# Patient Record
Sex: Female | Born: 1965 | Race: Black or African American | Hispanic: No | Marital: Married | Smoking: Former smoker
Health system: Southern US, Community
[De-identification: ages and names within clinical notes are randomized; demographics above are authoritative.]

## PROBLEM LIST (undated history)

## (undated) DIAGNOSIS — M1990 Unspecified osteoarthritis, unspecified site: Secondary | ICD-10-CM

## (undated) DIAGNOSIS — K219 Gastro-esophageal reflux disease without esophagitis: Secondary | ICD-10-CM

## (undated) DIAGNOSIS — L259 Unspecified contact dermatitis, unspecified cause: Secondary | ICD-10-CM

## (undated) DIAGNOSIS — I10 Essential (primary) hypertension: Secondary | ICD-10-CM

## (undated) DIAGNOSIS — R7303 Prediabetes: Secondary | ICD-10-CM

## (undated) HISTORY — PX: CARPAL TUNNEL RELEASE: SHX101

## (undated) HISTORY — PX: BURN TREATMENT: SHX158

## (undated) HISTORY — DX: Gastro-esophageal reflux disease without esophagitis: K21.9

## (undated) HISTORY — DX: Unspecified contact dermatitis, unspecified cause: L25.9

## (undated) HISTORY — PX: EYE SURGERY: SHX253

---

## 1998-01-25 ENCOUNTER — Encounter: Admission: RE | Admit: 1998-01-25 | Discharge: 1998-01-25 | Payer: Self-pay | Admitting: Internal Medicine

## 1998-07-20 ENCOUNTER — Ambulatory Visit (HOSPITAL_BASED_OUTPATIENT_CLINIC_OR_DEPARTMENT_OTHER): Admission: RE | Admit: 1998-07-20 | Discharge: 1998-07-20 | Payer: Self-pay | Admitting: Ophthalmology

## 1998-07-30 ENCOUNTER — Encounter: Admission: RE | Admit: 1998-07-30 | Discharge: 1998-07-30 | Payer: Self-pay | Admitting: Internal Medicine

## 1998-08-29 ENCOUNTER — Encounter: Admission: RE | Admit: 1998-08-29 | Discharge: 1998-08-29 | Payer: Self-pay | Admitting: Internal Medicine

## 1998-11-04 ENCOUNTER — Emergency Department (HOSPITAL_COMMUNITY): Admission: EM | Admit: 1998-11-04 | Discharge: 1998-11-04 | Payer: Self-pay | Admitting: Emergency Medicine

## 1998-12-10 ENCOUNTER — Emergency Department (HOSPITAL_COMMUNITY): Admission: EM | Admit: 1998-12-10 | Discharge: 1998-12-10 | Payer: Self-pay | Admitting: Emergency Medicine

## 1998-12-10 ENCOUNTER — Encounter: Payer: Self-pay | Admitting: Emergency Medicine

## 1999-01-10 ENCOUNTER — Encounter: Admission: RE | Admit: 1999-01-10 | Discharge: 1999-01-10 | Payer: Self-pay | Admitting: Internal Medicine

## 1999-01-23 ENCOUNTER — Encounter: Admission: RE | Admit: 1999-01-23 | Discharge: 1999-01-23 | Payer: Self-pay | Admitting: Hematology and Oncology

## 1999-02-13 ENCOUNTER — Encounter: Admission: RE | Admit: 1999-02-13 | Discharge: 1999-02-13 | Payer: Self-pay | Admitting: Internal Medicine

## 1999-09-24 ENCOUNTER — Emergency Department (HOSPITAL_COMMUNITY): Admission: EM | Admit: 1999-09-24 | Discharge: 1999-09-24 | Payer: Self-pay | Admitting: Emergency Medicine

## 2000-04-25 ENCOUNTER — Emergency Department (HOSPITAL_COMMUNITY): Admission: EM | Admit: 2000-04-25 | Discharge: 2000-04-25 | Payer: Self-pay

## 2000-05-02 ENCOUNTER — Ambulatory Visit (HOSPITAL_COMMUNITY): Admission: RE | Admit: 2000-05-02 | Discharge: 2000-05-02 | Payer: Self-pay

## 2001-02-21 ENCOUNTER — Emergency Department (HOSPITAL_COMMUNITY): Admission: EM | Admit: 2001-02-21 | Discharge: 2001-02-21 | Payer: Self-pay | Admitting: Emergency Medicine

## 2001-02-21 ENCOUNTER — Encounter: Payer: Self-pay | Admitting: Emergency Medicine

## 2001-09-14 ENCOUNTER — Emergency Department (HOSPITAL_COMMUNITY): Admission: EM | Admit: 2001-09-14 | Discharge: 2001-09-14 | Payer: Self-pay | Admitting: Emergency Medicine

## 2001-09-14 ENCOUNTER — Encounter: Payer: Self-pay | Admitting: Emergency Medicine

## 2002-07-14 ENCOUNTER — Encounter: Admission: RE | Admit: 2002-07-14 | Discharge: 2002-07-14 | Payer: Self-pay | Admitting: Internal Medicine

## 2002-07-15 ENCOUNTER — Encounter: Admission: RE | Admit: 2002-07-15 | Discharge: 2002-07-15 | Payer: Self-pay | Admitting: Internal Medicine

## 2002-08-01 ENCOUNTER — Emergency Department (HOSPITAL_COMMUNITY): Admission: EM | Admit: 2002-08-01 | Discharge: 2002-08-01 | Payer: Self-pay | Admitting: *Deleted

## 2002-09-24 ENCOUNTER — Encounter: Payer: Self-pay | Admitting: Emergency Medicine

## 2002-09-24 ENCOUNTER — Emergency Department (HOSPITAL_COMMUNITY): Admission: EM | Admit: 2002-09-24 | Discharge: 2002-09-24 | Payer: Self-pay | Admitting: Emergency Medicine

## 2003-01-14 ENCOUNTER — Emergency Department (HOSPITAL_COMMUNITY): Admission: EM | Admit: 2003-01-14 | Discharge: 2003-01-15 | Payer: Self-pay | Admitting: Emergency Medicine

## 2003-02-01 ENCOUNTER — Encounter: Admission: RE | Admit: 2003-02-01 | Discharge: 2003-02-01 | Payer: Self-pay | Admitting: Internal Medicine

## 2003-06-20 ENCOUNTER — Encounter: Admission: RE | Admit: 2003-06-20 | Discharge: 2003-06-20 | Payer: Self-pay | Admitting: Internal Medicine

## 2003-08-04 ENCOUNTER — Encounter (INDEPENDENT_AMBULATORY_CARE_PROVIDER_SITE_OTHER): Payer: Self-pay | Admitting: *Deleted

## 2003-08-04 ENCOUNTER — Encounter: Admission: RE | Admit: 2003-08-04 | Discharge: 2003-08-04 | Payer: Self-pay | Admitting: Family Medicine

## 2003-10-20 ENCOUNTER — Emergency Department (HOSPITAL_COMMUNITY): Admission: EM | Admit: 2003-10-20 | Discharge: 2003-10-20 | Payer: Self-pay

## 2004-01-08 ENCOUNTER — Encounter: Admission: RE | Admit: 2004-01-08 | Discharge: 2004-01-08 | Payer: Self-pay | Admitting: Internal Medicine

## 2004-01-19 ENCOUNTER — Emergency Department (HOSPITAL_COMMUNITY): Admission: EM | Admit: 2004-01-19 | Discharge: 2004-01-19 | Payer: Self-pay | Admitting: Emergency Medicine

## 2004-02-07 ENCOUNTER — Emergency Department (HOSPITAL_COMMUNITY): Admission: EM | Admit: 2004-02-07 | Discharge: 2004-02-07 | Payer: Self-pay

## 2004-02-09 ENCOUNTER — Emergency Department (HOSPITAL_COMMUNITY): Admission: EM | Admit: 2004-02-09 | Discharge: 2004-02-09 | Payer: Self-pay | Admitting: Emergency Medicine

## 2004-02-12 ENCOUNTER — Emergency Department (HOSPITAL_COMMUNITY): Admission: EM | Admit: 2004-02-12 | Discharge: 2004-02-12 | Payer: Self-pay | Admitting: Emergency Medicine

## 2004-02-16 ENCOUNTER — Encounter: Admission: RE | Admit: 2004-02-16 | Discharge: 2004-02-16 | Payer: Self-pay | Admitting: Internal Medicine

## 2004-07-25 ENCOUNTER — Ambulatory Visit: Payer: Self-pay | Admitting: Internal Medicine

## 2004-08-19 ENCOUNTER — Ambulatory Visit: Payer: Self-pay | Admitting: Internal Medicine

## 2004-08-29 ENCOUNTER — Ambulatory Visit (HOSPITAL_COMMUNITY): Admission: RE | Admit: 2004-08-29 | Discharge: 2004-08-29 | Payer: Self-pay | Admitting: Internal Medicine

## 2004-09-21 ENCOUNTER — Emergency Department (HOSPITAL_COMMUNITY): Admission: EM | Admit: 2004-09-21 | Discharge: 2004-09-21 | Payer: Self-pay

## 2004-11-19 ENCOUNTER — Ambulatory Visit: Payer: Self-pay | Admitting: Internal Medicine

## 2004-12-04 ENCOUNTER — Ambulatory Visit: Payer: Self-pay | Admitting: Internal Medicine

## 2005-01-18 ENCOUNTER — Emergency Department (HOSPITAL_COMMUNITY): Admission: EM | Admit: 2005-01-18 | Discharge: 2005-01-18 | Payer: Self-pay | Admitting: Emergency Medicine

## 2005-04-18 IMAGING — US US ABDOMEN COMPLETE
1 series · 14 of 25 positions shown · non-contrast
Comparison: none

CLINICAL DATA: Abdominal pain.

[Series 1: unknown · 0.33mm/px · 14 of 59 slices shown]
[im 1/59]
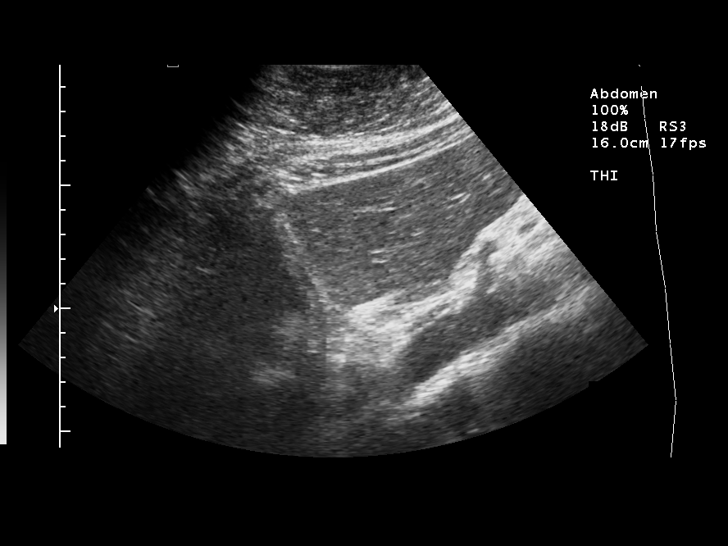
[im 5/59]
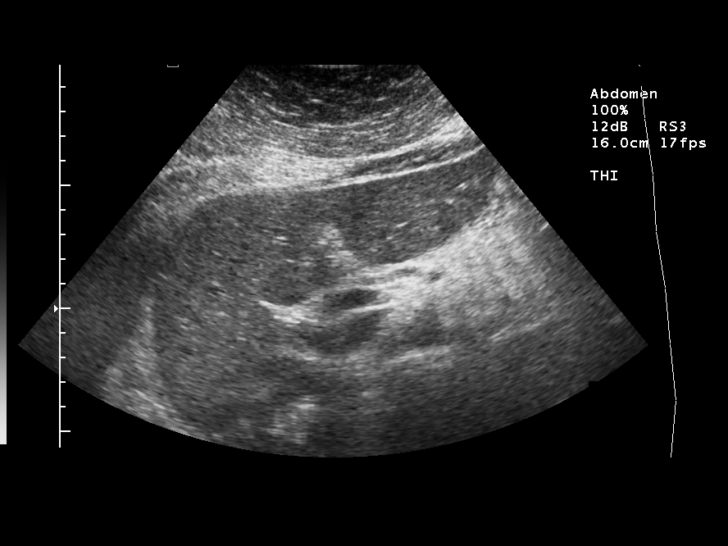
[im 10/59]
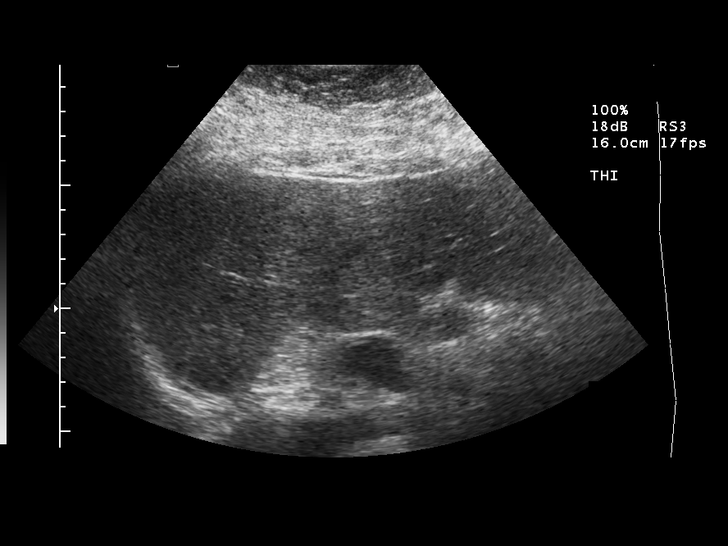
[im 15/59]
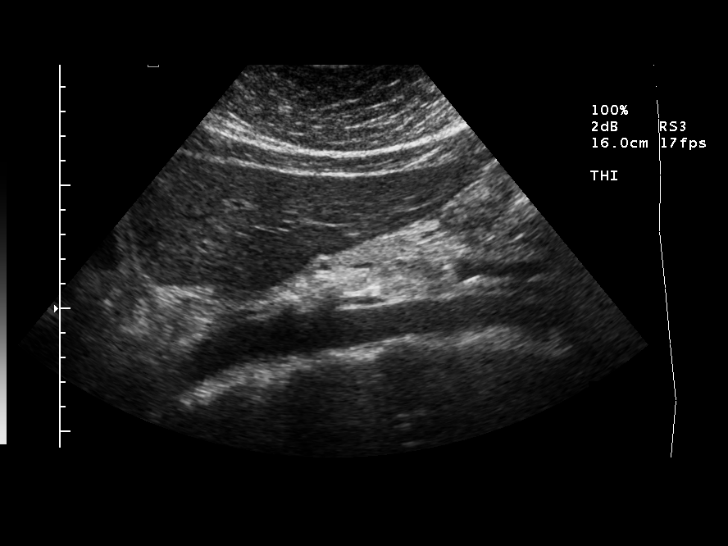
[im 20/59]
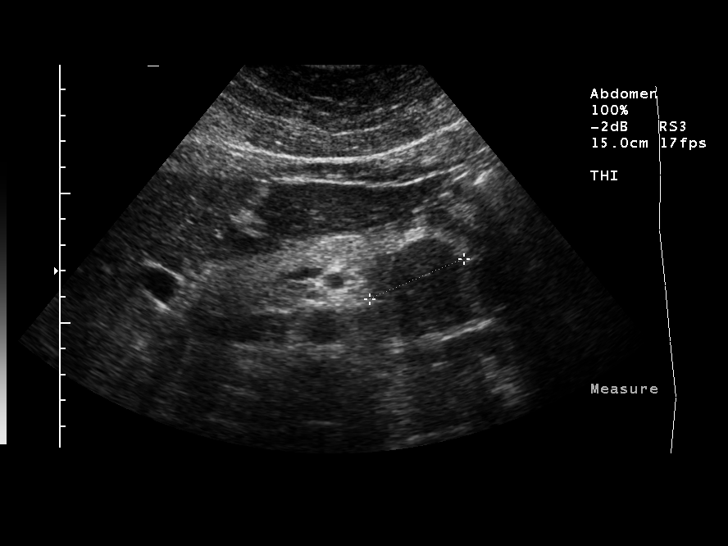
[im 22/59]
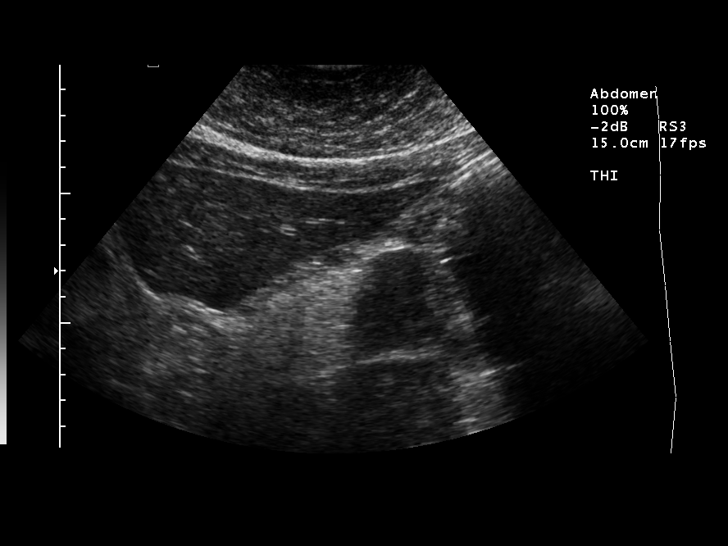
[im 27/59]
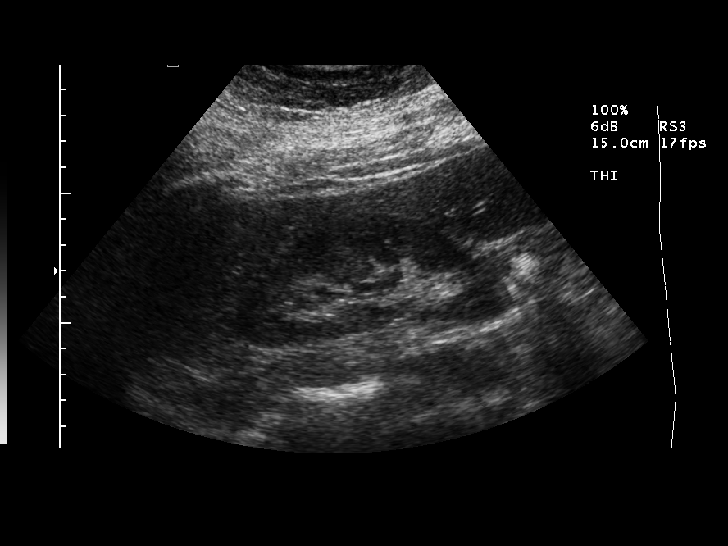
[im 32/59]
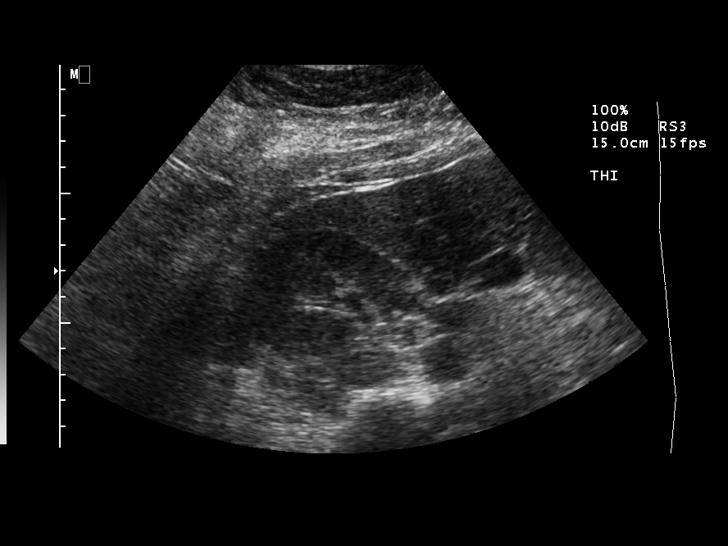
[im 37/59]
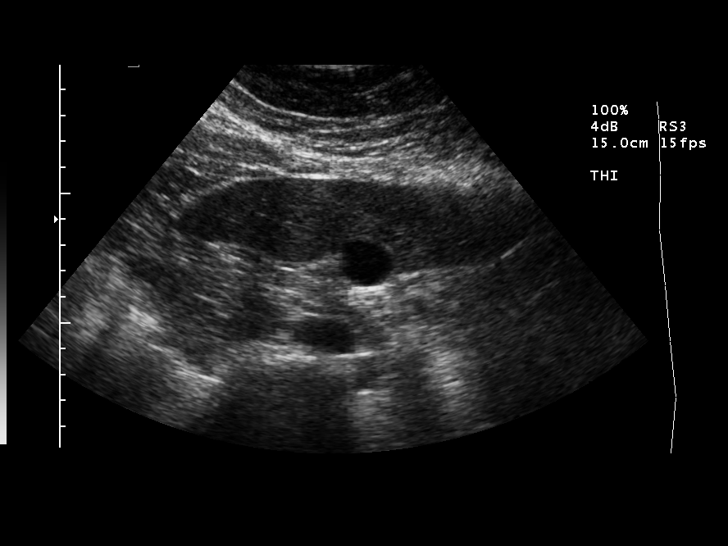
[im 39/59]
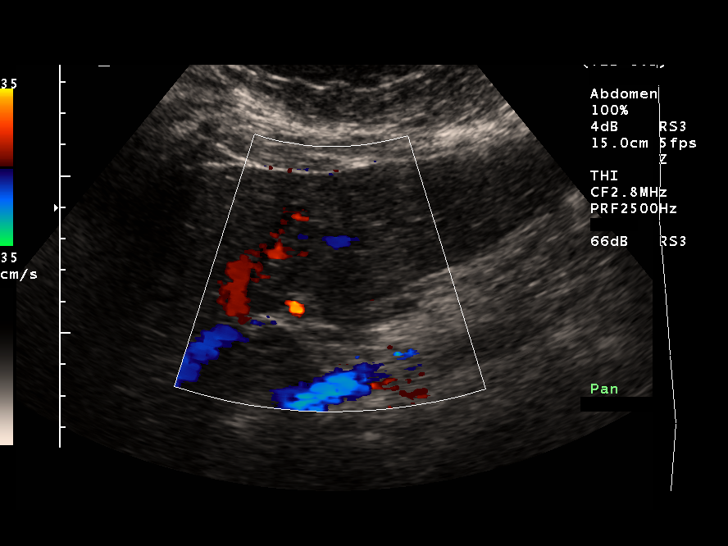
[im 44/59]
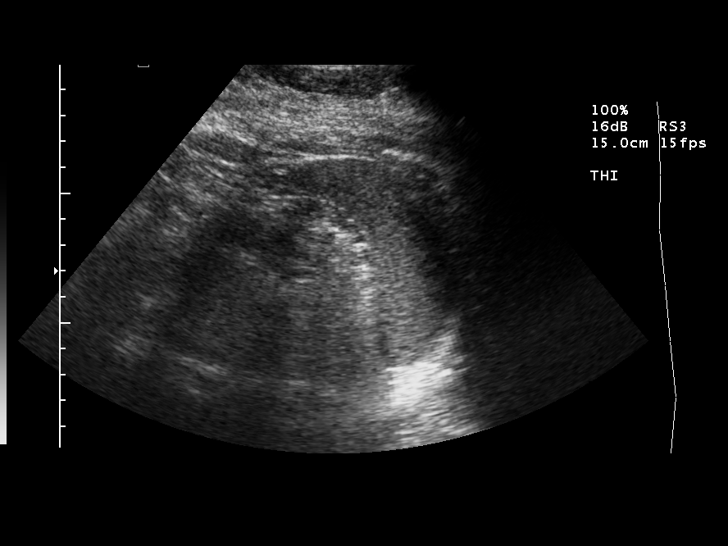
[im 49/59]
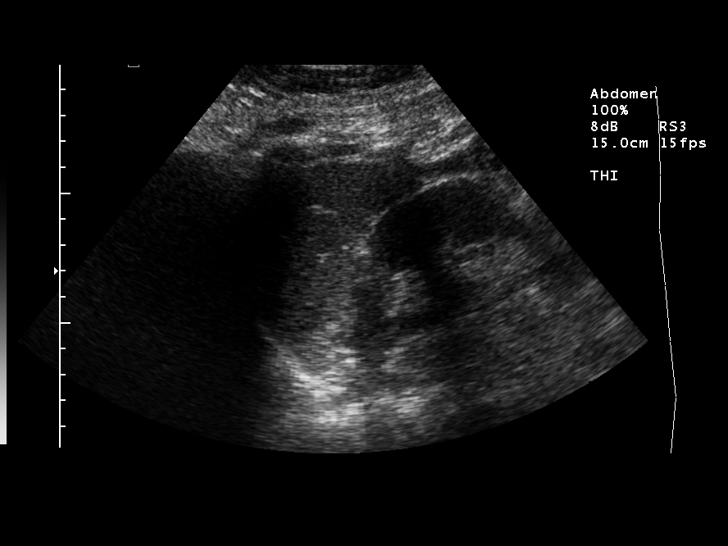
[im 54/59]
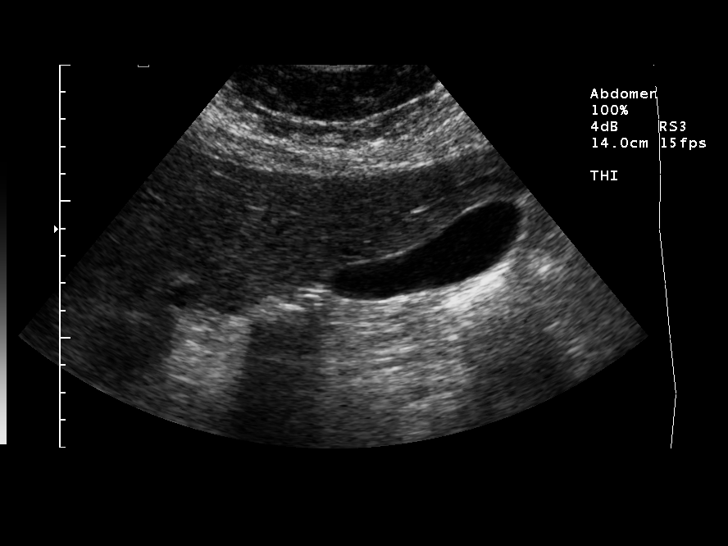
[im 59/59]
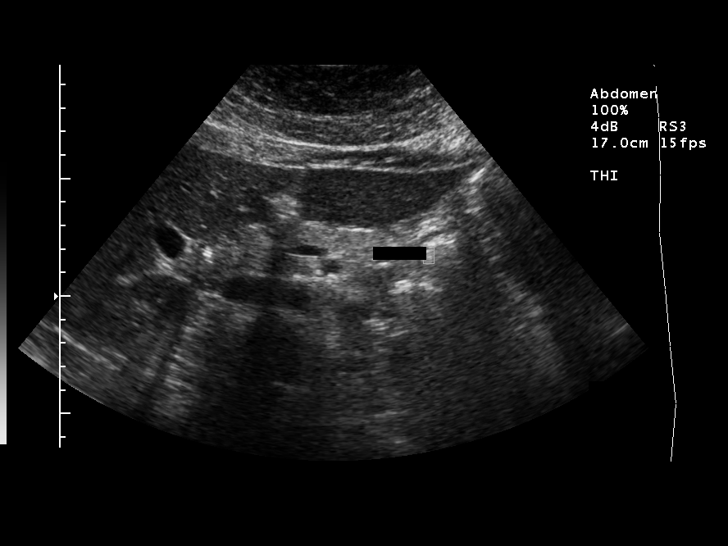

[14 of 25 positions shown; findings below may reference images not displayed]

ABDOMINAL ULTRASOUND:

 There is no evidence of gallstones or gallbladder wall thickening. There is no evidence of biliary ductal dilatation. The liver is within normal limits in echogenicity and no focal parenchymal lesions are identified. The visualized portion of the pancreas is unremarkable in appearance. 

 The kidneys are within normal limits in size and echogenicity and there is no evidence of masses or hydronephrosis.  There is no evidence of splenomegaly or abdominal aortic aneurysm.  Images of the inferior vena cava are unremarkable, and there is no evidence of ascites.
IMPRESSION: Negative abdominal ultrasound.

## 2005-05-11 IMAGING — CR DG KNEE COMPLETE 4+V*L*
4 series · 4 of 4 positions shown · non-contrast
Comparison: none

CLINICAL DATA: Fell last night and twisted the knee.  Pain in anterior aspect of the knee.
 LEFT KNEE COMPLETE:
 Four views of the left knee show no evidence of acute fracture, dislocation, or foreign body.  There is slight irregularity associated with the articular surface of the anterior aspect of the medial condyle and there is a suggestion of a small joint effusion.

[view not recorded (1 of 4)]
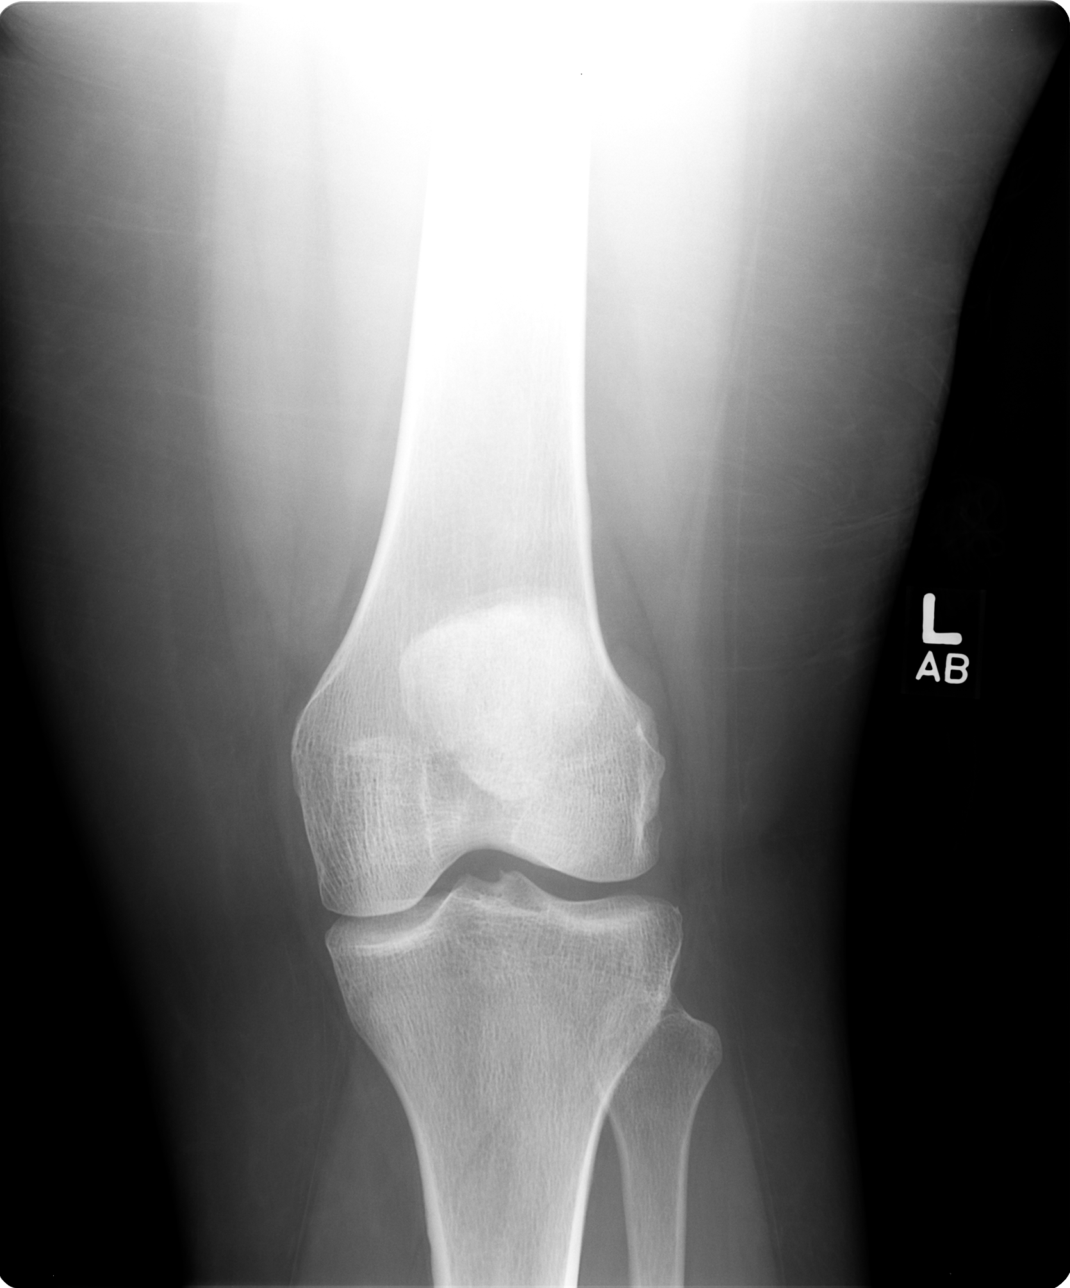

[view not recorded (2 of 4)]
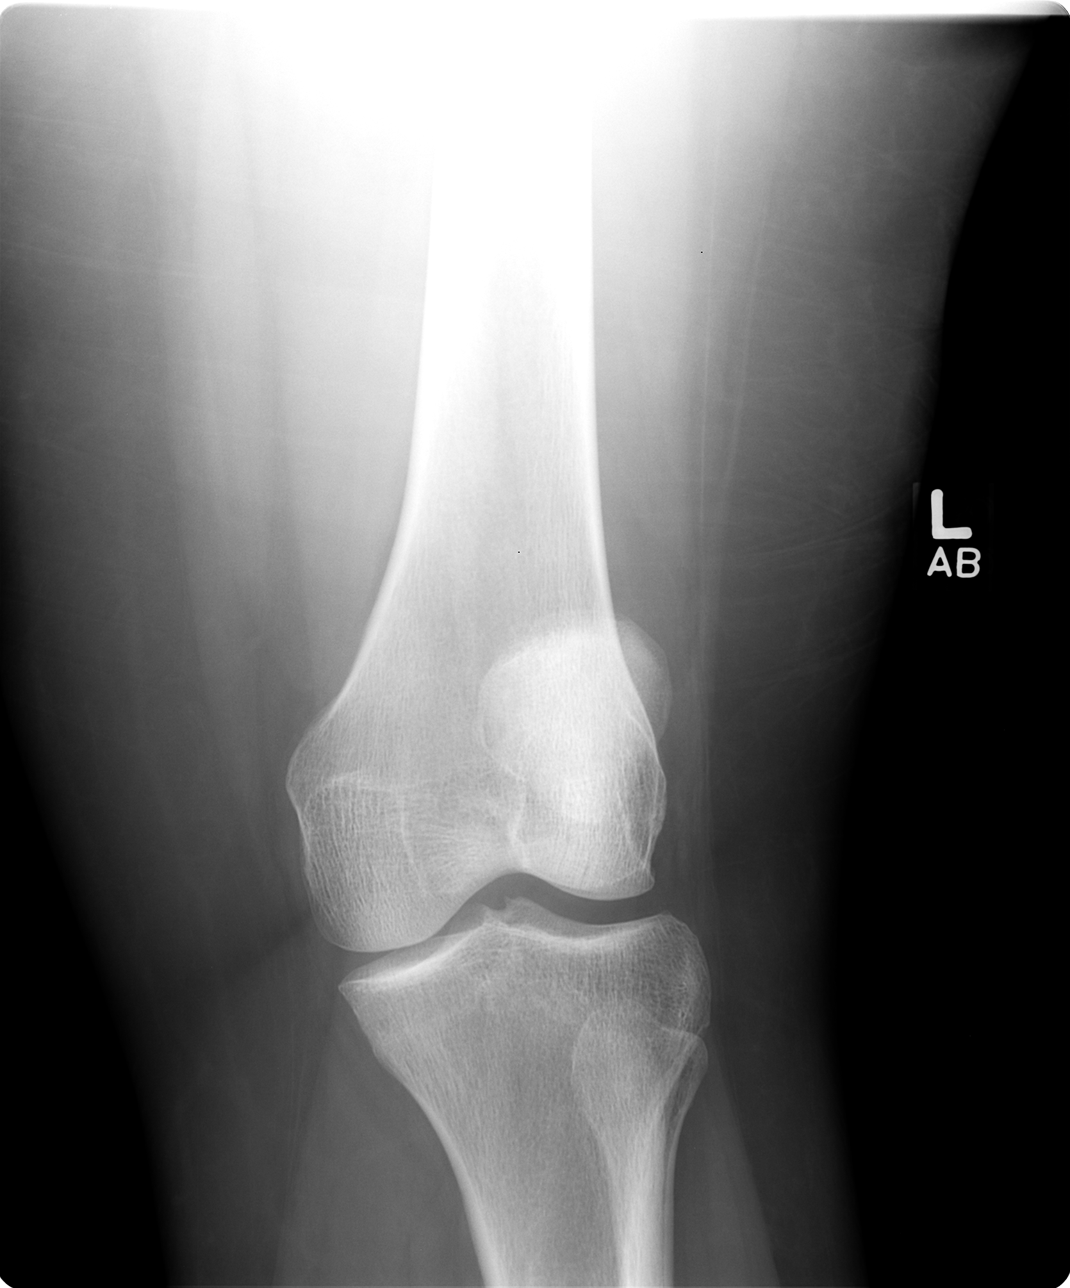

[view not recorded (3 of 4)]
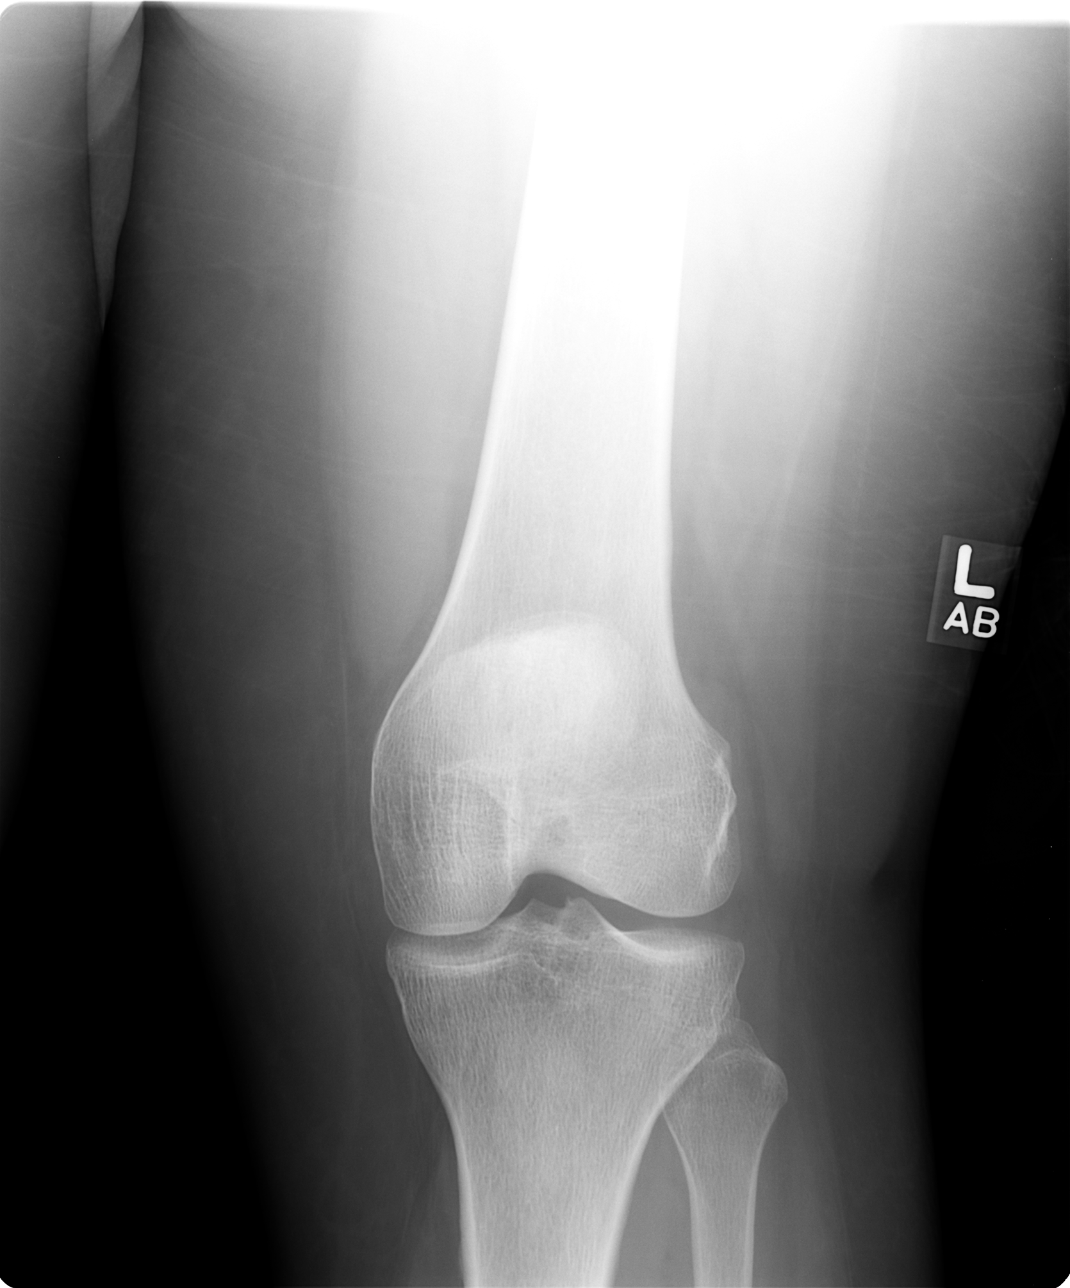

[view not recorded (4 of 4)]
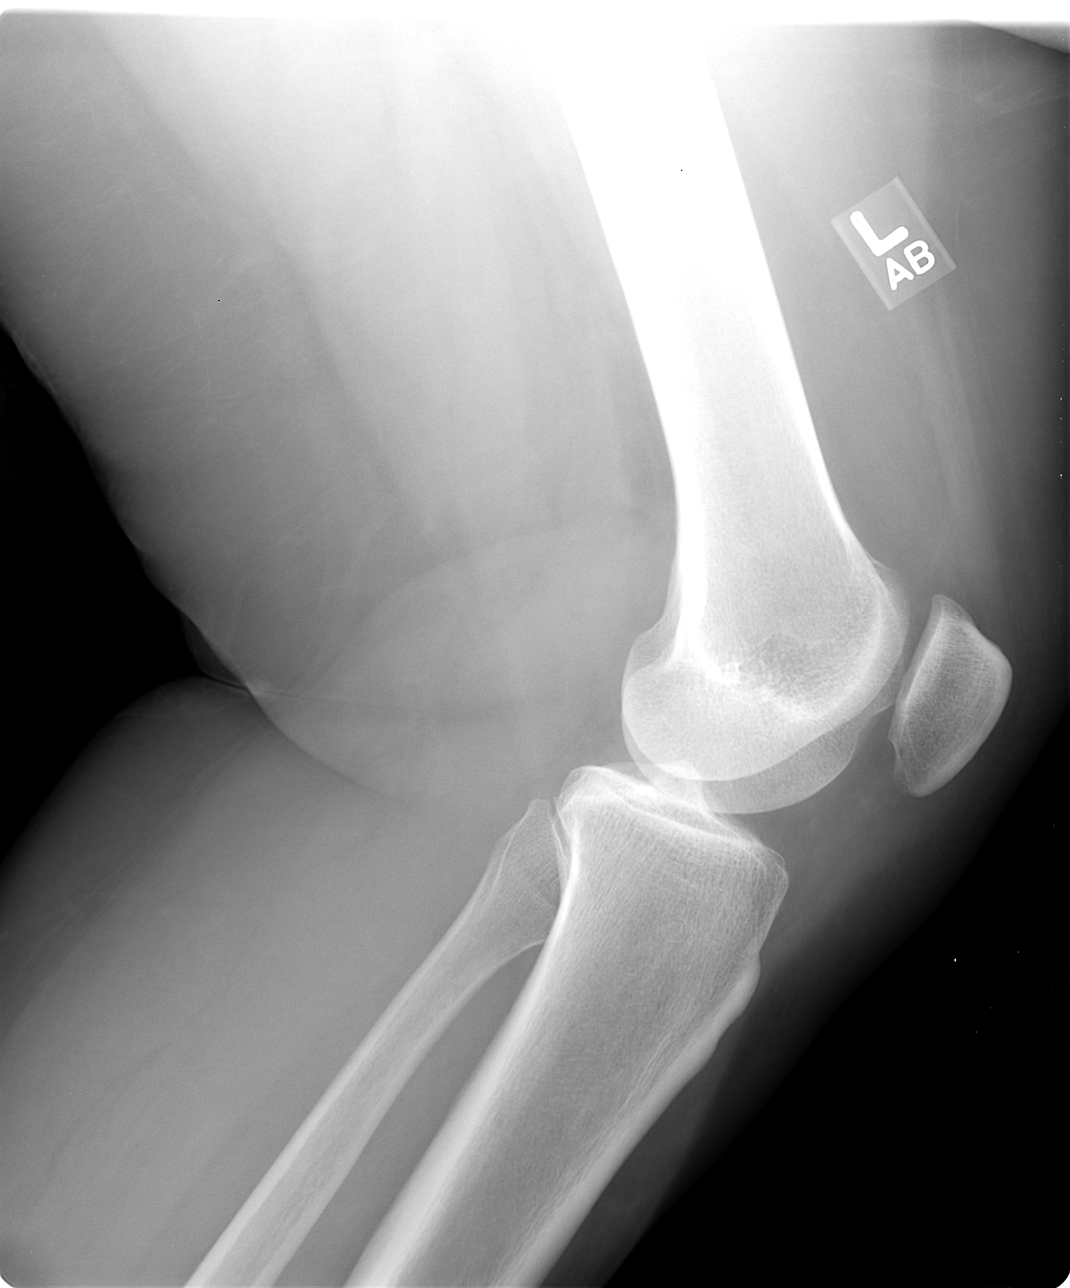

[4 of 4 positions shown; findings below may reference images not displayed]

IMPRESSION: Irregularity of the anterior aspect of the medial condyle of the left femur with a small joint effusion.  Probable chronic change.

## 2005-07-04 ENCOUNTER — Emergency Department (HOSPITAL_COMMUNITY): Admission: EM | Admit: 2005-07-04 | Discharge: 2005-07-04 | Payer: Self-pay | Admitting: Emergency Medicine

## 2005-11-28 ENCOUNTER — Emergency Department (HOSPITAL_COMMUNITY): Admission: EM | Admit: 2005-11-28 | Discharge: 2005-11-28 | Payer: Self-pay | Admitting: Emergency Medicine

## 2005-12-15 ENCOUNTER — Emergency Department (HOSPITAL_COMMUNITY): Admission: EM | Admit: 2005-12-15 | Discharge: 2005-12-15 | Payer: Self-pay | Admitting: Emergency Medicine

## 2006-08-01 ENCOUNTER — Emergency Department (HOSPITAL_COMMUNITY): Admission: EM | Admit: 2006-08-01 | Discharge: 2006-08-01 | Payer: Self-pay | Admitting: Emergency Medicine

## 2006-08-03 ENCOUNTER — Ambulatory Visit: Payer: Self-pay | Admitting: Hospitalist

## 2006-10-13 ENCOUNTER — Encounter (INDEPENDENT_AMBULATORY_CARE_PROVIDER_SITE_OTHER): Payer: Self-pay | Admitting: Internal Medicine

## 2006-10-25 ENCOUNTER — Emergency Department (HOSPITAL_COMMUNITY): Admission: EM | Admit: 2006-10-25 | Discharge: 2006-10-25 | Payer: Self-pay | Admitting: Emergency Medicine

## 2006-11-12 ENCOUNTER — Encounter (INDEPENDENT_AMBULATORY_CARE_PROVIDER_SITE_OTHER): Payer: Self-pay | Admitting: Unknown Physician Specialty

## 2006-11-28 ENCOUNTER — Emergency Department (HOSPITAL_COMMUNITY): Admission: EM | Admit: 2006-11-28 | Discharge: 2006-11-28 | Payer: Self-pay | Admitting: Emergency Medicine

## 2007-02-15 ENCOUNTER — Encounter (INDEPENDENT_AMBULATORY_CARE_PROVIDER_SITE_OTHER): Payer: Self-pay | Admitting: Internal Medicine

## 2007-07-04 ENCOUNTER — Emergency Department (HOSPITAL_COMMUNITY): Admission: EM | Admit: 2007-07-04 | Discharge: 2007-07-04 | Payer: Self-pay | Admitting: Emergency Medicine

## 2007-08-08 ENCOUNTER — Emergency Department (HOSPITAL_COMMUNITY): Admission: EM | Admit: 2007-08-08 | Discharge: 2007-08-08 | Payer: Self-pay | Admitting: Emergency Medicine

## 2007-09-10 ENCOUNTER — Telehealth (INDEPENDENT_AMBULATORY_CARE_PROVIDER_SITE_OTHER): Payer: Self-pay | Admitting: *Deleted

## 2007-09-15 ENCOUNTER — Ambulatory Visit (HOSPITAL_COMMUNITY): Admission: RE | Admit: 2007-09-15 | Discharge: 2007-09-15 | Payer: Self-pay | Admitting: Internal Medicine

## 2007-09-15 ENCOUNTER — Encounter (INDEPENDENT_AMBULATORY_CARE_PROVIDER_SITE_OTHER): Payer: Self-pay | Admitting: Infectious Diseases

## 2007-09-15 ENCOUNTER — Encounter (INDEPENDENT_AMBULATORY_CARE_PROVIDER_SITE_OTHER): Payer: Self-pay | Admitting: *Deleted

## 2007-09-15 ENCOUNTER — Ambulatory Visit: Payer: Self-pay | Admitting: Internal Medicine

## 2007-09-15 DIAGNOSIS — J45909 Unspecified asthma, uncomplicated: Secondary | ICD-10-CM | POA: Insufficient documentation

## 2007-09-15 DIAGNOSIS — I10 Essential (primary) hypertension: Secondary | ICD-10-CM

## 2007-09-15 LAB — CONVERTED CEMR LAB
ALT: 9 units/L (ref 0–35)
AST: 11 units/L (ref 0–37)
Albumin: 3.7 g/dL (ref 3.5–5.2)
Alkaline Phosphatase: 57 units/L (ref 39–117)
BUN: 10 mg/dL (ref 6–23)
CO2: 24 meq/L (ref 19–32)
Calcium: 8.7 mg/dL (ref 8.4–10.5)
Chloride: 104 meq/L (ref 96–112)
Cholesterol: 150 mg/dL (ref 0–200)
Creatinine, Ser: 0.85 mg/dL (ref 0.40–1.20)
Glucose, Bld: 98 mg/dL (ref 70–99)
HCT: 36.5 % (ref 36.0–46.0)
HDL: 50 mg/dL (ref >39)
Hemoglobin: 11.7 g/dL — ABNORMAL LOW (ref 12.0–15.0)
LDL Cholesterol: 91 mg/dL (ref 0–99)
MCHC: 32.1 g/dL (ref 30.0–36.0)
MCV: 83.9 fL (ref 78.0–100.0)
Platelets: 296 10*3/uL (ref 150–400)
Potassium: 4.5 meq/L (ref 3.5–5.3)
RBC: 4.35 M/uL (ref 3.87–5.11)
RDW: 15.8 % — ABNORMAL HIGH (ref 11.5–15.5)
Sodium: 139 meq/L (ref 135–145)
TSH: 1.172 microintl units/mL (ref 0.350–5.50)
Total Bilirubin: 0.6 mg/dL (ref 0.3–1.2)
Total CHOL/HDL Ratio: 3
Total Protein: 7 g/dL (ref 6.0–8.3)
Triglycerides: 45 mg/dL (ref <150)
VLDL: 9 mg/dL (ref 0–40)
WBC: 6.7 10*3/uL (ref 4.0–10.5)

## 2007-09-16 ENCOUNTER — Encounter (INDEPENDENT_AMBULATORY_CARE_PROVIDER_SITE_OTHER): Payer: Self-pay | Admitting: Infectious Diseases

## 2008-02-06 ENCOUNTER — Emergency Department (HOSPITAL_COMMUNITY): Admission: EM | Admit: 2008-02-06 | Discharge: 2008-02-06 | Payer: Self-pay | Admitting: Emergency Medicine

## 2008-05-31 ENCOUNTER — Ambulatory Visit: Payer: Self-pay | Admitting: Internal Medicine

## 2008-05-31 ENCOUNTER — Encounter: Payer: Self-pay | Admitting: Internal Medicine

## 2008-05-31 ENCOUNTER — Encounter (INDEPENDENT_AMBULATORY_CARE_PROVIDER_SITE_OTHER): Payer: Self-pay | Admitting: Internal Medicine

## 2008-05-31 LAB — CONVERTED CEMR LAB
ALT: 11 units/L (ref 0–35)
AST: 11 units/L (ref 0–37)
Albumin: 3.6 g/dL (ref 3.5–5.2)
Alkaline Phosphatase: 59 units/L (ref 39–117)
BUN: 13 mg/dL (ref 6–23)
CO2: 21 meq/L (ref 19–32)
Calcium: 8.7 mg/dL (ref 8.4–10.5)
Chloride: 107 meq/L (ref 96–112)
Cholesterol: 141 mg/dL (ref 0–200)
Creatinine, Ser: 0.84 mg/dL (ref 0.40–1.20)
Glucose, Bld: 86 mg/dL (ref 70–99)
HDL: 44 mg/dL (ref >39)
LDL Cholesterol: 88 mg/dL (ref 0–99)
Potassium: 4.8 meq/L (ref 3.5–5.3)
Sodium: 140 meq/L (ref 135–145)
TSH: 1.619 microintl units/mL (ref 0.350–4.50)
Total Bilirubin: 0.4 mg/dL (ref 0.3–1.2)
Total CHOL/HDL Ratio: 3.2
Total Protein: 6.9 g/dL (ref 6.0–8.3)
Triglycerides: 43 mg/dL (ref <150)
VLDL: 9 mg/dL (ref 0–40)

## 2008-07-13 ENCOUNTER — Encounter (INDEPENDENT_AMBULATORY_CARE_PROVIDER_SITE_OTHER): Payer: Self-pay | Admitting: Internal Medicine

## 2008-07-13 ENCOUNTER — Ambulatory Visit (HOSPITAL_BASED_OUTPATIENT_CLINIC_OR_DEPARTMENT_OTHER): Admission: RE | Admit: 2008-07-13 | Discharge: 2008-07-13 | Payer: Self-pay | Admitting: Internal Medicine

## 2008-07-15 ENCOUNTER — Ambulatory Visit: Payer: Self-pay | Admitting: Internal Medicine

## 2008-08-08 ENCOUNTER — Telehealth (INDEPENDENT_AMBULATORY_CARE_PROVIDER_SITE_OTHER): Payer: Self-pay | Admitting: Internal Medicine

## 2008-08-09 ENCOUNTER — Ambulatory Visit: Payer: Self-pay | Admitting: *Deleted

## 2008-08-11 ENCOUNTER — Ambulatory Visit: Payer: Self-pay | Admitting: *Deleted

## 2008-08-11 ENCOUNTER — Encounter (INDEPENDENT_AMBULATORY_CARE_PROVIDER_SITE_OTHER): Payer: Self-pay | Admitting: Internal Medicine

## 2008-09-05 ENCOUNTER — Emergency Department (HOSPITAL_COMMUNITY): Admission: EM | Admit: 2008-09-05 | Discharge: 2008-09-06 | Payer: Self-pay | Admitting: Emergency Medicine

## 2008-09-08 ENCOUNTER — Ambulatory Visit: Payer: Self-pay | Admitting: Internal Medicine

## 2008-09-08 DIAGNOSIS — S93409A Sprain of unspecified ligament of unspecified ankle, initial encounter: Secondary | ICD-10-CM | POA: Insufficient documentation

## 2008-09-18 ENCOUNTER — Ambulatory Visit: Payer: Self-pay | Admitting: Infectious Diseases

## 2008-09-19 ENCOUNTER — Telehealth: Payer: Self-pay | Admitting: *Deleted

## 2008-10-03 ENCOUNTER — Telehealth: Payer: Self-pay | Admitting: *Deleted

## 2008-10-11 ENCOUNTER — Ambulatory Visit: Payer: Self-pay | Admitting: Internal Medicine

## 2008-10-11 DIAGNOSIS — E669 Obesity, unspecified: Secondary | ICD-10-CM | POA: Insufficient documentation

## 2008-10-11 HISTORY — DX: Obesity, unspecified: E66.9

## 2008-10-18 ENCOUNTER — Encounter (INDEPENDENT_AMBULATORY_CARE_PROVIDER_SITE_OTHER): Payer: Self-pay | Admitting: Internal Medicine

## 2008-10-23 ENCOUNTER — Encounter (INDEPENDENT_AMBULATORY_CARE_PROVIDER_SITE_OTHER): Payer: Self-pay | Admitting: Internal Medicine

## 2008-10-24 ENCOUNTER — Ambulatory Visit: Payer: Self-pay | Admitting: Internal Medicine

## 2008-10-27 ENCOUNTER — Telehealth: Payer: Self-pay | Admitting: *Deleted

## 2008-11-28 ENCOUNTER — Encounter (INDEPENDENT_AMBULATORY_CARE_PROVIDER_SITE_OTHER): Payer: Self-pay | Admitting: Internal Medicine

## 2008-11-28 ENCOUNTER — Ambulatory Visit: Payer: Self-pay | Admitting: Internal Medicine

## 2008-11-29 LAB — CONVERTED CEMR LAB
ALT: 12 units/L (ref 0–35)
AST: 11 units/L (ref 0–37)
Albumin: 3.7 g/dL (ref 3.5–5.2)
Alkaline Phosphatase: 60 units/L (ref 39–117)
BUN: 15 mg/dL (ref 6–23)
CO2: 24 meq/L (ref 19–32)
Calcium: 8.6 mg/dL (ref 8.4–10.5)
Chloride: 101 meq/L (ref 96–112)
Cholesterol: 148 mg/dL (ref 0–200)
Creatinine, Ser: 0.94 mg/dL (ref 0.40–1.20)
Creatinine, Urine: 259.8 mg/dL
Glucose, Bld: 93 mg/dL (ref 70–99)
HDL: 49 mg/dL (ref >39)
LDL Cholesterol: 85 mg/dL (ref 0–99)
Microalb Creat Ratio: 14.6 mg/g (ref 0.0–30.0)
Microalb, Ur: 3.79 mg/dL — ABNORMAL HIGH (ref 0.00–1.89)
Potassium: 3.6 meq/L (ref 3.5–5.3)
Sodium: 140 meq/L (ref 135–145)
Total Bilirubin: 0.9 mg/dL (ref 0.3–1.2)
Total CHOL/HDL Ratio: 3
Total Protein: 7.3 g/dL (ref 6.0–8.3)
Triglycerides: 70 mg/dL (ref <150)
VLDL: 14 mg/dL (ref 0–40)

## 2008-12-07 ENCOUNTER — Encounter (INDEPENDENT_AMBULATORY_CARE_PROVIDER_SITE_OTHER): Payer: Self-pay | Admitting: *Deleted

## 2008-12-07 ENCOUNTER — Ambulatory Visit: Payer: Self-pay | Admitting: Family Medicine

## 2008-12-07 DIAGNOSIS — G5750 Tarsal tunnel syndrome, unspecified lower limb: Secondary | ICD-10-CM

## 2008-12-08 ENCOUNTER — Ambulatory Visit: Payer: Self-pay | Admitting: Family Medicine

## 2008-12-08 DIAGNOSIS — Q6650 Congenital pes planus, unspecified foot: Secondary | ICD-10-CM | POA: Insufficient documentation

## 2008-12-08 HISTORY — DX: Congenital pes planus, unspecified foot: Q66.50

## 2009-01-02 ENCOUNTER — Ambulatory Visit: Payer: Self-pay | Admitting: Sports Medicine

## 2009-01-19 ENCOUNTER — Ambulatory Visit: Payer: Self-pay | Admitting: Internal Medicine

## 2009-01-19 DIAGNOSIS — J309 Allergic rhinitis, unspecified: Secondary | ICD-10-CM

## 2009-01-19 HISTORY — DX: Allergic rhinitis, unspecified: J30.9

## 2009-02-02 ENCOUNTER — Ambulatory Visit: Payer: Self-pay | Admitting: Infectious Disease

## 2009-02-05 ENCOUNTER — Encounter: Payer: Self-pay | Admitting: Sports Medicine

## 2009-02-06 ENCOUNTER — Ambulatory Visit (HOSPITAL_COMMUNITY): Admission: RE | Admit: 2009-02-06 | Discharge: 2009-02-06 | Payer: Self-pay | Admitting: Internal Medicine

## 2009-02-15 ENCOUNTER — Ambulatory Visit: Payer: Self-pay | Admitting: Sports Medicine

## 2009-02-16 ENCOUNTER — Encounter: Payer: Self-pay | Admitting: Sports Medicine

## 2009-02-16 ENCOUNTER — Encounter (INDEPENDENT_AMBULATORY_CARE_PROVIDER_SITE_OTHER): Payer: Self-pay | Admitting: Internal Medicine

## 2009-03-09 ENCOUNTER — Ambulatory Visit: Payer: Self-pay | Admitting: Internal Medicine

## 2009-03-09 DIAGNOSIS — L259 Unspecified contact dermatitis, unspecified cause: Secondary | ICD-10-CM

## 2009-03-09 DIAGNOSIS — K219 Gastro-esophageal reflux disease without esophagitis: Secondary | ICD-10-CM

## 2009-03-09 HISTORY — DX: Gastro-esophageal reflux disease without esophagitis: K21.9

## 2009-03-09 HISTORY — DX: Unspecified contact dermatitis, unspecified cause: L25.9

## 2009-03-13 ENCOUNTER — Ambulatory Visit: Payer: Self-pay | Admitting: Sports Medicine

## 2009-03-21 ENCOUNTER — Encounter: Payer: Self-pay | Admitting: Sports Medicine

## 2009-03-28 ENCOUNTER — Encounter: Payer: Self-pay | Admitting: Sports Medicine

## 2009-03-29 ENCOUNTER — Encounter (INDEPENDENT_AMBULATORY_CARE_PROVIDER_SITE_OTHER): Payer: Self-pay | Admitting: *Deleted

## 2009-04-24 ENCOUNTER — Ambulatory Visit: Payer: Self-pay | Admitting: Sports Medicine

## 2009-07-16 ENCOUNTER — Encounter: Payer: Self-pay | Admitting: Sports Medicine

## 2009-08-07 ENCOUNTER — Telehealth: Payer: Self-pay | Admitting: *Deleted

## 2009-09-14 ENCOUNTER — Encounter: Payer: Self-pay | Admitting: Sports Medicine

## 2009-09-18 ENCOUNTER — Ambulatory Visit: Payer: Self-pay | Admitting: Sports Medicine

## 2009-09-18 ENCOUNTER — Encounter: Payer: Self-pay | Admitting: Family Medicine

## 2009-12-19 ENCOUNTER — Telehealth (INDEPENDENT_AMBULATORY_CARE_PROVIDER_SITE_OTHER): Payer: Self-pay | Admitting: Internal Medicine

## 2010-01-28 ENCOUNTER — Ambulatory Visit: Payer: Self-pay | Admitting: Internal Medicine

## 2010-01-28 LAB — CONVERTED CEMR LAB: Hgb A1c MFr Bld: 5.8 %

## 2010-01-29 LAB — CONVERTED CEMR LAB
ALT: 15 units/L (ref 0–35)
AST: 11 units/L (ref 0–37)
Albumin: 3.7 g/dL (ref 3.5–5.2)
Alkaline Phosphatase: 58 units/L (ref 39–117)
BUN: 16 mg/dL (ref 6–23)
Basophils Absolute: 0 10*3/uL (ref 0.0–0.1)
Basophils Relative: 0 % (ref 0–1)
CO2: 23 meq/L (ref 19–32)
Calcium: 9.1 mg/dL (ref 8.4–10.5)
Chloride: 102 meq/L (ref 96–112)
Cholesterol: 160 mg/dL (ref 0–200)
Creatinine, Ser: 0.78 mg/dL (ref 0.40–1.20)
Eosinophils Absolute: 0.4 10*3/uL (ref 0.0–0.7)
Eosinophils Relative: 4 % (ref 0–5)
Glucose, Bld: 79 mg/dL (ref 70–99)
HCT: 37.3 % (ref 36.0–46.0)
HDL: 53 mg/dL (ref >39)
Hemoglobin: 11.4 g/dL — ABNORMAL LOW (ref 12.0–15.0)
LDL Cholesterol: 94 mg/dL (ref 0–99)
Lymphocytes Relative: 21 % (ref 12–46)
Lymphs Abs: 2.2 10*3/uL (ref 0.7–4.0)
MCHC: 30.6 g/dL (ref 30.0–36.0)
MCV: 85.7 fL (ref >=78.0)
Monocytes Absolute: 0.8 10*3/uL (ref 0.1–1.0)
Monocytes Relative: 7 % (ref 3–12)
Neutro Abs: 7.3 10*3/uL (ref 1.7–7.7)
Neutrophils Relative %: 68 % (ref 43–77)
Platelets: 315 10*3/uL (ref 150–400)
Potassium: 4.3 meq/L (ref 3.5–5.3)
RBC: 4.35 M/uL (ref 3.87–5.11)
RDW: 16.2 % — ABNORMAL HIGH (ref 11.5–15.5)
Sodium: 138 meq/L (ref 135–145)
Total Bilirubin: 0.5 mg/dL (ref 0.3–1.2)
Total CHOL/HDL Ratio: 3
Total Protein: 7 g/dL (ref 6.0–8.3)
Triglycerides: 63 mg/dL (ref <150)
VLDL: 13 mg/dL (ref 0–40)
WBC: 10.8 10*3/uL — ABNORMAL HIGH (ref 4.0–10.5)

## 2010-06-11 ENCOUNTER — Telehealth: Payer: Self-pay | Admitting: *Deleted

## 2010-06-24 ENCOUNTER — Ambulatory Visit (HOSPITAL_COMMUNITY): Admission: RE | Admit: 2010-06-24 | Discharge: 2010-06-24 | Payer: Self-pay | Admitting: Internal Medicine

## 2010-07-08 ENCOUNTER — Ambulatory Visit: Payer: Self-pay | Admitting: Internal Medicine

## 2010-07-08 DIAGNOSIS — D649 Anemia, unspecified: Secondary | ICD-10-CM | POA: Insufficient documentation

## 2010-07-08 DIAGNOSIS — M25569 Pain in unspecified knee: Secondary | ICD-10-CM | POA: Insufficient documentation

## 2010-07-09 LAB — CONVERTED CEMR LAB
HCT: 32.8 % — ABNORMAL LOW (ref 36.0–46.0)
Hemoglobin: 10.5 g/dL — ABNORMAL LOW (ref 12.0–15.0)
MCHC: 32 g/dL (ref 30.0–36.0)
MCV: 81.4 fL (ref >=78.0)
Platelets: 304 10*3/uL (ref 150–400)
RBC: 4.03 M/uL (ref 3.87–5.11)
RDW: 16.2 % — ABNORMAL HIGH (ref 11.5–15.5)
WBC: 7.1 10*3/uL (ref 4.0–10.5)

## 2010-09-05 ENCOUNTER — Ambulatory Visit: Payer: Self-pay

## 2010-09-12 ENCOUNTER — Ambulatory Visit: Payer: Self-pay | Admitting: Obstetrics & Gynecology

## 2010-10-18 ENCOUNTER — Emergency Department (HOSPITAL_COMMUNITY)
Admission: EM | Admit: 2010-10-18 | Discharge: 2010-10-18 | Payer: Self-pay | Source: Home / Self Care | Admitting: Oncology

## 2010-10-19 ENCOUNTER — Encounter: Payer: Self-pay | Admitting: Internal Medicine

## 2010-10-20 ENCOUNTER — Encounter: Payer: Self-pay | Admitting: Internal Medicine

## 2010-10-21 LAB — DIFFERENTIAL
Basophils Absolute: 0 10*3/uL (ref 0.0–0.1)
Basophils Relative: 1 % (ref 0–1)
Eosinophils Absolute: 0.2 10*3/uL (ref 0.0–0.7)
Eosinophils Relative: 3 % (ref 0–5)
Lymphs Abs: 1.4 10*3/uL (ref 0.7–4.0)
Monocytes Relative: 8 % (ref 3–12)
Neutro Abs: 4.8 10*3/uL (ref 1.7–7.7)
Neutrophils Relative %: 69 % (ref 43–77)

## 2010-10-21 LAB — CBC
HCT: 34.9 % — ABNORMAL LOW (ref 36.0–46.0)
Hemoglobin: 11.1 g/dL — ABNORMAL LOW (ref 12.0–15.0)
MCH: 26 pg (ref 26.0–34.0)
MCHC: 31.8 g/dL (ref 30.0–36.0)
MCV: 81.7 fL (ref 78.0–100.0)
Platelets: 289 10*3/uL (ref 150–400)
RBC: 4.27 MIL/uL (ref 3.87–5.11)
RDW: 14.7 % (ref 11.5–15.5)

## 2010-10-21 LAB — POCT I-STAT, CHEM 8
BUN: 8 mg/dL (ref 6–23)
Calcium, Ion: 1.13 mmol/L (ref 1.12–1.32)
Chloride: 104 mEq/L (ref 96–112)
Glucose, Bld: 121 mg/dL — ABNORMAL HIGH (ref 70–99)
HCT: 37 % (ref 36.0–46.0)
Hemoglobin: 12.6 g/dL (ref 12.0–15.0)
Potassium: 3.7 mEq/L (ref 3.5–5.1)
Sodium: 140 mEq/L (ref 135–145)
TCO2: 27 mmol/L (ref 0–100)

## 2010-10-21 LAB — POCT PREGNANCY, URINE: Preg Test, Ur: NEGATIVE

## 2010-10-29 NOTE — Assessment & Plan Note (Signed)
Summary: ACUTE-BP CHECK AND ASTHMA([facility])/CFB   Vital Signs:  Patient profile:   45 year old female Height:      67 inches Weight:      292.6 pounds BMI:     45.99 Temp:     97.6 degrees F oral Pulse rate:   70 / minute BP sitting:   142 / 92  (right arm)  Vitals Entered By: Filomena Jungling NT II (July 08, 2010 11:22 AM) CC: NEED REFILLS, FLU SHOT, AND ?PUEMONIA / KNEE PAIN Is Patient Diabetic? No Pain Assessment Patient in pain? yes     Location: knee Intensity: 5 Type: aching Nutritional Status BMI of > 30 = obese  Have you ever been in a relationship where you felt threatened, hurt or afraid?No   Does patient need assistance? Functional Status Self care Ambulation Normal   Primary Care Provider:  Johnette Abraham DO  CC:  NEED REFILLS, FLU SHOT, and AND ?PUEMONIA / KNEE PAIN.  History of Present Illness: 45 y/o woman with HTN, asthma comes to the clinic complaining of knee pain  site- right knee since last 6 days.  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  some mild swellling compared to the other knee.  no fever, chills, pain in other joints, h/o gout, injuries to the affectin knee recently but had a fall yrs ago and hurt that knee  Preventive Screening-Counseling & Management  Alcohol-Tobacco     Alcohol drinks/day: 0     Smoking Status: never  Caffeine-Diet-Exercise     Does Patient Exercise: yes     Type of exercise: WALKING     Times/week:   2  Current Medications (verified): 1)  Proventil Hfa 108 (90 Base) Mcg/act  Aers (Albuterol Sulfate) .Marland Kitchen.. 1-2 Puffs Every 6 Hrs As Needed 2)  Hydrochlorothiazide 25 Mg Tab (Hydrochlorothiazide) .... Take 1 Tablet By Mouth Once A Day 3)  Advair Diskus 500-50 Mcg/dose Misc  (Fluticasone-Salmeterol) .... Take One Puff Twice Daily 4)  Singulair 10 Mg Tabs (Montelukast Sodium) .... Take One Tab Daily 5)  Claritin 10 Mg Tabs (Loratadine) .... Take One Tab Daily  Allergies (verified): 1)  ! * Bena Dryl 2)  ! * Peanuts  Review of Systems  The patient denies anorexia, fever, weight loss, weight gain, vision loss, decreased hearing, hoarseness, chest pain, syncope, dyspnea on exertion, peripheral edema, prolonged cough, headaches, hemoptysis, abdominal pain, melena, hematochezia, severe indigestion/heartburn, hematuria, incontinence, genital sores, muscle weakness, suspicious skin lesions, transient blindness, difficulty walking, depression, unusual weight change, abnormal bleeding, enlarged lymph nodes, angioedema, breast masses, and testicular masses.    Physical Exam  General:  alert, well-developed, and well-nourished.   Head:  normocephalic and atraumatic.   Eyes:  vision grossly intact, pupils equal, pupils round, and pupils reactive to light.   Ears:  R ear normal and L ear normal.   Nose:  no external deformity, nose piercing noted, and no external erythema.   Mouth:  good dentition and no gingival abnormalities.   Neck:  supple and full ROM.   Chest Wall:  no deformities and no tenderness.   Lungs:  normal respiratory effort, no intercostal retractions, no accessory muscle use, and normal breath sounds.   Heart:  normal rate, regular rhythm, and no murmur.  Abdomen:  soft, non-tender, normal bowel sounds, and no distention.   Msk:  knee joint exam  inspection- swollen compared to the other knee. no redness, bulge noted palpation- pain on the infrapatellar aspect b/l on the affected knee on palpation full ROB with some discomfort. gait norma no creprutus or dislocation  Pulses:  dorsalis pedis pulses normal bilaterally  Neurologic:  OrientedX3, cranial nerver 2-12 intact,strength good in all extremities, sensations normal to light touch, reflexes 2+  b/l, gait normal    Impression & Recommendations:  Problem # 1:  KNEE PAIN, RIGHT (ICD-719.46) - anetrio knee pain worse with standing and rleived with rest - infrapatellar tendernness on palpation - dd- pre-patellar bursits or quadriceps tendinits or patello-femoral pain syndrome  plan -NSAIDS  - cold compression -gradual weaight bearing - good support shoes ( avoid heels, tight shoes)  - RTC in 1 week if no improvement. -consider X-ray, [facility] referral, PT at that time  Her updated medication list for this problem includes:    Ibuprofen 400 Mg Tabs (Ibuprofen) .Marland Kitchen... Three times a day for 5 days with food  Problem # 2:  ANEMIA, MILD (ICD-285.9) patient wants to take iron tablets as she got her blood work done else where and was told she has iron def will repeat Cbc for elevated WBC at last check  Her updated medication list for this problem includes:    Ferrous Sulfate 300 (60 Fe) Mg/45ml Syrp (Ferrous sulfate) .Marland Kitchen... Take 1 tablet by mouth two times a day  Orders: T-CBC No Diff ([id number])  Problem # 3:  ESSENTIAL HYPERTENSION, BENIGN (ICD-401.1) high today, patient not taking blood preassure meds for 2 months now. refill and counsel about complaince.   Her updated medication list for this problem includes:    Hydrochlorothiazide 25 Mg Tab (Hydrochlorothiazide) .Marland Kitchen... Take 1 tablet by mouth once a day  BP today: 142/92 Prior BP: 128/87 (01/28/2010)  Labs Reviewed: K+: 4.3 (01/28/2010) Creat: : 0.78 (01/28/2010)   Chol: 160 (01/28/2010)   HDL: 53 (01/28/2010)   LDL: 94 (01/28/2010)   TG: 63 (01/28/2010)  Complete Medication List: 1)  Proventil Hfa 108 (90 Base) Mcg/act Aers (Albuterol sulfate) .Marland Kitchen.. 1-2 puffs every 6 hrs as needed 2)  Hydrochlorothiazide 25 Mg Tab (Hydrochlorothiazide) .... Take 1 tablet by mouth once a day 3)  Advair Diskus 500-50 Mcg/dose Misc (Fluticasone-salmeterol) .... Take one puff twice daily 4)  Singulair 10 Mg Tabs (Montelukast sodium) .... Take  one tab daily 5)  Claritin 10 Mg Tabs (Loratadine) .... Take one tab daily 6)  Ibuprofen 400 Mg Tabs (Ibuprofen) .... Three times a day for 5 days with food 7)  Ferrous Sulfate 300 (60 Fe) Mg/44ml Syrp (Ferrous sulfate) .... Take 1 tablet by mouth two times a day  Other Orders: Admin 1st Vaccine (86578) Flu Vaccine 9yrs + (46962)  Patient Instructions: 1)  Please schedule a follow-up appointment in 2 months. 2)  You may move around but avoid painful motions. Apply ice to sore area for 20 minutes 3-4 times a day for 2-3 days. Prescriptions: FERROUS SULFATE 300 (60 FE) MG/5ML SYRP (FERROUS SULFATE) Take 1 tablet by mouth two times a day  #60 x 11   Entered and Authorized by:   Bethel Born MD   Signed by:   Bethel Born MD on 07/08/2010   Method used:   [facility]        [facility]  [address] [phone]* (retail)       [address] [place] [address]  [place], [place]  [address]       Ph: [phone] or [phone]       Fax: [phone]   RxID:   [id number] SINGULAIR 10 MG TABS (MONTELUKAST SODIUM) Take one tab daily  #30 x 11   Entered and Authorized by:   Bethel Born MD   Signed by:   Bethel Born MD on 07/08/2010   Method used:   [facility]        [facility] [address] [phone]* (retail)       [address]       [place], [place]  [address]       Ph: [phone] or [phone]       Fax: [phone]   RxID:   [id number] ADVAIR DISKUS 500-50 MCG/DOSE MISC (FLUTICASONE-SALMETEROL) Take one puff twice daily  #1 month x 11   Entered and Authorized by:   Bethel Born MD   Signed by:   Bethel Born MD on 07/08/2010   Method used:   [facility]        [facility] [phone]* (retail)       [address]       [place], [place]  [address]       Ph: [phone] or [phone]       Fax: [phone]   RxID:   [id number] HYDROCHLOROTHIAZIDE 25 MG TAB  (HYDROCHLOROTHIAZIDE) Take 1 tablet by mouth once a day  #31 x 11   Entered and Authorized by:   Bethel Born MD   Signed by:   Bethel Born MD on 07/08/2010   Method used:   [facility]        [facility] [phone]* (retail)       [address]       [place], [place]  [address]       Ph: [phone] or [phone]       Fax: [phone]   RxID:   [id number] PROVENTIL HFA 108 (90 BASE) MCG/ACT  AERS (ALBUTEROL SULFATE) 1-2 puffs every 6 hrs as needed  #1 month supp x 11   Entered and Authorized by:   Bethel Born MD   Signed by:   Bethel Born MD on 07/08/2010   Method used:   [facility]        [facility] [phone]* (retail)       [address]       [place], [place]  [address]       Ph: [phone] or [phone]       Fax: [phone]   RxID:   [id number] IBUPROFEN 400 MG TABS (IBUPROFEN) three times a day for 5 days with food  #30 x prn   Entered and Authorized by:   Bethel Born MD   Signed by:   Bethel Born MD on 07/08/2010   Method used:   [facility]        [facility] [address] [phone]* (retail)       [address]       [place], [place]  [address]       Ph: [phone] or [phone]       Fax: [phone]  RxID:   [id number]   Prevention & Chronic Care Immunizations   Influenza vaccine: Fluvax 3+  (07/08/2010)    Tetanus booster: Not documented   Td booster deferral: Not indicated  (01/28/2010)    Pneumococcal vaccine: Not documented  Other Screening   Pap smear: Not documented   Pap smear action/deferral: Deferred-2 yr interval  (01/28/2010)    Mammogram: ASSESSMENT: Negative - BI-RADS 1^MM DIGITAL SCREENING  (06/24/2010)   Mammogram action/deferral: Ordered  (06/11/2010)   Smoking status: never  (07/08/2010)  Lipids   Total Cholesterol: 160  (01/28/2010)   Lipid panel action/deferral: Lipid Panel ordered    LDL: 94  (01/28/2010)   LDL Direct: Not documented   HDL: 53  (01/28/2010)   Triglycerides: 63  (01/28/2010)  Hypertension   Last Blood Pressure: 142 / 92  (07/08/2010)   Serum creatinine: 0.78  (01/28/2010)   Serum potassium 4.3  (01/28/2010)    Hypertension flowsheet reviewed?: Yes   Progress toward BP goal: Deteriorated  Self-Management Support :   Personal Goals (by the next clinic visit) :      Personal blood pressure goal: 140/90  (01/28/2010)   Patient will work on the following items until the next clinic visit to reach self-care goals:     Medications and monitoring: take my medicines every day  (07/08/2010)     Eating: drink diet soda or water instead of juice or soda, eat more vegetables, use fresh or frozen vegetables, eat foods that are low in salt, eat baked foods instead of fried foods, eat fruit for snacks and desserts  (07/08/2010)    Hypertension self-management support: Education handout, Written self-care plan  (07/08/2010)   Hypertension self-care plan printed.   Hypertension education handout printed   Nursing Instructions: Give Flu vaccine today  Flu Vaccine Consent Questions     Do you have a history of severe allergic reactions to this vaccine? no    Any prior history of allergic reactions to egg and/or gelatin? no    Do you have a sensitivity to the preservative Thimersol? no    Do you have a past history of Guillan-Barre Syndrome? no    Do you currently have an acute febrile illness? no    Have you ever had a severe reaction to latex? no    Vaccine information given and explained to patient? yes    Are you currently pregnant? no    Lot Number:[id number]   Exp Date:03/29/2011   Manufacturer: [employer]    Site Given  Left Deltoid IM.[facility])  July 08, 2010 12:01 PM  Process Orders Check Orders Results:     [facility]: ABN not required for this insurance Tests Sent for requisitioning (July 08, 2010 1:56 PM):      07/08/2010: [facility] -- T-CBC No Diff [[id number]] (signed)    .opcflu

## 2010-10-29 NOTE — Progress Notes (Signed)
Summary: phone/gg  Phone Note Call from Patient   Caller: Patient Summary of Call: Pt request an order for mammorgram to be done  at [facility] hospital.   They would not do this for her with order from PCP Last visit 01/28/10 Pt # [phone] Initial call taken by: Merrie Roof RN,  June 11, 2010 11:23 AM  Follow-up for Phone Call        Screening mammogram ordered. Follow-up by: Margarito Liner MD,  June 11, 2010 12:37 PM  Additional Follow-up for Phone Call Additional follow up Details #1::        Lela will schedule Mammogram Additional Follow-up by: Merrie Roof RN,  June 11, 2010 1:44 PM

## 2010-10-29 NOTE — Progress Notes (Signed)
Summary: refill/gg  Phone Note Refill Request  on December 19, 2009 11:18 AM  Refills Requested: Medication #1:  TRIAMCINOLONE ACETONIDE 0.1 % OINT Apply to the rash on your knuckles twice a day for 10 days..   Last Refilled: 04/30/2009  Medication #2:  Garlon Hatchet DISKUS 500-50 MCG/DOSE MISC Take one puff twice daily   Last Refilled: 01/21/2009  Medication #3:  PROVENTIL HFA 108 (90 BASE) MCG/ACT  AERS 1-2 puffs every 6 hrs as needed   Last Refilled: 01/21/2009  Method Requested: Electronic Initial call taken by: Merrie Roof RN,  December 19, 2009 11:19 AM    Prescriptions: TRIAMCINOLONE ACETONIDE 0.1 % OINT (TRIAMCINOLONE ACETONIDE) Apply to the rash on your knuckles twice a day for 10 days.  #1 tube x 0   Entered and Authorized by:   Joaquin Courts  MD   Signed by:   Joaquin Courts  MD on 12/19/2009   Method used:   [facility]        [facility] [address] [phone]* (retail)       [address]       [place], [place]  [address]       Ph: [phone] or [phone]       Fax: [phone]   RxID:   [id number] PROVENTIL HFA 108 (90 BASE) MCG/ACT  AERS (ALBUTEROL SULFATE) 1-2 puffs every 6 hrs as needed  #1 month supp x 6   Entered and Authorized by:   Joaquin Courts  MD   Signed by:   Joaquin Courts  MD on 12/19/2009   Method used:   [facility]ronically to        [facility] [address] [phone]* (retail)       [address]       [place], [place]  [address]       Ph: [phone] or [phone]       Fax: [phone]   RxID:   [id number] ADVAIR DISKUS 500-50 MCG/DOSE MISC (FLUTICASONE-SALMETEROL) Take one puff twice daily  #1 month x 6   Entered and Authorized by:   Joaquin Courts  MD   Signed by:   Joaquin Courts  MD on 12/19/2009   Method used:   [facility]        [facility] [phone]* (retail)       [address]       [place], [place]  [address]       Ph:  [phone] or [phone]       Fax: [phone]   RxID:   [id number]
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
## 2011-01-29 ENCOUNTER — Telehealth: Payer: Self-pay | Admitting: *Deleted

## 2011-01-29 ENCOUNTER — Ambulatory Visit (INDEPENDENT_AMBULATORY_CARE_PROVIDER_SITE_OTHER): Payer: Self-pay | Admitting: Ophthalmology

## 2011-01-29 ENCOUNTER — Encounter: Payer: Self-pay | Admitting: Ophthalmology

## 2011-01-29 DIAGNOSIS — R05 Cough: Secondary | ICD-10-CM | POA: Insufficient documentation

## 2011-01-29 DIAGNOSIS — J45909 Unspecified asthma, uncomplicated: Secondary | ICD-10-CM

## 2011-01-29 DIAGNOSIS — I10 Essential (primary) hypertension: Secondary | ICD-10-CM

## 2011-01-29 MED ORDER — BENZONATATE 100 MG PO CAPS: 200.0000 mg | ORAL_CAPSULE | Freq: Three times a day (TID) | ORAL | Status: DC | PRN

## 2011-01-29 MED ORDER — ALBUTEROL SULFATE HFA 108 (90 BASE) MCG/ACT IN AERS: 2.0000 | INHALATION_SPRAY | Freq: Four times a day (QID) | RESPIRATORY_TRACT | Status: DC | PRN

## 2011-01-29 MED ORDER — FERROUS SULFATE 300 (60 FE) MG/5ML PO SYRP: 300.0000 mg | ORAL_SOLUTION | Freq: Two times a day (BID) | ORAL | Status: DC

## 2011-01-29 MED ORDER — HYDROCHLOROTHIAZIDE 25 MG PO TABS: 25.0000 mg | ORAL_TABLET | Freq: Every day | ORAL | Status: DC

## 2011-01-29 MED ORDER — LORATADINE 10 MG PO TABS: 10.0000 mg | ORAL_TABLET | Freq: Every day | ORAL | Status: DC

## 2011-01-29 MED ORDER — FLUTICASONE-SALMETEROL 500-50 MCG/DOSE IN AEPB: 1.0000 | INHALATION_SPRAY | Freq: Two times a day (BID) | RESPIRATORY_TRACT | Status: DC

## 2011-01-29 MED ORDER — MONTELUKAST SODIUM 10 MG PO TABS: 10.0000 mg | ORAL_TABLET | Freq: Every day | ORAL | Status: DC

## 2011-01-29 NOTE — Assessment & Plan Note (Signed)
This has been ongoing for 4 days, and there are no significantly concerning signs or symptoms at this time. I suspect that this is likely of viral etiology, and I do not feel that an antibiotic is warranted at this time. I will treat the patient symptomatically with benzonatate as she is paying out of pocket, and will be unlikely to be able to afford other medications such as Tussionex. The patient's lungs were bilaterally clear to auscultation with no significant wheezing. I informed the patient that she should contact the clinic if she does not see improvement over the next several days.

## 2011-01-29 NOTE — Telephone Encounter (Signed)
Call from pt stating that she has had a cough for 4 days.  Cough is non-productive and leaves her short of breath after coughing.  Has no fevers or chills.  Has tried OTC cough medication that she said made the cough worse.  Pt was given an appointment for 2:15 this afternoon.

## 2011-01-29 NOTE — Patient Instructions (Addendum)
Please return within the next 3 months to followup with her primary care doctor. I am prescribing a new medication for your cough. Please call if you do not have any improvement.  Please see Jaynee Eagles for your orange card application.

## 2011-01-29 NOTE — Progress Notes (Signed)
Subjective:    Patient ID: Lynn Bond, female    DOB: 15-Feb-1966, 45 y.o.   MRN: [id number]  HPI  This is a 45 year old lady with a past medical history significant for GERD, hypertension, and obesity, who presents for an acute visit for cough. The patient states that she's had a cough for the last 4 days, this is not associated with significant shortness of breath, it was originally nonproductive, but over last day she started producing scant amounts of white phlegm. The patient denies fevers chills or other concerning systemic symptoms. The patient also relates that she has not been taking any of her medications for several months because she was unable to afford them when her Medicaid ran out. The patient requests refills for all of these medications today. The patient has no other concerns.  Review of Systems  Constitutional: Negative for fever and chills.  Respiratory: Positive for cough. Negative for shortness of breath.   Cardiovascular: Negative for chest pain and palpitations.  Gastrointestinal: Negative for vomiting, diarrhea and constipation.       Objective:   Physical Exam  Constitutional: She appears well-developed and well-nourished.  HENT:  Head: Normocephalic and atraumatic.  Eyes: Pupils are equal, round, and reactive to light.  Cardiovascular: Normal rate, regular rhythm and intact distal pulses.  Exam reveals no gallop and no friction rub.   No murmur heard. Pulmonary/Chest: Effort normal and breath sounds normal. She has no wheezes. She has no rales.  Abdominal: Soft. Bowel sounds are normal. She exhibits no distension. There is no tenderness.  Musculoskeletal: Normal range of motion.  Neurological: She is alert. No cranial nerve deficit.  Skin: No rash noted.       Current Outpatient Prescriptions  Medication Sig Dispense Refill  . albuterol (VENTOLIN HFA) 108 (90 BASE) MCG/ACT inhaler Inhale 2 puffs into the lungs every 6 (six) hours as needed.  1  Inhaler  6  . benzonatate (TESSALON) 100 MG capsule Take 2 capsules (200 mg total) by mouth 3 (three) times daily as needed.  42 capsule  1  . ferrous sulfate 300 (60 FE) MG/5ML syrup Take 5 mLs (300 mg total) by mouth 2 (two) times daily.  150 mL  6  . Fluticasone-Salmeterol (ADVAIR DISKUS) 500-50 MCG/DOSE AEPB Inhale 1 puff into the lungs every 12 (twelve) hours.  60 each    . hydrochlorothiazide 25 MG tablet Take 1 tablet (25 mg total) by mouth daily.  30 tablet  11  . loratadine (CLARITIN) 10 MG tablet Take 1 tablet (10 mg total) by mouth daily.  30 tablet  5  . montelukast (SINGULAIR) 10 MG tablet Take 1 tablet (10 mg total) by mouth at bedtime.  30 tablet  6  . DISCONTD: albuterol (VENTOLIN HFA) 108 (90 BASE) MCG/ACT inhaler Inhale 2 puffs into the lungs every 6 (six) hours as needed.        Marland Kitchen DISCONTD: benzonatate (TESSALON) 100 MG capsule Take 100 mg by mouth 3 (three) times daily as needed.        Marland Kitchen DISCONTD: ferrous sulfate 300 (60 FE) MG/5ML syrup Take 300 mg by mouth 2 (two) times daily.        Marland Kitchen DISCONTD: Fluticasone-Salmeterol (ADVAIR DISKUS) 500-50 MCG/DOSE AEPB Inhale 1 puff into the lungs every 12 (twelve) hours.        Marland Kitchen DISCONTD: hydrochlorothiazide 25 MG tablet Take 25 mg by mouth daily.        Marland Kitchen DISCONTD: loratadine (CLARITIN) 10 MG tablet Take  10 mg by mouth daily.        Marland Kitchen DISCONTD: montelukast (SINGULAIR) 10 MG tablet Take 10 mg by mouth at bedtime.          Patient Active Problem List  Diagnoses  . OBESITY, UNSPECIFIED  . ANEMIA, MILD  . TARSAL TUNNEL SYNDROME, RIGHT  . ESSENTIAL HYPERTENSION, BENIGN  . ALLERGIC RHINITIS  . EXTRINSIC ASTHMA, UNSPECIFIED  . GERD  . ECZEMA  . KNEE PAIN, RIGHT  . PES PLANUS, CONGENITAL  . ANKLE SPRAIN, RIGHT     Assessment & Plan:

## 2011-01-29 NOTE — Assessment & Plan Note (Signed)
The patient is currently on hydrochlorothiazide 25 mg, though she has not been taking this. The patient does relate that her blood pressure does remain elevated at times during the day up to 160 systolic. As such I will refill the patient's HCTZ at this time.

## 2011-01-29 NOTE — Assessment & Plan Note (Signed)
The patient is currently on Advair 500, but has been unable to afford this. The patient complains of occasional wheezing but no significant shortness of breath. I did give the patient a sample of Advair 250/50 today, with a plan for her to followup with Rudell Cobb to possibly apply for the [facility] card if she qualifies. As this would assist her in affording her medications. We'll continue to monitor this for now, and refill the patient's Advair when she is able to afford it.

## 2011-01-29 NOTE — Telephone Encounter (Signed)
Agree with appt. 

## 2011-02-11 NOTE — Procedures (Signed)
NAME:  Lynn Bond, Lynn Bond NO.:  [id number]   MEDICAL RECORD NO.:  [id number]          PATIENT TYPE:  OUT   LOCATION:  SLEEP CENTER                 FACILITY:  [facility]   PHYSICIAN:  Clinton D. Maple Hudson, MD, FCCP, FACPDATE OF BIRTH:  12-18-66   DATE OF STUDY:  07/13/2008                            NOCTURNAL POLYSOMNOGRAM   REFERRING PHYSICIAN:  Mariea Stable, MD   INDICATION FOR STUDY:  Hypersomnia with sleep apnea.   EPWORTH SLEEPINESS SCORE:  Epworth sleepiness score 12/24.  BMI 46.2.  Weight 295 pounds.  Height 67 inches.  Neck 15.5 inches.   MEDICATIONS:  Home medications are charted and reviewed.   SLEEP ARCHITECTURE:  Total sleep time 357 minutes with sleep efficiency  77.9%.  Stage I was 5.2%.  Stage II 75.5%.  Stage III absent.  REM 19.3%  of total sleep time.  Sleep latency is 66 minutes.  REM latency 62  minutes.  Awake after sleep onset 35 minutes.  Arousal index 10.3.  No  bedtime medication was taken.   RESPIRATORY DATA:  Apnea-hypopnea index (AHI) 2 per hour.  A total of 12  events were scored, all hypopneas.  Events were more common while  supine.  REM AHI 7.8 per hour.  There were insufficient events to permit  CPAP titration by split protocol on the study night.   OXYGEN DATA:  Moderately loud snoring with oxygen desaturation to a  nadir of 89%.  Mean oxygen saturation through the study was 97.6% on  room air.   CARDIAC DATA:  Normal sinus rhythm.   MOVEMENT-PARASOMNIA:  A few limb jerks were recorded, but none  associated with sleep disturbance or arousal.  Bathroom x1.   IMPRESSIONS-RECOMMENDATIONS:  1. Unremarkable sleep architecture for sleep stature environment.  2. Occasional respiratory event causing sleep disturbance, AHI 2 per      hour (normal range 0-5 per hour).  Events      were hypopneas, mostly associated with supine sleep position in      REM.  Moderately loud snoring without oxygen desaturation to a      nadir of 89%.  A  total of 0 minutes was recorded with saturation      less than 88%.      Clinton D. Maple Hudson, MD, [facility], FACP  Diplomate, Biomedical engineer of Sleep Medicine  Electronically Signed     CDY/MEDQ  D:  07/15/2008 18:52:03  T:  07/15/2008 23:12:51  Job:  [id number]

## 2011-02-14 NOTE — Group Therapy Note (Signed)
   NAME:  Lynn Bond, Lynn Bond NO.:  [id number]   MEDICAL RECORD NO.:  [id number]                   PATIENT TYPE:  OUT   LOCATION:  [facility]                           FACILITY:  [facility]   PHYSICIAN:  Ellis Parents, MD                 DATE OF BIRTH:  August 29, 1966   DATE OF SERVICE:  08/04/2003                                    CLINIC NOTE   SUMMARY:  This 45 year old multiparous female is referred from internal  medicine clinic because cannot find cervix.  The patient has no  gynecologic complaints, she has no past history of dysplasia.  Speculum  examination using the large speculum visualized the cervix without  difficulty.  The cervix was hypertrophic with slight ectopy being present.  Otherwise, there were no gross abnormalities.  Cytology was obtained.  Bimanual exam was not performed because it would be unreliable due to the  weight factor of 293 pounds.  The patient was advised she will receive a  letter advising her of the results of the Pap smear.                                               Ellis Parents, MD    SA/MEDQ  D:  08/04/2003  T:  08/04/2003  Job:  [id number]

## 2011-07-08 LAB — URINALYSIS, ROUTINE W REFLEX MICROSCOPIC
Bilirubin Urine: NEGATIVE
Glucose, UA: NEGATIVE
Ketones, ur: NEGATIVE
Leukocytes, UA: NEGATIVE
Nitrite: NEGATIVE
Protein, ur: NEGATIVE
Specific Gravity, Urine: 1.011
Urobilinogen, UA: 0.2
pH: 7

## 2011-07-08 LAB — DIFFERENTIAL
Basophils Relative: 0
Eosinophils Absolute: 0
Lymphs Abs: 0.9
Monocytes Absolute: 0.2
Monocytes Relative: 2 — ABNORMAL LOW
Neutro Abs: 7.5

## 2011-07-08 LAB — CBC
HCT: 35.4 — ABNORMAL LOW
Hemoglobin: 11.7 — ABNORMAL LOW
MCHC: 33
MCV: 81.4
Platelets: 316
RBC: 4.35
RDW: 16.5 — ABNORMAL HIGH
WBC: 8.6

## 2011-07-08 LAB — COMPREHENSIVE METABOLIC PANEL
BUN: 12
CO2: 24
Chloride: 105
Creatinine, Ser: 0.72
GFR calc non Af Amer: >60
Total Bilirubin: 0.5

## 2011-07-08 LAB — POCT PREGNANCY, URINE
Operator id: 257131
Preg Test, Ur: NEGATIVE

## 2011-07-08 LAB — URINE MICROSCOPIC-ADD ON

## 2011-08-28 ENCOUNTER — Encounter: Payer: Self-pay | Admitting: Internal Medicine

## 2011-10-16 ENCOUNTER — Emergency Department (HOSPITAL_COMMUNITY)
Admission: EM | Admit: 2011-10-16 | Discharge: 2011-10-16 | Disposition: A | Discharge status: Home / Self Care | Payer: Self-pay | Source: Home / Self Care | Attending: Family Medicine | Admitting: Family Medicine

## 2011-10-16 ENCOUNTER — Encounter (HOSPITAL_COMMUNITY): Payer: Self-pay | Admitting: *Deleted

## 2011-10-16 DIAGNOSIS — J209 Acute bronchitis, unspecified: Secondary | ICD-10-CM

## 2011-10-16 DIAGNOSIS — J45909 Unspecified asthma, uncomplicated: Secondary | ICD-10-CM

## 2011-10-16 HISTORY — DX: Essential (primary) hypertension: I10

## 2011-10-16 MED ORDER — IPRATROPIUM BROMIDE 0.02 % IN SOLN: 0.5000 mg | Freq: Once | RESPIRATORY_TRACT | Status: DC

## 2011-10-16 MED ORDER — GUAIFENESIN-CODEINE 100-10 MG/5ML PO SYRP: 10.0000 mL | ORAL_SOLUTION | Freq: Three times a day (TID) | ORAL | Status: DC | PRN

## 2011-10-16 MED ORDER — ALBUTEROL SULFATE (5 MG/ML) 0.5% IN NEBU
INHALATION_SOLUTION | RESPIRATORY_TRACT | Status: AC
  Filled 2011-10-16: qty 1

## 2011-10-16 MED ORDER — AMOXICILLIN-POT CLAVULANATE 875-125 MG PO TABS: 1.0000 | ORAL_TABLET | Freq: Two times a day (BID) | ORAL | Status: AC

## 2011-10-16 MED ORDER — ALBUTEROL SULFATE (5 MG/ML) 0.5% IN NEBU
5.0000 mg | INHALATION_SOLUTION | Freq: Once | RESPIRATORY_TRACT | Status: AC
  Administered 2011-10-16: 5 mg via RESPIRATORY_TRACT

## 2011-10-16 MED ORDER — IPRATROPIUM BROMIDE 0.06 % NA SOLN: 2.0000 | Freq: Four times a day (QID) | NASAL | Status: DC
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
## 2011-10-16 NOTE — ED Notes (Signed)
C/o hard to breath especially when walking. States coughing up brown and yellow phlegm for 3 days

## 2011-10-21 ENCOUNTER — Encounter: Payer: Self-pay | Admitting: Internal Medicine

## 2011-10-21 ENCOUNTER — Ambulatory Visit (INDEPENDENT_AMBULATORY_CARE_PROVIDER_SITE_OTHER): Payer: Self-pay | Admitting: Internal Medicine

## 2011-10-21 DIAGNOSIS — D649 Anemia, unspecified: Secondary | ICD-10-CM

## 2011-10-21 DIAGNOSIS — R05 Cough: Secondary | ICD-10-CM

## 2011-10-21 DIAGNOSIS — Z23 Encounter for immunization: Secondary | ICD-10-CM

## 2011-10-21 DIAGNOSIS — I10 Essential (primary) hypertension: Secondary | ICD-10-CM

## 2011-10-21 DIAGNOSIS — R059 Cough, unspecified: Secondary | ICD-10-CM

## 2011-10-21 MED ORDER — IPRATROPIUM BROMIDE 0.06 % NA SOLN: 2.0000 | Freq: Four times a day (QID) | NASAL | Status: DC

## 2011-10-21 MED ORDER — HYDROCHLOROTHIAZIDE 25 MG PO TABS: 25.0000 mg | ORAL_TABLET | Freq: Every day | ORAL | Status: DC

## 2011-10-21 MED ORDER — LORATADINE 10 MG PO TABS: 10.0000 mg | ORAL_TABLET | Freq: Every day | ORAL | Status: DC

## 2011-10-21 MED ORDER — ALBUTEROL SULFATE HFA 108 (90 BASE) MCG/ACT IN AERS: 2.0000 | INHALATION_SPRAY | Freq: Four times a day (QID) | RESPIRATORY_TRACT | Status: DC | PRN

## 2011-10-21 MED ORDER — FERROUS SULFATE 300 (60 FE) MG/5ML PO SYRP: 300.0000 mg | ORAL_SOLUTION | Freq: Two times a day (BID) | ORAL | Status: DC

## 2011-10-21 MED ORDER — FLUTICASONE-SALMETEROL 250-50 MCG/DOSE IN AEPB: 1.0000 | INHALATION_SPRAY | Freq: Two times a day (BID) | RESPIRATORY_TRACT | Status: DC

## 2011-10-21 NOTE — Patient Instructions (Addendum)
Please use your albuterol inhaler three times per day for 5 days, thereafter use it only as needed for emergency shortness of breath  Call us if your cough is not resolved in 2 weeks.    Please return to clinic in 3 months for a blood pressure and asthma check.

## 2011-10-23 NOTE — Progress Notes (Signed)
Subjective:   Patient ID: Lynn Bond female   DOB: October 11, 1965 46 y.o.   MRN: [id number]  HPI: Lynn Bond is a 46 y.o. with asthma, HTN, allergic rhinitis who presents with productive cough for 6 days.  Productive of brown and yellow sputum.  Went to urgent care 6 days ago for this.  Has some chest pain when she coughs, non-pleuritic.  No fevers or chills.  No nausea or vomiting.  SOB is much improved from when she went to urgent care.  Urgent care prescribed 10 days of Augmentin, Atrovent nasal spray, and codeine cough syrup.  She says the atrovent helps clear her nasal congestion but the codeine cough syrup doesn't help the cough.    She uses advair and albuterol for her asthma.  She is also prescribed singulair but she cannot afford it so has been without it.   Wants flu shot today.    Past Medical History  Diagnosis Date  . Asthma   . Hypertension    Current Outpatient Prescriptions  Medication Sig Dispense Refill  . albuterol (VENTOLIN HFA) 108 (90 BASE) MCG/ACT inhaler Inhale 2 puffs into the lungs every 6 (six) hours as needed.  1 Inhaler  6  . amoxicillin-clavulanate (AUGMENTIN) 875-125 MG per tablet Take 1 tablet by mouth 2 (two) times daily.  20 tablet  0  . ferrous sulfate 300 (60 FE) MG/5ML syrup Take 5 mLs (300 mg total) by mouth 2 (two) times daily.  150 mL  6  . Fluticasone-Salmeterol (ADVAIR DISKUS) 250-50 MCG/DOSE AEPB Inhale 1 puff into the lungs 2 (two) times daily.  60 each  4  . hydrochlorothiazide (HYDRODIURIL) 25 MG tablet Take 1 tablet (25 mg total) by mouth daily.  30 tablet  6  . ipratropium (ATROVENT) 0.06 % nasal spray Place 2 sprays into the nose 4 (four) times daily.  15 mL  1  . loratadine (CLARITIN) 10 MG tablet Take 1 tablet (10 mg total) by mouth daily.  30 tablet  5   No family history on file. History   Social History  . Marital Status: Married    Spouse Name: N/A    Number of Children: N/A  . Years of Education: N/A   Social  History Main Topics  . Smoking status: Former Smoker    Types: Cigarettes    Quit date: 10/20/1988  . Smokeless tobacco: Not on file   Comment: quit 26yrs ago  . Alcohol Use: No  . Drug Use: No  . Sexually Active: Yes   Other Topics Concern  . Not on file   Social History Narrative  . No narrative on file   Review of Systems: Constitutional: Denies fever, chills, diaphoresis, appetite change and fatigue.  Respiratory: Denies chest tightness Cardiovascular: Denies palpitations and leg swelling.  Gastrointestinal: Denies nausea, vomiting, abdominal pain, diarrhea, constipation, blood in stool and abdominal distention.  Genitourinary: Denies dysuria, urgency, frequency, hematuria, flank pain and difficulty urinating.  Skin: Denies pallor, rash and wound.  Neurological: Denies dizziness, seizures, syncope, weakness, light-headedness, numbness and headaches.   Objective:  Physical Exam: Filed Vitals:   10/21/11 1610  BP: 132/93  Pulse: 69  Temp: 97 F (36.1 C)  TempSrc: Oral  Height: 5\' 7"  (1.702 m)  Weight: 294 lb 12.8 oz (133.72 kg)   Constitutional: Vital signs reviewed.  Patient is a well-developed and well-nourished woman in no acute distress and cooperative with exam. Alert and oriented x3.  Head: Normocephalic and atraumatic Mouth: no erythema  or exudates, MMM Eyes: PERRL, EOMI, conjunctivae normal, No scleral icterus.  Neck: No JVD Cardiovascular: RRR, S1 normal, S2 normal, no MRG, pulses symmetric and intact bilaterally Pulmonary/Chest: Mild inspiratory wheezing present diffusely.   Neurological: A&O x3, Strength is normal and symmetric bilaterally, cranial nerve II-XII are grossly intact, no focal motor deficit, sensory intact to light touch bilaterally.  Skin: Warm, dry and intact. No rash, cyanosis, or clubbing.    Assessment & Plan:

## 2011-10-23 NOTE — Assessment & Plan Note (Signed)
Likely viral URI.  Only 6 days duration and improving.  Lungs have some inspiratory wheezing but otherwise clear.  No headaches or facial tenderness on exam.  No fevers.  Treat symptomatically for now. Continue previously prescribed course of augmentin For wheezing, use albuterol scheduled TID until cough is better or for max of 5 days, then PRN after that for emergency shortness of breath. Samples of advair and albuterol inhalers given today.   Discontinue singulair due to cost and lack of great efficacy.

## 2012-08-24 ENCOUNTER — Telehealth: Payer: Self-pay | Admitting: Internal Medicine

## 2012-08-24 NOTE — Telephone Encounter (Signed)
Patient was called this afternoon to schedule an office visit for HM.  She is out of town will call back next week when she returns to schedule an appointment.

## 2012-08-24 NOTE — Telephone Encounter (Signed)
Great, thank you. I appreciate the continued follow-up after she returns from vacation. She MUST come back in if she wants to me to continue to refill her medications.  Really appreciate it.  Thank you! - Johnette Abraham, D.O., 08/24/2012, 3:13 PM

## 2012-09-06 ENCOUNTER — Encounter: Payer: Self-pay | Admitting: Internal Medicine

## 2012-10-11 ENCOUNTER — Ambulatory Visit (INDEPENDENT_AMBULATORY_CARE_PROVIDER_SITE_OTHER): Payer: Self-pay | Admitting: Internal Medicine

## 2012-10-11 ENCOUNTER — Encounter: Payer: Self-pay | Admitting: Internal Medicine

## 2012-10-11 VITALS — BP 140/97 | HR 72 | Temp 98.5°F | Ht 67.0 in | Wt 296.8 lb

## 2012-10-11 DIAGNOSIS — Z0000 Encounter for general adult medical examination without abnormal findings: Secondary | ICD-10-CM | POA: Insufficient documentation

## 2012-10-11 DIAGNOSIS — J45909 Unspecified asthma, uncomplicated: Secondary | ICD-10-CM

## 2012-10-11 DIAGNOSIS — I10 Essential (primary) hypertension: Secondary | ICD-10-CM

## 2012-10-11 DIAGNOSIS — J309 Allergic rhinitis, unspecified: Secondary | ICD-10-CM

## 2012-10-11 DIAGNOSIS — Z23 Encounter for immunization: Secondary | ICD-10-CM

## 2012-10-11 DIAGNOSIS — D649 Anemia, unspecified: Secondary | ICD-10-CM

## 2012-10-11 DIAGNOSIS — R05 Cough: Secondary | ICD-10-CM

## 2012-10-11 LAB — CBC
HCT: 34 % — ABNORMAL LOW (ref 36.0–46.0)
Platelets: 303 10*3/uL (ref 150–400)
RBC: 4.25 MIL/uL (ref 3.87–5.11)
RDW: 16.2 % — ABNORMAL HIGH (ref 11.5–15.5)
WBC: 8.2 10*3/uL (ref 4.0–10.5)

## 2012-10-11 LAB — LIPID PANEL
HDL: 49 mg/dL (ref >39)
LDL Cholesterol: 85 mg/dL (ref 0–99)
Total CHOL/HDL Ratio: 2.9 Ratio

## 2012-10-11 MED ORDER — FLUTICASONE-SALMETEROL 250-50 MCG/DOSE IN AEPB: 1.0000 | INHALATION_SPRAY | Freq: Two times a day (BID) | RESPIRATORY_TRACT | Status: DC

## 2012-10-11 MED ORDER — ALBUTEROL SULFATE HFA 108 (90 BASE) MCG/ACT IN AERS: 2.0000 | INHALATION_SPRAY | Freq: Four times a day (QID) | RESPIRATORY_TRACT | Status: DC | PRN

## 2012-10-11 MED ORDER — HYDROCHLOROTHIAZIDE 25 MG PO TABS: 25.0000 mg | ORAL_TABLET | Freq: Every day | ORAL | Status: DC

## 2012-10-11 MED ORDER — FERROUS SULFATE 300 (60 FE) MG/5ML PO SYRP: 300.0000 mg | ORAL_SOLUTION | Freq: Two times a day (BID) | ORAL | Status: DC

## 2012-10-11 NOTE — Patient Instructions (Signed)
Please return to clinic in 3 months for follow up.

## 2012-10-11 NOTE — Assessment & Plan Note (Signed)
Stable, continue loratadine when needed.

## 2012-10-11 NOTE — Assessment & Plan Note (Addendum)
Patient with long standing hypertension, has been out of medications the past 3 weeks after running out of refills. She lived in [place] previously and did not have regular medical care but had several ED visits for hypertensive urgency. She has been back in [place] for 6 months and just ran out of HCTZ refills.  -Refill HCTZ 25 mg daily -Return to clinic in 2-3 months for recheck -Obtain BMP today
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
## 2012-10-11 NOTE — Assessment & Plan Note (Addendum)
Patient has stable asthma without recent exacerbations. She denies shortness of breath, cough, wheezing at this visit. She is supposed to be on Advair but has recently not been able to afford this. She does find herself using her albuterol inhaler when exposed to cold air or working out. 1-2 times per week.  -Refill albuterol and Advair -Advair sample given today

## 2012-10-11 NOTE — Assessment & Plan Note (Signed)
Patient is reestablishing care with our clinic. She did receive a flu shot today. She is due for a mammogram and a Pap smear. We will try to do this at the next visit when she has her orange card set up.  -Fasting lipid panel today

## 2012-10-11 NOTE — Assessment & Plan Note (Signed)
Patient has been on iron replacement therapy for her anemia. I do not see an anemia panel done recently, so we'll repeat that today and reassess need for iron supplementation. Patient states she does get mild fatigue without the iron.

## 2012-10-12 ENCOUNTER — Telehealth: Payer: Self-pay | Admitting: *Deleted

## 2012-10-12 LAB — BASIC METABOLIC PANEL WITH GFR
BUN: 12 mg/dL (ref 6–23)
Creat: 0.91 mg/dL (ref 0.50–1.10)
GFR, Est African American: 88 mL/min
Glucose, Bld: 92 mg/dL (ref 70–99)
Potassium: 4.1 mEq/L (ref 3.5–5.3)

## 2012-10-12 LAB — ANEMIA PANEL
ABS Retic: 54.6 10*3/uL (ref 19.0–186.0)
Ferritin: 22 ng/mL (ref 10–291)
Iron: 30 ug/dL — ABNORMAL LOW (ref 42–145)
RBC.: 4.2 MIL/uL (ref 3.87–5.11)

## 2012-10-12 MED ORDER — FERROUS SULFATE 325 (65 FE) MG PO TBEC: 325.0000 mg | DELAYED_RELEASE_TABLET | Freq: Two times a day (BID) | ORAL | Status: DC

## 2012-10-12 NOTE — Telephone Encounter (Signed)
Received fax from [facility] - Ferrous Sulfate Syrup is not available.  Can this be changed? Thanks

## 2012-10-12 NOTE — Telephone Encounter (Signed)
Oral iron tabs would be fine. Rx sent electronically to pharmacy.

## 2013-01-18 ENCOUNTER — Encounter (HOSPITAL_COMMUNITY): Payer: Self-pay | Admitting: Emergency Medicine

## 2013-01-18 ENCOUNTER — Emergency Department (HOSPITAL_COMMUNITY)
Admission: EM | Admit: 2013-01-18 | Discharge: 2013-01-18 | Disposition: A | Discharge status: Home / Self Care | Payer: Self-pay | Attending: Emergency Medicine | Admitting: Emergency Medicine

## 2013-01-18 DIAGNOSIS — R0789 Other chest pain: Secondary | ICD-10-CM | POA: Insufficient documentation

## 2013-01-18 DIAGNOSIS — J3489 Other specified disorders of nose and nasal sinuses: Secondary | ICD-10-CM | POA: Insufficient documentation

## 2013-01-18 DIAGNOSIS — I10 Essential (primary) hypertension: Secondary | ICD-10-CM | POA: Insufficient documentation

## 2013-01-18 DIAGNOSIS — J45901 Unspecified asthma with (acute) exacerbation: Secondary | ICD-10-CM | POA: Insufficient documentation

## 2013-01-18 DIAGNOSIS — Z87891 Personal history of nicotine dependence: Secondary | ICD-10-CM | POA: Insufficient documentation

## 2013-01-18 DIAGNOSIS — R05 Cough: Secondary | ICD-10-CM | POA: Insufficient documentation

## 2013-01-18 DIAGNOSIS — Z79899 Other long term (current) drug therapy: Secondary | ICD-10-CM | POA: Insufficient documentation

## 2013-01-18 DIAGNOSIS — IMO0002 Reserved for concepts with insufficient information to code with codable children: Secondary | ICD-10-CM | POA: Insufficient documentation

## 2013-01-18 DIAGNOSIS — R059 Cough, unspecified: Secondary | ICD-10-CM | POA: Insufficient documentation

## 2013-01-18 MED ORDER — PREDNISONE 20 MG PO TABS
60.0000 mg | ORAL_TABLET | Freq: Once | ORAL | Status: AC
  Administered 2013-01-18: 60 mg via ORAL
  Filled 2013-01-18: qty 3

## 2013-01-18 MED ORDER — ALBUTEROL SULFATE (5 MG/ML) 0.5% IN NEBU
2.5000 mg | INHALATION_SOLUTION | Freq: Once | RESPIRATORY_TRACT | Status: AC
  Administered 2013-01-18: 2.5 mg via RESPIRATORY_TRACT
  Filled 2013-01-18: qty 0.5

## 2013-01-18 MED ORDER — ALBUTEROL SULFATE HFA 108 (90 BASE) MCG/ACT IN AERS: 2.0000 | INHALATION_SPRAY | RESPIRATORY_TRACT | Status: DC | PRN

## 2013-01-18 MED ORDER — PREDNISONE 10 MG PO TABS: 20.0000 mg | ORAL_TABLET | Freq: Every day | ORAL | Status: DC

## 2013-01-18 NOTE — ED Notes (Signed)
NAD noted at time of d/c home with family 
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
## 2013-01-18 NOTE — ED Notes (Signed)
Pt c/o SOB and wheezing with no relief after using her albuterol inhaler. Pt able to speak complete sentences. Audible wheezing heard. Hx of asthma.

## 2013-01-20 NOTE — ED Provider Notes (Signed)
Medical screening examination/treatment/procedure(s) were performed by non-physician practitioner and as supervising physician I was immediately available for consultation/collaboration.  Sharlett Lienemann T Jonathyn Carothers, MD 01/20/13 1609 

## 2013-01-21 ENCOUNTER — Ambulatory Visit: Payer: Self-pay

## 2013-01-25 ENCOUNTER — Encounter: Payer: Self-pay | Admitting: Internal Medicine

## 2013-01-27 ENCOUNTER — Encounter: Payer: Self-pay | Admitting: Internal Medicine

## 2013-03-12 ENCOUNTER — Encounter (HOSPITAL_COMMUNITY): Payer: Self-pay | Admitting: Emergency Medicine

## 2013-03-12 ENCOUNTER — Emergency Department (HOSPITAL_COMMUNITY)
Admission: EM | Admit: 2013-03-12 | Discharge: 2013-03-12 | Disposition: A | Discharge status: Home / Self Care | Payer: Self-pay | Attending: Emergency Medicine | Admitting: Emergency Medicine

## 2013-03-12 DIAGNOSIS — R05 Cough: Secondary | ICD-10-CM | POA: Insufficient documentation

## 2013-03-12 DIAGNOSIS — R059 Cough, unspecified: Secondary | ICD-10-CM | POA: Insufficient documentation

## 2013-03-12 DIAGNOSIS — Z8719 Personal history of other diseases of the digestive system: Secondary | ICD-10-CM | POA: Insufficient documentation

## 2013-03-12 DIAGNOSIS — I10 Essential (primary) hypertension: Secondary | ICD-10-CM | POA: Insufficient documentation

## 2013-03-12 DIAGNOSIS — Z79899 Other long term (current) drug therapy: Secondary | ICD-10-CM | POA: Insufficient documentation

## 2013-03-12 DIAGNOSIS — Z872 Personal history of diseases of the skin and subcutaneous tissue: Secondary | ICD-10-CM | POA: Insufficient documentation

## 2013-03-12 DIAGNOSIS — J45901 Unspecified asthma with (acute) exacerbation: Secondary | ICD-10-CM | POA: Insufficient documentation

## 2013-03-12 DIAGNOSIS — Z87891 Personal history of nicotine dependence: Secondary | ICD-10-CM | POA: Insufficient documentation

## 2013-03-12 DIAGNOSIS — R0602 Shortness of breath: Secondary | ICD-10-CM | POA: Insufficient documentation

## 2013-03-12 MED ORDER — IPRATROPIUM BROMIDE 0.02 % IN SOLN
0.5000 mg | Freq: Once | RESPIRATORY_TRACT | Status: AC
  Administered 2013-03-12: 0.5 mg via RESPIRATORY_TRACT
  Filled 2013-03-12: qty 2.5

## 2013-03-12 MED ORDER — PREDNISONE 20 MG PO TABS
60.0000 mg | ORAL_TABLET | Freq: Once | ORAL | Status: AC
  Administered 2013-03-12: 60 mg via ORAL
  Filled 2013-03-12: qty 3

## 2013-03-12 MED ORDER — ALBUTEROL (5 MG/ML) CONTINUOUS INHALATION SOLN
10.0000 mg/h | INHALATION_SOLUTION | Freq: Once | RESPIRATORY_TRACT | Status: AC
  Administered 2013-03-12: 10 mg/h via RESPIRATORY_TRACT

## 2013-03-12 MED ORDER — PREDNISONE 50 MG PO TABS: 50.0000 mg | ORAL_TABLET | Freq: Every day | ORAL | Status: DC

## 2013-03-12 MED ORDER — ALBUTEROL SULFATE HFA 108 (90 BASE) MCG/ACT IN AERS
2.0000 | INHALATION_SPRAY | Freq: Once | RESPIRATORY_TRACT | Status: AC
  Administered 2013-03-12: 2 via RESPIRATORY_TRACT
  Filled 2013-03-12: qty 6.7

## 2013-03-12 MED ORDER — ALBUTEROL SULFATE HFA 108 (90 BASE) MCG/ACT IN AERS: 1.0000 | INHALATION_SPRAY | RESPIRATORY_TRACT | Status: DC | PRN

## 2013-03-12 NOTE — ED Notes (Signed)
Pt arrived to the ED with a chief complaint of an asthma attack.  Pt is without an inhaler.  Pt has had symptoms for about 24 hours
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
## 2013-07-19 ENCOUNTER — Ambulatory Visit: Payer: Self-pay

## 2013-08-04 ENCOUNTER — Encounter: Payer: Self-pay | Admitting: Internal Medicine

## 2013-08-23 ENCOUNTER — Ambulatory Visit: Payer: Self-pay

## 2013-11-18 ENCOUNTER — Other Ambulatory Visit: Payer: Self-pay | Admitting: *Deleted

## 2013-11-18 DIAGNOSIS — I10 Essential (primary) hypertension: Secondary | ICD-10-CM

## 2013-11-18 NOTE — Telephone Encounter (Signed)
Called pt to scheduled an appt; no answer, left message for pt to call the clinic to schedule an appt. I will also send message to front office.

## 2013-11-21 MED ORDER — HYDROCHLOROTHIAZIDE 25 MG PO TABS: 25.0000 mg | ORAL_TABLET | Freq: Every day | ORAL | Status: DC

## 2013-11-21 NOTE — Telephone Encounter (Signed)
Pt scheduled an appt on 11/28/13 w/Dr Bosie ClosSchooler.

## 2013-11-28 ENCOUNTER — Ambulatory Visit: Payer: Self-pay | Admitting: Internal Medicine

## 2014-01-09 ENCOUNTER — Emergency Department (HOSPITAL_COMMUNITY)
Admission: EM | Admit: 2014-01-09 | Discharge: 2014-01-09 | Disposition: A | Discharge status: Home / Self Care | Payer: Self-pay | Attending: Emergency Medicine | Admitting: Emergency Medicine

## 2014-01-09 ENCOUNTER — Encounter (HOSPITAL_COMMUNITY): Payer: Self-pay | Admitting: Emergency Medicine

## 2014-01-09 DIAGNOSIS — J45901 Unspecified asthma with (acute) exacerbation: Secondary | ICD-10-CM | POA: Insufficient documentation

## 2014-01-09 DIAGNOSIS — J069 Acute upper respiratory infection, unspecified: Secondary | ICD-10-CM | POA: Insufficient documentation

## 2014-01-09 DIAGNOSIS — I10 Essential (primary) hypertension: Secondary | ICD-10-CM | POA: Insufficient documentation

## 2014-01-09 DIAGNOSIS — Z87891 Personal history of nicotine dependence: Secondary | ICD-10-CM | POA: Insufficient documentation

## 2014-01-09 DIAGNOSIS — J45909 Unspecified asthma, uncomplicated: Secondary | ICD-10-CM

## 2014-01-09 DIAGNOSIS — Z872 Personal history of diseases of the skin and subcutaneous tissue: Secondary | ICD-10-CM | POA: Insufficient documentation

## 2014-01-09 DIAGNOSIS — Z79899 Other long term (current) drug therapy: Secondary | ICD-10-CM | POA: Insufficient documentation

## 2014-01-09 DIAGNOSIS — Z8719 Personal history of other diseases of the digestive system: Secondary | ICD-10-CM | POA: Insufficient documentation

## 2014-01-09 MED ORDER — IPRATROPIUM BROMIDE 0.02 % IN SOLN
0.5000 mg | Freq: Once | RESPIRATORY_TRACT | Status: AC
  Administered 2014-01-09: 0.5 mg via RESPIRATORY_TRACT
  Filled 2014-01-09: qty 2.5

## 2014-01-09 MED ORDER — BENZONATATE 100 MG PO CAPS: 100.0000 mg | ORAL_CAPSULE | Freq: Three times a day (TID) | ORAL | Status: DC

## 2014-01-09 MED ORDER — PREDNISONE 20 MG PO TABS
ORAL_TABLET | ORAL | Status: DC
Start: 2014-01-09 — End: 2014-03-11

## 2014-01-09 MED ORDER — ALBUTEROL SULFATE (2.5 MG/3ML) 0.083% IN NEBU
2.5000 mg | INHALATION_SOLUTION | Freq: Once | RESPIRATORY_TRACT | Status: AC
  Administered 2014-01-09: 2.5 mg via RESPIRATORY_TRACT
  Filled 2014-01-09: qty 3

## 2014-01-09 MED ORDER — PREDNISONE 20 MG PO TABS
60.0000 mg | ORAL_TABLET | Freq: Once | ORAL | Status: AC
  Administered 2014-01-09: 60 mg via ORAL
  Filled 2014-01-09: qty 3

## 2014-01-09 MED ORDER — IPRATROPIUM-ALBUTEROL 0.5-2.5 (3) MG/3ML IN SOLN
3.0000 mL | Freq: Once | RESPIRATORY_TRACT | Status: AC
  Administered 2014-01-09: 3 mL via RESPIRATORY_TRACT
  Filled 2014-01-09: qty 3

## 2014-01-09 NOTE — Discharge Instructions (Signed)
Bronchitis °Bronchitis is inflammation of the airways that extend from the windpipe into the lungs (bronchi). The inflammation often causes mucus to develop, which leads to a cough. If the inflammation becomes severe, it may cause shortness of breath. °CAUSES  °Bronchitis may be caused by:  °· Viral infections.   °· Bacteria.   °· Cigarette smoke.   °· Allergens, pollutants, and other irritants.   °SIGNS AND SYMPTOMS  °The most common symptom of bronchitis is a frequent cough that produces mucus. Other symptoms include: °· Fever.   °· Body aches.   °· Chest congestion.   °· Chills.   °· Shortness of breath.   °· Sore throat.   °DIAGNOSIS  °Bronchitis is usually diagnosed through a medical history and physical exam. Tests, such as chest X-rays, are sometimes done to rule out other conditions.  °TREATMENT  °You may need to avoid contact with whatever caused the problem (smoking, for example). Medicines are sometimes needed. These may include: °· Antibiotics. These may be prescribed if the condition is caused by bacteria. °· Cough suppressants. These may be prescribed for relief of cough symptoms.   °· Inhaled medicines. These may be prescribed to help open your airways and make it easier for you to breathe.   °· Steroid medicines. These may be prescribed for those with recurrent (chronic) bronchitis. °HOME CARE INSTRUCTIONS °· Get plenty of rest.   °· Drink enough fluids to keep your urine clear or pale yellow (unless you have a medical condition that requires fluid restriction). Increasing fluids may help thin your secretions and will prevent dehydration.   °· Only take over-the-counter or prescription medicines as directed by your health care provider. °· Only take antibiotics as directed. Make sure you finish them even if you start to feel better. °· Avoid secondhand smoke, irritating chemicals, and strong fumes. These will make bronchitis worse. If you are a smoker, quit smoking. Consider using nicotine gum or  skin patches to help control withdrawal symptoms. Quitting smoking will help your lungs heal faster.   °· Put a cool-mist humidifier in your bedroom at night to moisten the air. This may help loosen mucus. Change the water in the humidifier daily. You can also run the hot water in your shower and sit in the bathroom with the door closed for 5 10 minutes.   °· Follow up with your health care provider as directed.   °· Wash your hands frequently to avoid catching bronchitis again or spreading an infection to others.   °SEEK MEDICAL CARE IF: °Your symptoms do not improve after 1 week of treatment.  °SEEK IMMEDIATE MEDICAL CARE IF: °· Your fever increases. °· You have chills.   °· You have chest pain.   °· You have worsening shortness of breath.   °· You have bloody sputum. °· You faint.   °· You have lightheadedness. °· You have a severe headache.   °· You vomit repeatedly. °MAKE SURE YOU:  °· Understand these instructions. °· Will watch your condition. °· Will get help right away if you are not doing well or get worse. °Document Released: 09/15/2005 Document Revised: 07/06/2013 Document Reviewed: 05/10/2013 °[employer]® Patient Information ©2014 [employer]. ° °Asthma, Adult °Asthma is a recurring condition in which the airways tighten and narrow. Asthma can make it difficult to breathe. It can cause coughing, wheezing, and shortness of breath. Asthma episodes (also called asthma attacks) range from minor to life-threatening. Asthma cannot be cured, but medicines and lifestyle changes can help control it. °CAUSES °Asthma is believed to be caused by inherited (genetic) and environmental factors, but its exact cause is unknown. Asthma may be   triggered by allergens, lung infections, or irritants in the air. Asthma triggers are different for each person. Common triggers include:  °· Animal dander. °· Dust mites. °· Cockroaches. °· Pollen from trees or grass. °· Mold. °· Smoke. °· Air pollutants such as dust, household  cleaners, hair sprays, aerosol sprays, paint fumes, strong chemicals, or strong odors. °· Cold air, weather changes, and winds (which increase molds and pollens in the air). °· Strong emotional expressions such as crying or laughing hard. °· Stress. °· Certain medicines (such as aspirin) or types of drugs (such as beta-blockers). °· Sulfites in foods and drinks. Foods and drinks that may contain sulfites include dried fruit, potato chips, and sparkling grape juice. °· Infections or inflammatory conditions such as the flu, a cold, or an inflammation of the nasal membranes (rhinitis). °· Gastroesophageal reflux disease (GERD). °· Exercise or strenuous activity. °SYMPTOMS °Symptoms may occur immediately after asthma is triggered or many hours later. Symptoms include: °· Wheezing. °· Excessive nighttime or early morning coughing. °· Frequent or severe coughing with a common cold. °· Chest tightness. °· Shortness of breath. °DIAGNOSIS  °The diagnosis of asthma is made by a review of your medical history and a physical exam. Tests may also be performed. These may include: °· Lung function studies. These tests show how much air you breath in and out. °· Allergy tests. °· Imaging tests such as X-rays. °TREATMENT  °Asthma cannot be cured, but it can usually be controlled. Treatment involves identifying and avoiding your asthma triggers. It also involves medicines. There are 2 classes of medicine used for asthma treatment:  °· Controller medicines. These prevent asthma symptoms from occurring. They are usually taken every day. °· Reliever or rescue medicines. These quickly relieve asthma symptoms. They are used as needed and provide short-term relief. °Your health care provider will help you create an asthma action plan. An asthma action plan is a written plan for managing and treating your asthma attacks. It includes a list of your asthma triggers and how they may be avoided. It also includes information on when medicines  should be taken and when their dosage should be changed. An action plan may also involve the use of a device called a peak flow meter. A peak flow meter measures how well the lungs are working. It helps you monitor your condition. °HOME CARE INSTRUCTIONS  °· Take medicine as directed by your health care provider. Speak with your health care provider if you have questions about how or when to take the medicines. °· Use a peak flow meter as directed by your health care provider. Record and keep track of readings. °· Understand and use the action plan to help minimize or stop an asthma attack without needing to seek medical care. °· Control your home environment in the following ways to help prevent asthma attacks: °· Do not smoke. Avoid being exposed to secondhand smoke. °· Change your heating and air conditioning filter regularly. °· Limit your use of fireplaces and wood stoves. °· Get rid of pests (such as roaches and mice) and their droppings. °· Throw away plants if you see mold on them. °· Clean your floors and dust regularly. Use unscented cleaning products. °· Try to have someone else vacuum for you regularly. Stay out of rooms while they are being vacuumed and for a short while afterward. If you vacuum, use a dust mask from a hardware store, a double-layered or microfilter vacuum cleaner bag, or a vacuum cleaner with a HEPA   filter. °· Replace carpet with wood, tile, or vinyl flooring. Carpet can trap dander and dust. °· Use allergy-proof pillows, mattress covers, and box spring covers. °· Wash bed sheets and blankets every week in hot water and dry them in a dryer. °· Use blankets that are made of polyester or cotton. °· Clean bathrooms and kitchens with bleach. If possible, have someone repaint the walls in these rooms with mold-resistant paint. Keep out of the rooms that are being cleaned and painted. °· Wash hands frequently. °SEEK MEDICAL CARE IF:  °· You have wheezing, shortness of breath, or a cough even  if taking medicine to prevent attacks. °· The colored mucus you cough up (sputum) is thicker than usual. °· Your sputum changes from clear or white to yellow, green, gray, or bloody. °· You have any problems that may be related to the medicines you are taking (such as a rash, itching, swelling, or trouble breathing). °· You are using a reliever medicine more than 2 3 times per week. °· Your peak flow is still at 50 79% of you personal best after following your action plan for 1 hour. °SEEK IMMEDIATE MEDICAL CARE IF:  °· You seem to be getting worse and are unresponsive to treatment during an asthma attack. °· You are short of breath even at rest. °· You get short of breath when doing very little physical activity. °· You have difficulty eating, drinking, or talking due to asthma symptoms. °· You develop chest pain. °· You develop a fast heartbeat. °· You have a bluish color to your lips or fingernails. °· You are lightheaded, dizzy, or faint. °· Your peak flow is less than 50% of your personal best. °· You have a fever or persistent symptoms for more than 2 3 days. °· You have a fever and symptoms suddenly get worse. °MAKE SURE YOU:  °· Understand these instructions. °· Will watch your condition. °· Will get help right away if you are not doing well or get worse. °Document Released: 09/15/2005 Document Revised: 05/18/2013 Document Reviewed: 04/14/2013 °[employer]® Patient Information ©2014 [employer]. ° °
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
## 2014-01-09 NOTE — ED Notes (Signed)
The pt is having difficulty breathing. Today.  Hx of asthma her inhaler is not helping

## 2014-01-19 ENCOUNTER — Ambulatory Visit: Payer: Self-pay

## 2014-02-06 ENCOUNTER — Other Ambulatory Visit: Payer: Self-pay | Admitting: *Deleted

## 2014-02-06 ENCOUNTER — Ambulatory Visit: Payer: Self-pay | Admitting: Internal Medicine

## 2014-02-06 DIAGNOSIS — I10 Essential (primary) hypertension: Secondary | ICD-10-CM

## 2014-02-06 MED ORDER — HYDROCHLOROTHIAZIDE 25 MG PO TABS: 25.0000 mg | ORAL_TABLET | Freq: Every day | ORAL | Status: DC

## 2014-02-10 ENCOUNTER — Ambulatory Visit: Payer: Self-pay | Admitting: Internal Medicine

## 2014-02-21 ENCOUNTER — Encounter: Payer: Self-pay | Admitting: Internal Medicine

## 2014-02-21 ENCOUNTER — Ambulatory Visit: Payer: Self-pay | Admitting: Internal Medicine

## 2014-02-24 ENCOUNTER — Encounter: Payer: Self-pay | Admitting: Internal Medicine

## 2014-02-24 ENCOUNTER — Ambulatory Visit: Payer: Self-pay | Admitting: Internal Medicine

## 2014-03-10 ENCOUNTER — Encounter (HOSPITAL_COMMUNITY): Payer: Self-pay | Admitting: Emergency Medicine

## 2014-03-10 DIAGNOSIS — Z872 Personal history of diseases of the skin and subcutaneous tissue: Secondary | ICD-10-CM | POA: Insufficient documentation

## 2014-03-10 DIAGNOSIS — Z87891 Personal history of nicotine dependence: Secondary | ICD-10-CM | POA: Insufficient documentation

## 2014-03-10 DIAGNOSIS — Z8719 Personal history of other diseases of the digestive system: Secondary | ICD-10-CM | POA: Insufficient documentation

## 2014-03-10 DIAGNOSIS — I10 Essential (primary) hypertension: Secondary | ICD-10-CM | POA: Insufficient documentation

## 2014-03-10 DIAGNOSIS — J45909 Unspecified asthma, uncomplicated: Secondary | ICD-10-CM | POA: Insufficient documentation

## 2014-03-10 DIAGNOSIS — M79609 Pain in unspecified limb: Secondary | ICD-10-CM | POA: Insufficient documentation

## 2014-03-10 DIAGNOSIS — Z79899 Other long term (current) drug therapy: Secondary | ICD-10-CM | POA: Insufficient documentation

## 2014-03-10 DIAGNOSIS — R252 Cramp and spasm: Secondary | ICD-10-CM | POA: Insufficient documentation

## 2014-03-10 NOTE — ED Notes (Signed)
Pt reports left calf pain for 2 weeks. No injury. Pt denies sob, recent travel. Pt sts it feels "tight".

## 2014-03-11 ENCOUNTER — Ambulatory Visit (HOSPITAL_COMMUNITY): Payer: Self-pay | Attending: Emergency Medicine

## 2014-03-11 ENCOUNTER — Emergency Department (HOSPITAL_COMMUNITY)
Admission: EM | Admit: 2014-03-11 | Discharge: 2014-03-11 | Disposition: A | Discharge status: Home / Self Care | Payer: Self-pay | Attending: Emergency Medicine | Admitting: Emergency Medicine

## 2014-03-11 DIAGNOSIS — M79606 Pain in leg, unspecified: Secondary | ICD-10-CM

## 2014-03-11 DIAGNOSIS — R252 Cramp and spasm: Secondary | ICD-10-CM

## 2014-03-11 LAB — D-DIMER, QUANTITATIVE: D-Dimer, Quant: 0.32 ug/mL-FEU (ref 0.00–0.48)

## 2014-03-11 MED ORDER — ENOXAPARIN SODIUM 150 MG/ML ~~LOC~~ SOLN
1.5000 mg/kg | Freq: Once | SUBCUTANEOUS | Status: AC
  Administered 2014-03-11: 190 mg via SUBCUTANEOUS
  Filled 2014-03-11: qty 2

## 2014-03-11 MED ORDER — CYCLOBENZAPRINE HCL 10 MG PO TABS: 10.0000 mg | ORAL_TABLET | Freq: Three times a day (TID) | ORAL | Status: DC | PRN

## 2014-03-11 NOTE — ED Provider Notes (Signed)
Medical screening examination/treatment/procedure(s) were performed by non-physician practitioner and as supervising physician I was immediately available for consultation/collaboration.   EKG Interpretation None       Marsden Zaino M Webster Patrone, MD 03/11/14 0656 

## 2014-03-11 NOTE — Discharge Instructions (Signed)
Use warm compresses for 20-30 minutes at a time with gentle stretching to help with symptoms.  Follow up later today as instructed to have further testing of your leg with Ultra sound tests to make sure there is no blood clot in your leg. Return for any changing or worsening symptoms.  Deep Vein Thrombosis A deep vein thrombosis (DVT) is a blood clot that develops in the deep, larger veins of the leg, arm, or pelvis. These are more dangerous than clots that might form in veins near the surface of the body. A DVT can lead to complications if the clot breaks off and travels in the bloodstream to the lungs.  A DVT can damage the valves in your leg veins, so that instead of flowing upward, the blood pools in the lower leg. This is called post-thrombotic syndrome, and it can result in pain, swelling, discoloration, and sores on the leg. CAUSES Usually, several things contribute to blood clots forming. Contributing factors include:  The flow of blood slows down.  The inside of the vein is damaged in some way.  You have a condition that makes blood clot more easily. RISK FACTORS Some people are more likely than others to develop blood clots. Risk factors include:   Older age, especially over 47 years of age.  Having a family history of blood clots or if you have already had a blot clot.  Having major or lengthy surgery. This is especially true for surgery on the hip, knee, or belly (abdomen). Hip surgery is particularly high risk.  Breaking a hip or leg.  Sitting or lying still for a long time. This includes long-distance travel, paralysis, or recovery from an illness or surgery.  Having cancer or cancer treatment.  Having a long, thin tube (catheter) placed inside a vein during a medical procedure.  Being overweight (obese).  Pregnancy and childbirth.  Hormone changes make the blood clot more easily during pregnancy.  The fetus puts pressure on the veins of the pelvis.  There is a  risk of injury to veins during delivery or a caesarean. The risk is highest just after childbirth.  Medicines with the female hormone estrogen. This includes birth control pills and hormone replacement therapy.  Smoking.  Other circulation or heart problems.  SIGNS AND SYMPTOMS When a clot forms, it can either partially or totally block the blood flow in that vein. Symptoms of a DVT can include:  Swelling of the leg or arm, especially if one side is much worse.  Warmth and redness of the leg or arm, especially if one side is much worse.  Pain in an arm or leg. If the clot is in the leg, symptoms may be more noticeable or worse when standing or walking. The symptoms of a DVT that has traveled to the lungs (pulmonary embolism, PE) usually start suddenly and include:  Shortness of breath.  Coughing.  Coughing up blood or blood-tinged phlegm.  Chest pain. The chest pain is often worse with deep breaths.  Rapid heartbeat. Anyone with these symptoms should get emergency medical treatment right away. Call your local emergency services (911 in the [place] if you have these symptoms. DIAGNOSIS If a DVT is suspected, your health care provider will take a full medical history and perform a physical exam. Tests that also may be required include:  Blood tests, including studies of the clotting properties of the blood.  Ultrasonography to see if you have clots in your legs or lungs.  X-rays to show  the flow of blood when dye is injected into the veins (venography).  Studies of your lungs if you have any chest symptoms. PREVENTION  Exercise the legs regularly. Take a brisk 30-minute walk every day.  Maintain a weight that is appropriate for your height.  Avoid sitting or lying in bed for long periods of time without moving your legs.  Women, particularly those over the age of 35 years, should consider the risks and benefits of taking estrogen medicines, including birth control  pills.  Do not smoke, especially if you take estrogen medicines.  Long-distance travel can increase your risk of DVT. You should exercise your legs by walking or pumping the muscles every hour.  In-hospital prevention:  Many of the risk factors above relate to situations that exist with hospitalization, either for illness, injury, or elective surgery.  Your health care provider will assess you for the need for venous thromboembolism prophylaxis when you are admitted to the hospital. If you are having surgery, your surgeon will assess you the day of or day after surgery.  Prevention may include medical and nonmedical measures. TREATMENT Once identified, a DVT can be treated. It can also be prevented in some circumstances. Once you have had a DVT, you may be at increased risk for a DVT in the future. The most common treatment for DVT is blood thinning (anticoagulant) medicine, which reduces the blood's tendency to clot. Anticoagulants can stop new blood clots from forming and stop old ones from growing. They cannot dissolve existing clots. Your body does this by itself over time. Anticoagulants can be given by mouth, by IV access, or by injection. Your health care provider will determine the best program for you. Other medicines or treatments that may be used are:  Heparin or related medicines (low molecular weight heparin) are usually the first treatment for a blood clot. They act quickly. However, they cannot be taken orally.  Heparin can cause a fall in a component of blood that stops bleeding and forms blood clots (platelets). You will be monitored with blood tests to be sure this does not occur.  Warfarin is an anticoagulant that can be swallowed. It takes a few days to start working, so usually heparin or related medicines are used in combination. Once warfarin is working, heparin is usually stopped.  Less commonly, clot dissolving drugs (thrombolytics) are used to dissolve a DVT. They  carry a high risk of bleeding, so they are used mainly in severe cases, where your life or a limb is threatened.  Very rarely, a blood clot in the leg needs to be removed surgically.  If you are unable to take anticoagulants, your health care provider may arrange for you to have a filter placed in a main vein in your abdomen. This filter prevents clots from traveling to your lungs. HOME CARE INSTRUCTIONS  Take all medicines prescribed by your health care provider. Only take over-the-counter or prescription medicines for pain, fever, or discomfort as directed by your health care provider.  Warfarin. Most people will continue taking warfarin after hospital discharge. Your health care provider will advise you on the length of treatment (usually 3 6 months, sometimes lifelong).  Too much and too little warfarin are both dangerous. Too much warfarin increases the risk of bleeding. Too little warfarin continues to allow the risk for blood clots. While taking warfarin, you will need to have regular blood tests to measure your blood clotting time. These blood tests usually include both the prothrombin time (PT)  and international normalized ratio (INR) tests. The PT and INR results allow your health care provider to adjust your dose of warfarin. The dose can change for many reasons. It is critically important that you take warfarin exactly as prescribed, and that you have your PT and INR levels drawn exactly as directed.  Many foods, especially foods high in vitamin K, can interfere with warfarin and affect the PT and INR results. Foods high in vitamin K include spinach, kale, broccoli, cabbage, collard and turnip greens, brussel sprouts, peas, cauliflower, seaweed, and parsley as well as beef and pork liver, green tea, and soybean oil. You should eat a consistent amount of foods high in vitamin K. Avoid major changes in your diet, or notify your health care provider before changing your diet. Arrange a visit  with a dietitian to answer your questions.  Many medicines can interfere with warfarin and affect the PT and INR results. You must tell your health care provider about any and all medicines you take. This includes all vitamins and supplements. Be especially cautious with aspirin and anti-inflammatory medicines. Ask your health care provider before taking these. Do not take or discontinue any prescribed or over-the-counter medicine except on the advice of your health care provider or pharmacist.  Warfarin can have side effects, primarily excessive bruising or bleeding. You will need to hold pressure over cuts for longer than usual. Your health care provider or pharmacist will discuss other potential side effects.  Alcohol can change the body's ability to handle warfarin. It is best to avoid alcoholic drinks or consume only very small amounts while taking warfarin. Notify your health care provider if you change your alcohol intake.  Notify your dentist or other health care providers before procedures.  Activity. Ask your health care provider how soon you can go back to normal activities. It is important to stay active to prevent blood clots. If you are on anticoagulant medicine, avoid contact sports.  Exercise. It is very important to exercise. This is especially important while traveling, sitting, or standing for long periods of time. Exercise your legs by walking or by pumping the muscles frequently. Take frequent walks.  Compression stockings. These are tight elastic stockings that apply pressure to the lower legs. This pressure can help keep the blood in the legs from clotting. You may need to wear compression stockings at home to help prevent a DVT.  Do not smoke. If you smoke, quit. Ask your health care provider for help with quitting smoking.  Learn as much as you can about DVT. Knowing more about the condition should help you keep it from coming back.  Wear a medical alert bracelet or carry  a medical alert card. SEEK MEDICAL CARE IF:  You notice a rapid heartbeat.  You feel weaker or more tired than usual.  You feel faint.  You notice increased bruising.  You feel your symptoms are not getting better in the time expected.  You believe you are having side effects of medicine. SEEK IMMEDIATE MEDICAL CARE IF:  You have chest pain.  You have trouble breathing.  You have new or increased swelling or pain in one leg.  You cough up blood.  You notice blood in vomit, in a bowel movement, or in urine. MAKE SURE YOU:  Understand these instructions.  Will watch your condition.  Will get help right away if you are not doing well or get worse. Document Released: 09/15/2005 Document Revised: 07/06/2013 Document Reviewed: 05/23/2013 [employer] Patient Information 2014  [employer]. ° °
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
## 2014-04-25 ENCOUNTER — Ambulatory Visit (INDEPENDENT_AMBULATORY_CARE_PROVIDER_SITE_OTHER): Payer: Self-pay | Admitting: Internal Medicine

## 2014-04-25 VITALS — BP 129/83 | HR 76 | Temp 98.0°F | Ht 67.0 in | Wt 273.7 lb

## 2014-04-25 DIAGNOSIS — J45909 Unspecified asthma, uncomplicated: Secondary | ICD-10-CM

## 2014-04-25 DIAGNOSIS — J4520 Mild intermittent asthma, uncomplicated: Secondary | ICD-10-CM

## 2014-04-25 DIAGNOSIS — E669 Obesity, unspecified: Secondary | ICD-10-CM

## 2014-04-25 DIAGNOSIS — I10 Essential (primary) hypertension: Secondary | ICD-10-CM

## 2014-04-25 DIAGNOSIS — Z0000 Encounter for general adult medical examination without abnormal findings: Secondary | ICD-10-CM

## 2014-04-25 LAB — BASIC METABOLIC PANEL
BUN: 11 mg/dL (ref 6–23)
CO2: 27 mEq/L (ref 19–32)
CREATININE: 0.81 mg/dL (ref 0.50–1.10)
Calcium: 9 mg/dL (ref 8.4–10.5)
Chloride: 102 mEq/L (ref 96–112)
Glucose, Bld: 68 mg/dL — ABNORMAL LOW (ref 70–99)
Potassium: 3.7 mEq/L (ref 3.5–5.3)
Sodium: 136 mEq/L (ref 135–145)

## 2014-04-25 MED ORDER — HYDROCHLOROTHIAZIDE 25 MG PO TABS: 25.0000 mg | ORAL_TABLET | Freq: Every day | ORAL | Status: DC

## 2014-04-25 NOTE — Assessment & Plan Note (Signed)
Rarely uses her inhaler except occasionally during the winter. Stable. No SOB, cough or wheezing.

## 2014-04-25 NOTE — Assessment & Plan Note (Signed)
Cannot remember the last time she had a pap smear.   Plan- Pap smear today.

## 2014-04-25 NOTE — Assessment & Plan Note (Addendum)
BP Readings from Last 3 Encounters:  04/25/14 129/83  03/11/14 117/64  01/09/14 128/72    Lab Results  Component Value Date   NA 136 10/11/2012   K 4.1 10/11/2012   CREATININE 0.91 10/11/2012    Assessment: Blood pressure control:  Controlled Progress toward BP goal:   At goal Comments: Complaint with Hydrochlorothiazide- 25mg  daily. Stopped smoking cigs in 1990.  Plan: Medications:  continue current medications Educational resources provided:  None Self management tools provided:  None Other plans: Bmet today.

## 2014-04-25 NOTE — Patient Instructions (Signed)
Congratulations on your weight loss, keep this up. This will also help with your blood pressure.  Continue watching your diet, eat healthy, avoid fried foods- eat more of baked food,and continue with exercise. Loosing weight will also help with your blood sugar and prevent you from having diabetes.

## 2014-04-25 NOTE — Progress Notes (Signed)
Patient ID: Lynn Bond, female   DOB: May 13, 1966, 48 y.o.   MRN: [id number]   Subjective:   Patient ID: Lynn Bond female   DOB: May 13, 1966 48 y.o.   MRN: [id number]  HPI: Lynn Bond is a 48 y.o. with PMH of HTN, Asthma, Obesity, presented today for routine follow up visit. Please see problem based charting for review of pts chronic medical conditions.  Past Medical History  Diagnosis Date  . Asthma   . Hypertension   . ECZEMA 03/09/2009    Qualifier: Diagnosis of  By: Andrey CampanileWilson  MD, Vikki PortsValerie    . GERD 03/09/2009    Qualifier: Diagnosis of  By: Andrey CampanileWilson  MD, Vikki PortsValerie     Current Outpatient Prescriptions  Medication Sig Dispense Refill  . albuterol (PROVENTIL HFA;VENTOLIN HFA) 108 (90 BASE) MCG/ACT inhaler Inhale 1-2 puffs into the lungs every 4 (four) hours as needed for wheezing.  1 Inhaler  0  . cyclobenzaprine (FLEXERIL) 10 MG tablet Take 1 tablet (10 mg total) by mouth 3 (three) times daily as needed for muscle spasms.  30 tablet  0  . hydrochlorothiazide (HYDRODIURIL) 25 MG tablet Take 1 tablet (25 mg total) by mouth daily. For high blood pressure  90 tablet  3   No current facility-administered medications for this visit.   No family history on file. History   Social History  . Marital Status: Married    Spouse Name: N/A    Number of Children: N/A  . Years of Education: N/A   Social History Main Topics  . Smoking status: Former Smoker    Types: Cigarettes    Quit date: 10/20/1988  . Smokeless tobacco: Not on file     Comment: quit 7538yrs ago  . Alcohol Use: No  . Drug Use: No  . Sexual Activity: Yes   Other Topics Concern  . Not on file   Social History Narrative  . No narrative on file   Review of Systems: CONSTITUTIONAL- No Fever, weightloss, night sweat or change in appetite. SKIN- No Rash, colour changes or itching. HEAD- No Headache or dizziness. Mouth/throat- No Sorethroat, dentures, or bleeding gums. RESPIRATORY- No Cough or  SOB. CARDIAC- No Palpitations, DOE, PND or chest pain. GI- No nausea, vomiting, diarrhoea, constipation, abd pain. URINARY- No Frequency, urgency, straining or dysuria. NEUROLOGIC- No Numbness, syncope, seizures or burning. [facility]- Denies depression or anxiety.  Objective:  Physical Exam: Filed Vitals:   04/25/14 1608  BP: 129/83  Pulse: 76  Temp: 98 F (36.7 C)  TempSrc: Oral  Height: 5\' 7"  (1.702 m)  Weight: 273 lb 11.2 oz (124.15 kg)  SpO2: 100%   GENERAL- alert, co-operative, obese, appears as stated age, not in any distress. HEENT- Atraumatic, normocephalic, PERRL, EOMI, oral mucosa appears moist, neck supple. CARDIAC- RRR, no murmurs, rubs or gallops. RESP- Moving equal volumes of air, and clear to auscultation bilaterally, no wheezes or crackles. ABDOMEN- Soft, nontender, no guarding or rebound, no palpable masses or organomegaly, bowel sounds present. NEURO- No obvious Cr N abnormality, strenght upper and lower extremities- 5/5,  Gait- Normal. EXTREMITIES- pulse 2+, symmetric, no pedal edema. SKIN- Warm, dry, No rash or lesion. PSYCH- Normal mood and affect, appropriate thought content and speech.  Assessment & Plan:   The patient's case and plan of care was discussed with attending physician, Dr. Tenny CrawN. Narendra.  Please see problem based charting for assessment and plan.

## 2014-04-25 NOTE — Assessment & Plan Note (Signed)
Weight today- 273Lbs, down from 303Lbs  2011. Pt has been actively going to the gym and eating healthy.  Plan- Congratulated on weightloss, encouraged to continue.

## 2014-04-26 NOTE — Progress Notes (Signed)
INTERNAL MEDICINE TEACHING ATTENDING ADDENDUM - Jakhari Space, MD: I reviewed and discussed at the time of visit with the resident Dr. Emokpae, the patient's medical history, physical examination, diagnosis and results of pertinent tests and treatment and I agree with the patient's care as documented.  

## 2014-04-27 LAB — CYTOLOGY - PAP

## 2014-12-24 ENCOUNTER — Emergency Department (HOSPITAL_COMMUNITY)
Admission: EM | Admit: 2014-12-24 | Discharge: 2014-12-24 | Disposition: A | Discharge status: Home / Self Care | Payer: Self-pay | Attending: Emergency Medicine | Admitting: Emergency Medicine

## 2014-12-24 ENCOUNTER — Encounter (HOSPITAL_COMMUNITY): Payer: Self-pay | Admitting: Emergency Medicine

## 2014-12-24 DIAGNOSIS — I10 Essential (primary) hypertension: Secondary | ICD-10-CM | POA: Insufficient documentation

## 2014-12-24 DIAGNOSIS — Z87891 Personal history of nicotine dependence: Secondary | ICD-10-CM | POA: Insufficient documentation

## 2014-12-24 DIAGNOSIS — J45901 Unspecified asthma with (acute) exacerbation: Secondary | ICD-10-CM | POA: Insufficient documentation

## 2014-12-24 DIAGNOSIS — Z8719 Personal history of other diseases of the digestive system: Secondary | ICD-10-CM | POA: Insufficient documentation

## 2014-12-24 DIAGNOSIS — Z872 Personal history of diseases of the skin and subcutaneous tissue: Secondary | ICD-10-CM | POA: Insufficient documentation

## 2014-12-24 DIAGNOSIS — Z79899 Other long term (current) drug therapy: Secondary | ICD-10-CM | POA: Insufficient documentation

## 2014-12-24 DIAGNOSIS — J069 Acute upper respiratory infection, unspecified: Secondary | ICD-10-CM | POA: Insufficient documentation

## 2014-12-24 MED ORDER — AEROCHAMBER PLUS W/MASK MISC
1.0000 | Freq: Once | Status: AC
  Administered 2014-12-24: 1

## 2014-12-24 MED ORDER — ALBUTEROL SULFATE HFA 108 (90 BASE) MCG/ACT IN AERS
2.0000 | INHALATION_SPRAY | RESPIRATORY_TRACT | Status: AC
  Administered 2014-12-24: 2 via RESPIRATORY_TRACT
  Filled 2014-12-24: qty 6.7

## 2014-12-24 MED ORDER — PREDNISONE 20 MG PO TABS
60.0000 mg | ORAL_TABLET | Freq: Once | ORAL | Status: AC
  Administered 2014-12-24: 60 mg via ORAL
  Filled 2014-12-24: qty 3

## 2014-12-24 MED ORDER — PREDNISONE 20 MG PO TABS: 40.0000 mg | ORAL_TABLET | Freq: Every day | ORAL | Status: DC

## 2014-12-24 MED ORDER — AEROCHAMBER PLUS W/MASK MISC
Status: AC
  Filled 2014-12-24: qty 1

## 2014-12-24 MED ORDER — IPRATROPIUM-ALBUTEROL 0.5-2.5 (3) MG/3ML IN SOLN
3.0000 mL | Freq: Once | RESPIRATORY_TRACT | Status: AC
  Administered 2014-12-24: 3 mL via RESPIRATORY_TRACT
  Filled 2014-12-24: qty 3

## 2014-12-24 MED ORDER — AEROCHAMBER PLUS FLO-VU MEDIUM MISC
1.0000 | Freq: Once | Status: DC
  Filled 2014-12-24: qty 1
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
## 2014-12-24 NOTE — ED Notes (Signed)
Pt reports since Saturday she has had runny nose, cough and wheezing. Hx asthma and does not have inhaler. sts she tried to use nebulizer tx without relief. Breath sounds clear at present.

## 2014-12-24 NOTE — Discharge Instructions (Signed)
°Emergency Department Resource Guide °1) Find a Doctor and Pay Out of Pocket °Although you won't have to find out who is covered by your insurance plan, it is a good idea to ask around and get recommendations. You will then need to call the office and see if the doctor you have chosen will accept you as a new patient and what types of options they offer for patients who are self-pay. Some doctors offer discounts or will set up payment plans for their patients who do not have insurance, but you will need to ask so you aren't surprised when you get to your appointment. ° °2) Contact Your Local Health Department °Not all health departments have doctors that can see patients for sick visits, but many do, so it is worth a call to see if yours does. If you don't know where your local health department is, you can check in your phone book. The CDC also has a tool to help you locate your state's health department, and many state websites also have listings of all of their local health departments. ° °3) Find a Walk-in Clinic °If your illness is not likely to be very severe or complicated, you may want to try a walk in clinic. These are popping up all over the country in pharmacies, drugstores, and shopping centers. They're usually staffed by nurse practitioners or physician assistants that have been trained to treat common illnesses and complaints. They're usually fairly quick and inexpensive. However, if you have serious medical issues or chronic medical problems, these are probably not your best option. ° °No Primary Care Doctor: °- Call Health Connect at  832-8000 - they can help you locate a primary care doctor that  accepts your insurance, provides certain services, etc. °- Physician Referral Service- 1-800-533-3463 ° °Chronic Pain Problems: °Organization         Address  Phone   Notes  °[facility]  (336) 297-2271 Patients need to be referred by their primary care doctor.  ° °Medication  Assistance: °Organization         Address  Phone   Notes  °[place] [facility] [address] °[place], [place] [address] (336) 641-8030 --Must be a resident of [place] °-- Must have NO insurance coverage whatsoever (no Medicaid/ Medicare, etc.) °-- The pt. MUST have a primary care doctor that directs their care regularly and follows them in the community °  °[facility]  (866) 331-1348   °[employer]  (888) 892-1162   ° °Agencies that provide inexpensive medical care: °Organization         Address  Phone   Notes  °[facility]  (336) 832-8035   °[facility]    (336) 832-7272   °[facility] [address] °[place], [place] [address] (336) 832-4777   °[facility] [address], °[place] (336) 271-4999   °[facility]    (336) 373-0678   °[facility]    (336) 272-1050   °[facility] ° [address], [place] Phone:  (336) 832-4444, Fax:  (336) 832-4440 Hours of Operation:  9 am - 6 pm, M-F.  Also accepts Medicaid/Medicare and self-pay.  °[facility] ° [address], [place] Phone: (336) 832-3150, Fax: (336) [phone]. Hours of Operation:  8:30 am - 5:30 pm, M-F.  Also accepts Medicaid and self-pay.  °[facility] [place] [address], [place] Phone: (336) 878-6027   °[facility] [address], [place], [place] (336)723-1848, Ext. 123 Mondays & Thursdays: 7-9 AM.  First 15 patients are seen on a first come, first serve basis. °  ° °Medicaid-accepting [place] Providers: ° °Organization         Address  Phone   Notes  °[facility] [address], [place] (336) 641-2100 Also accepts self-pay patients.  °[facility] [address], [place] ° (336) 856-9996   °[facility] [address], [place]  (336) 288-8857   °[facility] [address] [place] [address], [place] (336) 299-7000   °Veita Bland [address], [place]  ° (336) 373-1557 Only accepts [place] Access Medicaid patients after they have their name applied to their card.  ° °Self-Pay (no insurance) in [place]: ° °Organization         Address  Phone   Notes  °Sickle Cell Patients, [facility] [address], [place] (336) 832-1970   °[facility] [address], [place] (336) 832-4400   °[facility] ° [address], [place] (336) 992-4800   °[facility]/Dr. Osei-Bonsu ° [address] [place] [address], [place] or [address], [place] (336) 841-8500 Phone number for both [place] and [place] locations is the same.  °[facility] [address], [place] (336) 299-0000   °[facility] [address] [place] [address], [place] or [address] (336) 852-7530 °(336) 878-2260   °[facility] [address], [place] (336) 350-1642, phone; (336) 294-5005, fax Sees patients 1st and 3rd Saturday of every month.  Must not qualify for public or private insurance (i.e. Medicaid, Medicare, [place] Health Choice, Veterans' Benefits) • Household income should be no more than 200% of the poverty level •The clinic cannot treat you if you are pregnant or think you are pregnant • Sexually transmitted diseases are not treated at the clinic.  ° ° °Dental Care: °Organization         Address  Phone  Notes  °[place] [facility] [address], [place] (336) 641-6152 Accepts children up to age 21 who are enrolled in Medicaid or [place] Health Choice; pregnant women with a Medicaid card; and children who have applied for Medicaid or [place] Health Choice, but were declined, whose parents can pay a reduced fee at time of service.  °[place]  [facility] [place]  [address], [place] (336) 641-7733 Accepts children up to age 21 who are enrolled in Medicaid or [place] Health Choice; pregnant women with a Medicaid card; and children who have applied for Medicaid or [place] Health Choice, but were declined, whose parents can pay a reduced fee at time of service.  °[facility] ° [address], [place] (336) 641-4533 Patients are seen by appointment only. Walk-ins are not accepted. [facility] will see patients 18 years of age and older. °Monday - Tuesday (8am-5pm) °Most Wednesdays (8:30-5pm) °$30 per visit, cash only  °[facility] ° [address], [place] (336) 641-4533 Patients are seen by appointment only. Walk-ins are not accepted. [facility] will see patients 18 years of age and older. °One   Wednesday Evening (Monthly: Volunteer Based).  $30 per visit, cash only  °[facility]  (919) 537-3737 for adults; Children under age 4, call Graduate Pediatric Dentistry at ([phone]) [phone]. Children aged 4-14, please call (919) [phone] to request a pediatric application. ° Dental services are provided in all areas of dental care including fillings, crowns and bridges, complete and partial dentures, implants, gum treatment, root canals, and extractions. Preventive care is also provided. Treatment is provided to both adults and children. °Patients are selected via a lottery and there is often a waiting list. °  °[facility] [address], °[place] ° (336) 763-8833 [email or web address] °  °[facility] [address], [place], [place] (336)723-1848, Ext. 123 Second and Fourth Thursday of each month, opens at 6:30 AM; Clinic ends at 9 AM.  Patients are seen on a first-come first-served basis, and a limited number are seen during each clinic.  ° °[facility] ° [address], [place], [place] (336) 723-7904    Eligibility Requirements °You must have lived in [place], [place], or [place] counties for at least the last three months. °  You cannot be eligible for state or federal sponsored healthcare insurance, including Veterans Administration, Medicaid, or Medicare. °  You generally cannot be eligible for healthcare insurance through your employer.  °  How to apply: °Eligibility screenings are held every Tuesday and Wednesday afternoon from 1:00 pm until 4:00 pm. You do not need an appointment for the interview!  °[facility] [address], [place], [place] 336-631-2330   °[facility]  336-342-8273   °[facility]  336-703-3100   °[facility]  336-570-6415   ° °Behavioral Health Resources in the Community: °Intensive Outpatient Programs °Organization         Address  Phone  Notes  °[facility] [address], [place], [place] 336-878-6098   °[facility] [address], [place], [place] 336-832-9800   °[facility] [address], [place], [place] ° 336-882-2125   °[facility] [address],  °[place], [place] 1-800-853-5163 or 336-641-4981   °Substance Abuse Resources °Organization         Address  Phone  Notes  °[facility]  336-882-2125   °[facility]  336-784-9470   °[facility]  336-285-9073   °[facility]  336-845-3988   °Residential & Outpatient Substance Abuse Program  1-800-659-3381   °Psychological Services °Organization         Address  Phone  Notes  °[facility]  336- 832-9600   °[facility]  336- 378-7881   °[facility] [address], [place] 1-800-853-5163 or 336-641-4981   ° °Mobile Crisis Teams °Organization         Address  Phone  Notes  °[facility], Mobile Crisis Care Unit  1-877-626-1772   °Assertive °[facility] ° [address]  [place], [place] 336-834-9664   °Sharon DeEsch [address] °[place] 336-554-5454   ° °Self-Help/Support Groups °Organization         Address  Phone             Notes  °[facility] of  - variety of support groups  336- 373-1402 Call for more information  °Narcotics Anonymous (NA), [facility] [address], °[place]  2 meetings at this location  ° °  Residential Treatment Programs Organization         Address  Phone  Notes  [facility] [address],    [place]  [phone]) [phone]   [facility]  [address], [place] [phone], [place], [place] [phone]   [facility] [address], [place] [phone] Admissions: 8am-3pm M-F  [facility] [address]    [place], [place] [phone]([phone])[phone]   [facility] [address], [place], [place] [phone]   [facility] [address]  [place], [place] [phone]   [facility] Intensive Outpatient [address], [place], [place] [phone]   [facility] [address]  [place], [place] [phone] or [phone]   [facility] ([facility] [address] [place], [place] [phone] Accepts Medicaid  [facility] [address]  [place] [phone] Substance Abuse/Addiction Treatment   [facility] Behavioral [facility] Resources Organization         Address  Phone  Notes  [facility]  (272)815-4874(888) [phone]   Angie FavaJulie Brannon, PhD [address], [address] [place], [place]   ([phone](336) [phone] or ([phone](336) [phone]   [facility]   [address] [place], [place] [phone](336) [phone]   [facility] [address], [place], [place] [phone](336) [phone] Insurance/Medicaid/sponsorship through [facility] [address]                                    [place], [place] [phone](336) [phone] Therapy/tele-psych/case    [facility] [address]   [place], [place] [phone](336) [phone]    Dr. Lolly MustacheArfeen  [phone](336) [phone]   [facility]  [employer] [facility] 1) [address], [place] 2) [address], [place] 3)  [address], [place] [phone](336) [phone](336) [phone]  ([phone](336) [phone]   [facility] Child Abuse Hotline 914-610-9228(336) 623-867-4760 or (312) 528-8881(336) 8647150360 (After Hours)      Take the prescription as directed.  Use your albuterol inhaler (2 to 4 puffs) every 4 hours for the next 7 days, then as needed for cough, wheezing, or shortness of breath.  Take over the counter decongestant (such as sudafed), as well as antihistamine (such as claritin, zyrtec, or allegra), as directed on packaging, for the next week.  Use over the counter normal saline nasal spray, as instructed in the Emergency Department, several times per day for the next 2 weeks.  Call your regular medical doctor tomorrow to schedule a follow up appointment this week.  Return to the Emergency Department immediately if worsening.

## 2014-12-26 ENCOUNTER — Encounter: Payer: Self-pay | Admitting: Internal Medicine

## 2014-12-27 ENCOUNTER — Telehealth: Payer: Self-pay | Admitting: Internal Medicine

## 2014-12-27 NOTE — Telephone Encounter (Signed)
Call to patient to confirm appointment for 12/28/14 at 2:15 lmtcb

## 2014-12-28 ENCOUNTER — Encounter: Payer: Self-pay | Admitting: Internal Medicine

## 2015-01-11 ENCOUNTER — Telehealth: Payer: Self-pay | Admitting: Internal Medicine

## 2015-01-11 NOTE — Telephone Encounter (Signed)
Call to patient to confirm appointment for 01/15/15 at 10:45 lmtcb

## 2015-01-15 ENCOUNTER — Ambulatory Visit: Payer: Self-pay

## 2015-01-15 ENCOUNTER — Encounter: Payer: Self-pay | Admitting: Internal Medicine

## 2015-01-15 ENCOUNTER — Ambulatory Visit (INDEPENDENT_AMBULATORY_CARE_PROVIDER_SITE_OTHER): Payer: Self-pay | Admitting: Internal Medicine

## 2015-01-15 VITALS — BP 141/76 | HR 81 | Temp 98.1°F | Ht 67.0 in | Wt 272.8 lb

## 2015-01-15 DIAGNOSIS — J45909 Unspecified asthma, uncomplicated: Secondary | ICD-10-CM

## 2015-01-15 DIAGNOSIS — J301 Allergic rhinitis due to pollen: Secondary | ICD-10-CM

## 2015-01-15 DIAGNOSIS — J4520 Mild intermittent asthma, uncomplicated: Secondary | ICD-10-CM

## 2015-01-15 MED ORDER — LORATADINE 10 MG PO CAPS: 10.0000 mg | ORAL_CAPSULE | Freq: Every day | ORAL | Status: DC

## 2015-01-15 MED ORDER — LORATADINE 10 MG PO CAPS: 10.0000 mg | ORAL_CAPSULE | Freq: Every day | ORAL | Status: DC | PRN

## 2015-01-15 MED ORDER — FLUTICASONE PROPIONATE 50 MCG/ACT NA SUSP: 1.0000 | Freq: Every day | NASAL | Status: DC

## 2015-01-15 MED ORDER — LORATADINE 10 MG PO CAPS: 10.0000 mg | ORAL_CAPSULE | Freq: Two times a day (BID) | ORAL | Status: DC

## 2015-01-15 NOTE — Patient Instructions (Signed)
Please take claritin 10 mg daily as needed  Please take Flonase nasal spray  Please come back if symptoms persist Allergic Rhinitis Allergic rhinitis is when the mucous membranes in the nose respond to allergens. Allergens are particles in the air that cause your body to have an allergic reaction. This causes you to release allergic antibodies. Through a chain of events, these eventually cause you to release histamine into the blood stream. Although meant to protect the body, it is this release of histamine that causes your discomfort, such as frequent sneezing, congestion, and an itchy, runny nose.  CAUSES  Seasonal allergic rhinitis (hay fever) is caused by pollen allergens that may come from grasses, trees, and weeds. Year-round allergic rhinitis (perennial allergic rhinitis) is caused by allergens such as house dust mites, pet dander, and mold spores.  SYMPTOMS   Nasal stuffiness (congestion).  Itchy, runny nose with sneezing and tearing of the eyes. DIAGNOSIS  Your health care provider can help you determine the allergen or allergens that trigger your symptoms. If you and your health care provider are unable to determine the allergen, skin or blood testing may be used. TREATMENT  Allergic rhinitis does not have a cure, but it can be controlled by:  Medicines and allergy shots (immunotherapy).  Avoiding the allergen. Hay fever may often be treated with antihistamines in pill or nasal spray forms. Antihistamines block the effects of histamine. There are over-the-counter medicines that may help with nasal congestion and swelling around the eyes. Check with your health care provider before taking or giving this medicine.  If avoiding the allergen or the medicine prescribed do not work, there are many new medicines your health care provider can prescribe. Stronger medicine may be used if initial measures are ineffective. Desensitizing injections can be used if medicine and avoidance does not  work. Desensitization is when a patient is given ongoing shots until the body becomes less sensitive to the allergen. Make sure you follow up with your health care provider if problems continue. HOME CARE INSTRUCTIONS It is not possible to completely avoid allergens, but you can reduce your symptoms by taking steps to limit your exposure to them. It helps to know exactly what you are allergic to so that you can avoid your specific triggers. SEEK MEDICAL CARE IF:   You have a fever.  You develop a cough that does not stop easily (persistent).  You have shortness of breath.  You start wheezing.  Symptoms interfere with normal daily activities. Document Released: 06/10/2001 Document Revised: 09/20/2013 Document Reviewed: 05/23/2013 [facility] Patient Information 2015 [place] [employer] [place]. This information is not intended to replace advice given to you by your health care provider. Make sure you discuss any questions you have with your health care provider.

## 2015-01-15 NOTE — Progress Notes (Signed)
Patient ID: Lynn Bond, female   DOB: 03/08/1966, 49 y.o.   MRN: [id number]   Subjective:   HPI: Lynn Bond is a 49 y.o. 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 presents for an ED follow up.  Reason(s) for this visit: ED follow up for allergic rhinitis: Patient was evaluated in the ED on 12/24/2014 for allergic rhinitis and mild asthma exacerbation. According to the ED note, she had some wheezing on exam. She was treated with albuterol and sent home on a steroid burst which she has since completed. She states that her symptoms have only slightly improved with her main issue being congestion of her nose, itchy eyes, and rhinorrhea. She does not have wheezing. She actually states that she has never had an asthma attack in the last 15 years. She attributes her symptoms to seasonal allergies to pollen. She denies fevers, chills or any other constitutional symptoms.  ROS: Constitutional: Denies fever, chills, diaphoresis, appetite change and fatigue.  Respiratory: Denies SOB, DOE, cough, chest tightness, and wheezing. Denies chest pain. CVS: No chest pain, palpitations and leg swelling.  GI: No abdominal pain, nausea, vomiting, bloody stools GU: No dysuria, frequency, hematuria, or flank pain.  MSK: No myalgias, back pain, joint swelling, arthralgias  Psych: No depression symptoms. No SI or SA.    Objective:  Physical Exam: Filed Vitals:   01/15/15 1126  BP: 141/76  Pulse: 81  Temp: 98.1 F (36.7 C)  TempSrc: Oral  Height: 5\' 7"  (1.702 m)  Weight: 272 lb 12.8 oz (123.741 kg)  SpO2: 100%   General: Well nourished. No acute distress.  HEENT: Slightly enlarged nasal turbinates. Hypo-nasal phonation noted. Normal oral mucosa. MMM.  Lungs: CTA bilaterally. Heart: RRR; no extra sounds or murmurs  Abdomen: Non-distended, normal bowel sounds, soft, nontender; no hepatosplenomegaly  Extremities: No pedal  edema. No joint swelling or tenderness. Neurologic: Normal EOM,  Alert and oriented x3. No obvious neurologic/cranial nerve deficits.  Assessment & Plan:  Discussed case with my attending in the clinic, Dr. Heide SparkNarendra.  See problem based charting.

## 2015-01-15 NOTE — Assessment & Plan Note (Signed)
Her asthma symptoms seem to be well controlled with albuterol inhaler. She is not on inhaled corticosteroids and her last asthma attack was 15 years ago. I do not think that her current symptoms are related to asthma. I recommended she continue using albuterol inhaler for wheezing and shortness of breath. I'll focus on treating her allergic rhinitis.

## 2015-01-15 NOTE — Assessment & Plan Note (Signed)
Her symptoms seem to be associated with allergic rhinitis with mention of pollen allergy. Exam with slightly enlarged nasal turbinates bilaterally. Otherwise, the rest of physical exam is unremarkable Plan -Flonase nasal spray -Claritin 10 mg daily prn -Recommended normal saline nasal drops as needed

## 2015-01-17 NOTE — Progress Notes (Signed)
INTERNAL MEDICINE TEACHING ATTENDING ADDENDUM - Lynn Kundert, MD: I reviewed and discussed at the time of visit with the resident Dr. Kazibwe, the patient's medical history, physical examination, diagnosis and results of pertinent tests and treatment and I agree with the patient's care as documented.  

## 2015-01-17 NOTE — Addendum Note (Signed)
Addended by: Dow AdolphKAZIBWE, Xavier Munger on: 01/17/2015 10:08 AM   Modules accepted: Level of Service

## 2015-01-23 ENCOUNTER — Telehealth: Payer: Self-pay | Admitting: *Deleted

## 2015-01-23 NOTE — Telephone Encounter (Signed)
Call from pt stating she was seen on 01/15/15 for allergy-like symptoms.  Pt is now calling stating that her allergy symptoms has improved, but she is still coughing, wheezing a little bit (worse at night), and her chest feels a little tight, currently out of her albuterol inhaler and is calling to request a refill.  Pt states states the "wheezing" is the same since last visit-not worse, just not getting any better.  Just obtained the orange card and would like rx sent to health dept pharmacy.please advise.Kingsley SpittleGoldston, Darlene Cassady4/26/201611:59 AM

## 2015-01-25 MED ORDER — ALBUTEROL SULFATE HFA 108 (90 BASE) MCG/ACT IN AERS: 1.0000 | INHALATION_SPRAY | RESPIRATORY_TRACT | Status: DC | PRN

## 2015-01-25 NOTE — Telephone Encounter (Signed)
Dr E on vacation. I refilled albuterol. Set to fax - would you make certain it was received?

## 2015-02-12 ENCOUNTER — Encounter: Payer: Self-pay | Admitting: Internal Medicine

## 2015-02-12 ENCOUNTER — Ambulatory Visit (INDEPENDENT_AMBULATORY_CARE_PROVIDER_SITE_OTHER): Payer: Self-pay | Admitting: Internal Medicine

## 2015-02-12 VITALS — BP 114/58 | HR 72 | Temp 98.6°F | Ht 67.0 in | Wt 280.1 lb

## 2015-02-12 DIAGNOSIS — J45909 Unspecified asthma, uncomplicated: Secondary | ICD-10-CM

## 2015-02-12 DIAGNOSIS — J301 Allergic rhinitis due to pollen: Secondary | ICD-10-CM

## 2015-02-12 DIAGNOSIS — I10 Essential (primary) hypertension: Secondary | ICD-10-CM

## 2015-02-12 DIAGNOSIS — J309 Allergic rhinitis, unspecified: Secondary | ICD-10-CM

## 2015-02-12 DIAGNOSIS — L409 Psoriasis, unspecified: Secondary | ICD-10-CM

## 2015-02-12 MED ORDER — FLUTICASONE PROPIONATE 50 MCG/ACT NA SUSP: 1.0000 | Freq: Every day | NASAL | Status: DC

## 2015-02-12 MED ORDER — HYDROCHLOROTHIAZIDE 25 MG PO TABS: 25.0000 mg | ORAL_TABLET | Freq: Every day | ORAL | Status: DC

## 2015-02-12 MED ORDER — HYDROCORTISONE 2.5 % EX LOTN: TOPICAL_LOTION | CUTANEOUS | Status: DC

## 2015-02-12 MED ORDER — LORATADINE 10 MG PO CAPS: 10.0000 mg | ORAL_CAPSULE | Freq: Every day | ORAL | Status: DC | PRN

## 2015-02-12 MED ORDER — ALBUTEROL SULFATE HFA 108 (90 BASE) MCG/ACT IN AERS: 1.0000 | INHALATION_SPRAY | RESPIRATORY_TRACT | Status: DC | PRN
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
## 2015-02-12 NOTE — Patient Instructions (Signed)
Apply hydrocortisone 2.5% lotion to rash twice a day. AVOID applying lotion to areas of cracked skin.      Psoriasis Psoriasis is a common, long-lasting (chronic) inflammation of the skin. It affects both men and women equally, of all ages and all races. Psoriasis cannot be passed from person to person (not contagious). Psoriasis varies from mild to very severe. When severe, it can greatly affect your quality of life. Psoriasis is an inflammatory disorder affecting the skin as well as other organs including the joints (causing an arthritis). With psoriasis, the skin sheds its top layer of cells more rapidly than it does in someone without psoriasis. CAUSES  The cause of psoriasis is largely unknown. Genetics, your immune system, and the environment seem to play a role in causing psoriasis. Factors that can make psoriasis worse include:  Damage or trauma to the skin, such as cuts, scrapes, and sunburn. This damage often causes new areas of psoriasis (lesions).  Winter dryness and lack of sunlight.  Medicines such as lithium, beta-blockers, antimalarial drugs, ACE inhibitors, nonsteroidal anti-inflammatory drugs (ibuprofen, aspirin), and terbinafine. Let your caregiver know if you are taking any of these drugs.  Alcohol. Excessive alcohol use should be avoided if you have psoriasis. Drinking large amounts of alcohol can affect:  How well your psoriasis treatment works.  How safe your psoriasis treatment is.  Smoking. If you smoke, ask your caregiver for help to quit.  Stress.  Bacterial or viral infections.  Arthritis. Arthritis associated with psoriasis (psoriatic arthritis) affects less than 10% of patients with psoriasis. The arthritic intensity does not always match the skin psoriasis intensity. It is important to let your caregiver know if your joints hurt or if they are stiff. SYMPTOMS  The most common form of psoriasis begins with little red bumps that gradually become larger. The  bumps begin to form scales that flake off easily. The lower layers of scales stick together. When these scales are scratched or removed, the underlying skin is tender and bleeds easily. These areas then grow in size and may become large. Psoriasis often creates a rash that looks the same on both sides of the body (symmetrical). It often affects the elbows, knees, groin, genitals, arms, legs, scalp, and nails. Affected nails often have pitting, loosen, thicken, crumble, and are difficult to treat.  "Inverse psoriasis"occurs in the armpits, under breasts, in skin folds, and around the groin, buttocks, and genitals.  "Guttate psoriasis" generally occurs in children and young adults following a recent sore throat (strep throat). It begins with many small, red, scaly spots on the skin. It clears spontaneously in weeks or a few months without treatment. DIAGNOSIS  Psoriasis is diagnosed by physical exam. A tissue sample (biopsy) may also be taken. TREATMENT The treatment of psoriasis depends on your age, health, and living conditions.  Steroid (cortisone) creams, lotions, and ointments may be used. These treatments are associated with thinning of the skin, blood vessels that get larger (dilated), loss of skin pigmentation, and easy bruising. It is important to use these steroids as directed by your caregiver. Only treat the affected areas and not the normal, unaffected skin. People on long-term steroid treatment should wear a medical alert bracelet. Injections may be used in areas that are difficult to treat.  Scalp treatments are available as shampoos, solutions, sprays, foams, and oils. Avoid scratching the scalp and picking at the scales.  Anthralin medicine works well on areas that are difficult to treat. However, it stains clothes and skin  and may cause temporary irritation.  Synthetic vitamin D (calcipotriene)can be used on small areas. It is available by prescription. The forms of synthetic  vitamin D available in health food stores do not help with psoriasis.  Coal tarsare available in various strengths for psoriasis that is difficult to treat. They are one of the longest used treatments for difficult to treat psoriasis. However, they are messy to use.  Light therapy (UV therapy) can be carefully and professionally monitored in a dermatologist's office. Careful sunbathing is helpful for many people as directed by your caregiver. The exposure should be just long enough to cause a mild redness (erythema) of your skin. Avoid sunburn as this may make the condition worse. Sunscreen (SPF of 30 or higher) should be used to protect against sunburn. Cataracts, wrinkles, and skin aging are some of the harmful side effects of light therapy.  If creams (topical medicines) fail, there are several other options for systemic or oral medicines your caregiver can suggest. Psoriasis can sometimes be very difficult to treat. It can come and go. It is necessary to follow up with your caregiver regularly if your psoriasis is difficult to treat. Usually, with persistence you can get a good amount of relief. Maintaining consistent care is important. Do not change caregivers just because you do not see immediate results. It may take several trials to find the right combination of treatment for you. PREVENTING FLARE-UPS  Wear gloves while you wash dishes, while cleaning, and when you are outside in the cold.  If you have radiators, place a bowl of water or damp towel on the radiator. This will help put water back in the air. You can also use a humidifier to keep the air moist. Try to keep the humidity at about 60% in your home.  Apply moisturizer while your skin is still damp from bathing or showering. This traps water in the skin.  Avoid long, hot baths or showers. Keep soap use to a minimum. Soaps dry out the skin and wash away the protective oils. Use a fragrance free, dye free soap.  Drink enough water  and fluids to keep your urine clear or pale yellow. Not drinking enough water depletes your skin's water supply.  Turn off the heat at night and keep it low during the day. Cool air is less drying. SEEK MEDICAL CARE IF:  You have increasing pain in the affected areas.  You have uncontrolled bleeding in the affected areas.  You have increasing redness or warmth in the affected areas.  You start to have pain or stiffness in your joints.  You start feeling depressed about your condition.  You have a fever. Document Released: 09/12/2000 Document Revised: 12/08/2011 Document Reviewed: 03/10/2011 [facility] Patient Information 2015 [place], [place]. This information is not intended to replace advice given to you by your health care provider. Make sure you discuss any questions you have with your health care provider.

## 2015-02-13 DIAGNOSIS — L309 Dermatitis, unspecified: Secondary | ICD-10-CM | POA: Insufficient documentation

## 2015-02-13 HISTORY — DX: Dermatitis, unspecified: L30.9

## 2015-02-13 NOTE — Assessment & Plan Note (Signed)
Refilled albuterol inhaler, no complaints regarding asthma today.

## 2015-02-13 NOTE — Assessment & Plan Note (Signed)
Refilled loratidine and flonase, sx well controlled.

## 2015-02-13 NOTE — Assessment & Plan Note (Signed)
Pt has silver, scaly, itchy plaques on her MCP joints on left hand with dry cracked skin on palmer surfaces of both hands. Will tx for psoriasis.   - rx for hydrocortisone 2.5% lotion applied topically BID. Instructed to avoid areas were there are skin breaks.

## 2015-02-13 NOTE — Assessment & Plan Note (Signed)
BP Readings from Last 3 Encounters:  02/12/15 114/58  01/15/15 141/76  12/24/14 126/74    Lab Results  Component Value Date   NA 136 04/25/2014   K 3.7 04/25/2014   CREATININE 0.81 04/25/2014    Assessment: Blood pressure control:  controlled Progress toward BP goal:   at goal Comments: on HCTZ 25mg   Plan: Medications:  continue current medications Educational resources provided: brochure Self management tools provided:  (denies) Other plans: refilled HCTZ

## 2015-02-14 NOTE — Progress Notes (Signed)
[facility] Clinic Attending  Case discussed with Dr. Truong at the time of the visit.  We reviewed the resident's history and exam and pertinent patient test results.  I agree with the assessment, diagnosis, and plan of care documented in the resident's note.  

## 2015-07-13 ENCOUNTER — Ambulatory Visit: Payer: Self-pay

## 2015-07-20 ENCOUNTER — Ambulatory Visit: Payer: Self-pay

## 2015-12-07 ENCOUNTER — Other Ambulatory Visit: Payer: Self-pay

## 2015-12-07 DIAGNOSIS — Z1231 Encounter for screening mammogram for malignant neoplasm of breast: Secondary | ICD-10-CM

## 2015-12-13 ENCOUNTER — Ambulatory Visit (INDEPENDENT_AMBULATORY_CARE_PROVIDER_SITE_OTHER): Payer: PRIVATE HEALTH INSURANCE | Admitting: Internal Medicine

## 2015-12-13 ENCOUNTER — Encounter: Payer: Self-pay | Admitting: Internal Medicine

## 2015-12-13 VITALS — BP 127/71 | HR 73 | Temp 98.3°F | Ht 67.0 in | Wt 302.1 lb

## 2015-12-13 DIAGNOSIS — I10 Essential (primary) hypertension: Secondary | ICD-10-CM

## 2015-12-13 DIAGNOSIS — M25562 Pain in left knee: Secondary | ICD-10-CM

## 2015-12-13 DIAGNOSIS — J4530 Mild persistent asthma, uncomplicated: Secondary | ICD-10-CM

## 2015-12-13 DIAGNOSIS — J45909 Unspecified asthma, uncomplicated: Secondary | ICD-10-CM

## 2015-12-13 DIAGNOSIS — Z79899 Other long term (current) drug therapy: Secondary | ICD-10-CM

## 2015-12-13 DIAGNOSIS — E669 Obesity, unspecified: Secondary | ICD-10-CM | POA: Diagnosis not present

## 2015-12-13 DIAGNOSIS — Z9681 Presence of artificial skin: Secondary | ICD-10-CM | POA: Diagnosis not present

## 2015-12-13 DIAGNOSIS — Z7951 Long term (current) use of inhaled steroids: Secondary | ICD-10-CM

## 2015-12-13 DIAGNOSIS — Z6841 Body Mass Index (BMI) 40.0 and over, adult: Secondary | ICD-10-CM

## 2015-12-13 DIAGNOSIS — L309 Dermatitis, unspecified: Secondary | ICD-10-CM

## 2015-12-13 DIAGNOSIS — M25561 Pain in right knee: Secondary | ICD-10-CM

## 2015-12-13 LAB — POCT GLYCOSYLATED HEMOGLOBIN (HGB A1C): HEMOGLOBIN A1C: 5.6

## 2015-12-13 LAB — GLUCOSE, CAPILLARY: Glucose-Capillary: 83 mg/dL (ref 65–99)

## 2015-12-13 MED ORDER — BECLOMETHASONE DIPROPIONATE 80 MCG/ACT IN AERS: 1.0000 | INHALATION_SPRAY | Freq: Every day | RESPIRATORY_TRACT | Status: DC

## 2015-12-13 MED ORDER — HYDROCORTISONE 2.5 % EX LOTN: TOPICAL_LOTION | CUTANEOUS | Status: DC

## 2015-12-13 MED ORDER — HYDROCHLOROTHIAZIDE 25 MG PO TABS: 25.0000 mg | ORAL_TABLET | Freq: Every day | ORAL | Status: DC

## 2015-12-13 MED ORDER — ALBUTEROL SULFATE HFA 108 (90 BASE) MCG/ACT IN AERS: 1.0000 | INHALATION_SPRAY | RESPIRATORY_TRACT | Status: DC | PRN

## 2015-12-13 NOTE — Assessment & Plan Note (Signed)
Discussed weight loss by physical activity and diet. Gave her knee exercises. I think she is having overall fatigue and b/l knee pain from recent weight gain and physical inactivity.

## 2015-12-13 NOTE — Patient Instructions (Signed)
Start taking QVAR inhaler daily to help with asthma.  Use vaseline for eczema, use hydrocortisone cream only as needed.  Keep taking your BP medication.  Keep losing weight.

## 2015-12-13 NOTE — Progress Notes (Signed)
   Subjective:    Patient ID: Lynn Bond, female    DOB: 1966/09/13, 50 y.o.   MRN: [id number]  HPI  50 yo female with hx of HTN, asthma, eczema, GERD, tarsal tunnel syndrome on right, obesity, here for f/up of HTN, asthma, and also medication refill.  HTN: doing well on hctz 25mg  daily. Complaint with meds.  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   Eczema - well controlled, mainly on her right hand. Using her own home made lotion for moisturizing. Requesting hydrocortisone lotion refill.  Obesity - has been gaining weight. Not physically active. Not watching her food intake. Having some knee pain b/l which may be related to this.     Review of Systems  Constitutional: Positive for fatigue. Negative for fever and chills.  HENT: Negative for congestion, sneezing and sore throat.   Eyes: Negative for photophobia and visual disturbance.  Respiratory: Negative for cough and shortness of breath.   Cardiovascular: Negative for chest pain, palpitations and leg swelling.  Gastrointestinal: Negative for nausea, vomiting, diarrhea and abdominal distention.  Endocrine: Negative.   Genitourinary: Negative for dysuria and flank pain.  Musculoskeletal: Positive for arthralgias. Negative for back pain.  Skin: Negative.   Neurological: Negative for seizures, numbness and headaches.  Hematological: Negative.        Objective:   Physical Exam  Constitutional: She is oriented to person, place, and time. She appears well-developed. No distress.  Obese female.   HENT:  Head: Normocephalic and atraumatic.  Eyes: EOM are normal. Pupils are equal, round, and reactive to light.  Neck: Normal range of motion. No JVD present.  Cardiovascular: Normal rate and regular rhythm.  Exam reveals no  gallop and no friction rub.   No murmur heard. Pulmonary/Chest: Effort normal and breath sounds normal. No respiratory distress. She has no wheezes.  Abdominal: Soft. Bowel sounds are normal. She exhibits no distension. There is no tenderness.  Genitourinary: No breast swelling, tenderness, discharge or bleeding.  Normal breast exam.  Musculoskeletal: Normal range of motion. She exhibits no edema or tenderness.  Neurological: She is alert and oriented to person, place, and time.  Skin: She is not diaphoretic.    Filed Vitals:   12/13/15 1110  BP: 127/71  Pulse: 73  Temp: 98.3 F (36.8 C)         Assessment & Plan:  See problem based a&p.

## 2015-12-13 NOTE — Assessment & Plan Note (Signed)
Filed Vitals:   12/13/15 1110  BP: 127/71  Pulse: 73  Temp: 98.3 F (36.8 C)    BP well controlled on HCTZ 25mg  daily. Cont this. Will check her for DM II (hgba1c) and also lipid panel with her age, obesity, and HTN for screening purpose.

## 2015-12-13 NOTE — Assessment & Plan Note (Signed)
Asthma was well controlled in the past, did not need albuterol for more than 7 months. Now having daily symptoms 2/2 to cold exposure at workplace. She is not planning to change her job. Using albuterol 2x daily.  -will start Qvar 80 daily for maintenance - will do PRN albuterol, hopefully she does not need it as frequently.
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
## 2015-12-14 LAB — LIPID PANEL
CHOLESTEROL TOTAL: 131 mg/dL (ref 100–199)
Chol/HDL Ratio: 3.3 ratio units (ref 0.0–4.4)
HDL: 40 mg/dL (ref >39)
LDL CALC: 81 mg/dL (ref 0–99)
Triglycerides: 51 mg/dL (ref 0–149)
VLDL CHOLESTEROL CAL: 10 mg/dL (ref 5–40)

## 2015-12-17 NOTE — Progress Notes (Signed)
Internal Medicine Clinic Attending  Case discussed with Dr. Ahmed at the time of the visit.  We reviewed the resident's history and exam and pertinent patient test results.  I agree with the assessment, diagnosis, and plan of care documented in the resident's note. 

## 2016-01-16 ENCOUNTER — Encounter (HOSPITAL_COMMUNITY): Payer: Self-pay | Admitting: *Deleted

## 2016-01-16 ENCOUNTER — Emergency Department (HOSPITAL_COMMUNITY)
Admission: EM | Admit: 2016-01-16 | Discharge: 2016-01-17 | Disposition: A | Discharge status: Home / Self Care | Payer: PRIVATE HEALTH INSURANCE | Attending: Emergency Medicine | Admitting: Emergency Medicine

## 2016-01-16 ENCOUNTER — Emergency Department (HOSPITAL_COMMUNITY): Payer: PRIVATE HEALTH INSURANCE

## 2016-01-16 DIAGNOSIS — Z79899 Other long term (current) drug therapy: Secondary | ICD-10-CM | POA: Insufficient documentation

## 2016-01-16 DIAGNOSIS — Z7951 Long term (current) use of inhaled steroids: Secondary | ICD-10-CM | POA: Diagnosis not present

## 2016-01-16 DIAGNOSIS — J45909 Unspecified asthma, uncomplicated: Secondary | ICD-10-CM | POA: Diagnosis present

## 2016-01-16 DIAGNOSIS — Z8719 Personal history of other diseases of the digestive system: Secondary | ICD-10-CM | POA: Insufficient documentation

## 2016-01-16 DIAGNOSIS — I10 Essential (primary) hypertension: Secondary | ICD-10-CM | POA: Insufficient documentation

## 2016-01-16 DIAGNOSIS — Z87891 Personal history of nicotine dependence: Secondary | ICD-10-CM | POA: Diagnosis not present

## 2016-01-16 DIAGNOSIS — J45901 Unspecified asthma with (acute) exacerbation: Secondary | ICD-10-CM | POA: Diagnosis not present

## 2016-01-16 DIAGNOSIS — Z872 Personal history of diseases of the skin and subcutaneous tissue: Secondary | ICD-10-CM | POA: Diagnosis not present

## 2016-01-16 MED ORDER — PREDNISONE 20 MG PO TABS
60.0000 mg | ORAL_TABLET | Freq: Once | ORAL | Status: AC
  Administered 2016-01-17: 60 mg via ORAL
  Filled 2016-01-16: qty 3

## 2016-01-16 MED ORDER — IPRATROPIUM-ALBUTEROL 0.5-2.5 (3) MG/3ML IN SOLN
3.0000 mL | Freq: Once | RESPIRATORY_TRACT | Status: AC
  Administered 2016-01-17: 3 mL via RESPIRATORY_TRACT
  Filled 2016-01-16: qty 3

## 2016-01-16 NOTE — ED Notes (Signed)
The pt is c/o diff breathing for 3-4 days hx of asthma  She has had her inhaler and hhn without  Much  Relief.  No wheezes at present  She gets sob with exertion   Productive cough
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
## 2016-01-16 NOTE — ED Notes (Signed)
lmp April 1st

## 2016-01-17 MED ORDER — IPRATROPIUM-ALBUTEROL 0.5-2.5 (3) MG/3ML IN SOLN
RESPIRATORY_TRACT | Status: AC
  Filled 2016-01-17: qty 3

## 2016-01-17 MED ORDER — PREDNISONE 20 MG PO TABS: 60.0000 mg | ORAL_TABLET | Freq: Every day | ORAL | Status: DC

## 2016-01-17 NOTE — Discharge Instructions (Signed)
Asthma, Adult °Asthma is a recurring condition in which the airways tighten and narrow. Asthma can make it difficult to breathe. It can cause coughing, wheezing, and shortness of breath. Asthma episodes, also called asthma attacks, range from minor to life-threatening. Asthma cannot be cured, but medicines and lifestyle changes can help control it. °CAUSES °Asthma is believed to be caused by inherited (genetic) and environmental factors, but its exact cause is unknown. Asthma may be triggered by allergens, lung infections, or irritants in the air. Asthma triggers are different for each person. Common triggers include:  °· Animal dander. °· Dust mites. °· Cockroaches. °· Pollen from trees or grass. °· Mold. °· Smoke. °· Air pollutants such as dust, household cleaners, hair sprays, aerosol sprays, paint fumes, strong chemicals, or strong odors. °· Cold air, weather changes, and winds (which increase molds and pollens in the air). °· Strong emotional expressions such as crying or laughing hard. °· Stress. °· Certain medicines (such as aspirin) or types of drugs (such as beta-blockers). °· Sulfites in foods and drinks. Foods and drinks that may contain sulfites include dried fruit, potato chips, and sparkling grape juice. °· Infections or inflammatory conditions such as the flu, a cold, or an inflammation of the nasal membranes (rhinitis). °· Gastroesophageal reflux disease (GERD). °· Exercise or strenuous activity. °SYMPTOMS °Symptoms may occur immediately after asthma is triggered or many hours later. Symptoms include: °· Wheezing. °· Excessive nighttime or early morning coughing. °· Frequent or severe coughing with a common cold. °· Chest tightness. °· Shortness of breath. °DIAGNOSIS  °The diagnosis of asthma is made by a review of your medical history and a physical exam. Tests may also be performed. These may include: °· Lung function studies. These tests show how much air you breathe in and out. °· Allergy  tests. °· Imaging tests such as X-rays. °TREATMENT  °Asthma cannot be cured, but it can usually be controlled. Treatment involves identifying and avoiding your asthma triggers. It also involves medicines. There are 2 classes of medicine used for asthma treatment:  °· Controller medicines. These prevent asthma symptoms from occurring. They are usually taken every day. °· Reliever or rescue medicines. These quickly relieve asthma symptoms. They are used as needed and provide short-term relief. °Your health care provider will help you create an asthma action plan. An asthma action plan is a written plan for managing and treating your asthma attacks. It includes a list of your asthma triggers and how they may be avoided. It also includes information on when medicines should be taken and when their dosage should be changed. An action plan may also involve the use of a device called a peak flow meter. A peak flow meter measures how well the lungs are working. It helps you monitor your condition. °HOME CARE INSTRUCTIONS  °· Take medicines only as directed by your health care provider. Speak with your health care provider if you have questions about how or when to take the medicines. °· Use a peak flow meter as directed by your health care provider. Record and keep track of readings. °· Understand and use the action plan to help minimize or stop an asthma attack without needing to seek medical care. °· Control your home environment in the following ways to help prevent asthma attacks: °¨ Do not smoke. Avoid being exposed to secondhand smoke. °¨ Change your heating and air conditioning filter regularly. °¨ Limit your use of fireplaces and wood stoves. °¨ Get rid of pests (such as roaches   and mice) and their droppings. °¨ Throw away plants if you see mold on them. °¨ Clean your floors and dust regularly. Use unscented cleaning products. °¨ Try to have someone else vacuum for you regularly. Stay out of rooms while they are  being vacuumed and for a short while afterward. If you vacuum, use a dust mask from a hardware store, a double-layered or microfilter vacuum cleaner bag, or a vacuum cleaner with a HEPA filter. °¨ Replace carpet with wood, tile, or vinyl flooring. Carpet can trap dander and dust. °¨ Use allergy-proof pillows, mattress covers, and box spring covers. °¨ Wash bed sheets and blankets every week in hot water and dry them in a dryer. °¨ Use blankets that are made of polyester or cotton. °¨ Clean bathrooms and kitchens with bleach. If possible, have someone repaint the walls in these rooms with mold-resistant paint. Keep out of the rooms that are being cleaned and painted. °¨ Wash hands frequently. °SEEK MEDICAL CARE IF:  °· You have wheezing, shortness of breath, or a cough even if taking medicine to prevent attacks. °· The colored mucus you cough up (sputum) is thicker than usual. °· Your sputum changes from clear or white to yellow, green, gray, or bloody. °· You have any problems that may be related to the medicines you are taking (such as a rash, itching, swelling, or trouble breathing). °· You are using a reliever medicine more than 2-3 times per week. °· Your peak flow is still at 50-79% of your personal best after following your action plan for 1 hour. °· You have a fever. °SEEK IMMEDIATE MEDICAL CARE IF:  °· You seem to be getting worse and are unresponsive to treatment during an asthma attack. °· You are short of breath even at rest. °· You get short of breath when doing very little physical activity. °· You have difficulty eating, drinking, or talking due to asthma symptoms. °· You develop chest pain. °· You develop a fast heartbeat. °· You have a bluish color to your lips or fingernails. °· You are light-headed, dizzy, or faint. °· Your peak flow is less than 50% of your personal best. °  °This information is not intended to replace advice given to you by your health care provider. Make sure you discuss any  questions you have with your health care provider. °  °Document Released: 09/15/2005 Document Revised: 06/06/2015 Document Reviewed: 04/14/2013 °Elsevier Interactive Patient Education ©2016 [employer] ° °Prednisone tablets °What is this medicine? °PREDNISONE (PRED ni sone) is a corticosteroid. It is commonly used to treat inflammation of the skin, joints, lungs, and other organs. Common conditions treated include asthma, allergies, and arthritis. It is also used for other conditions, such as blood disorders and diseases of the adrenal glands. °This medicine may be used for other purposes; ask your health care provider or pharmacist if you have questions. °What should I tell my health care provider before I take this medicine? °They need to know if you have any of these conditions: °-Cushing's syndrome °-diabetes °-glaucoma °-heart disease °-high blood pressure °-infection (especially a virus infection such as chickenpox, cold sores, or herpes) °-kidney disease °-liver disease °-mental illness °-myasthenia gravis °-osteoporosis °-seizures °-stomach or intestine problems °-thyroid disease °-an unusual or allergic reaction to lactose, prednisone, other medicines, foods, dyes, or preservatives °-pregnant or trying to get pregnant °-breast-feeding °How should I use this medicine? °Take this medicine by mouth with a glass of water. Follow the directions on the prescription label. Take   this medicine with food. If you are taking this medicine once a day, take it in the morning. Do not take more medicine than you are told to take. Do not suddenly stop taking your medicine because you may develop a severe reaction. Your doctor will tell you how much medicine to take. If your doctor wants you to stop the medicine, the dose may be slowly lowered over time to avoid any side effects. °Talk to your pediatrician regarding the use of this medicine in children. Special care may be needed. °Overdosage: If you think you have taken  too much of this medicine contact a poison control center or emergency room at once. °NOTE: This medicine is only for you. Do not share this medicine with others. °What if I miss a dose? °If you miss a dose, take it as soon as you can. If it is almost time for your next dose, talk to your doctor or health care professional. You may need to miss a dose or take an extra dose. Do not take double or extra doses without advice. °What may interact with this medicine? °Do not take this medicine with any of the following medications: °-metyrapone °-mifepristone °This medicine may also interact with the following medications: °-aminoglutethimide °-amphotericin B °-aspirin and aspirin-like medicines °-barbiturates °-certain medicines for diabetes, like glipizide or glyburide °-cholestyramine °-cholinesterase inhibitors °-cyclosporine °-digoxin °-diuretics °-ephedrine °-female hormones, like estrogens and birth control pills °-isoniazid °-ketoconazole °-NSAIDS, medicines for pain and inflammation, like ibuprofen or naproxen °-phenytoin °-rifampin °-toxoids °-vaccines °-warfarin °This list may not describe all possible interactions. Give your health care provider a list of all the medicines, herbs, non-prescription drugs, or dietary supplements you use. Also tell them if you smoke, drink alcohol, or use illegal drugs. Some items may interact with your medicine. °What should I watch for while using this medicine? °Visit your doctor or health care professional for regular checks on your progress. If you are taking this medicine over a prolonged period, carry an identification card with your name and address, the type and dose of your medicine, and your doctor's name and address. °This medicine may increase your risk of getting an infection. Tell your doctor or health care professional if you are around anyone with measles or chickenpox, or if you develop sores or blisters that do not heal properly. °If you are going to have  surgery, tell your doctor or health care professional that you have taken this medicine within the last twelve months. °Ask your doctor or health care professional about your diet. You may need to lower the amount of salt you eat. °This medicine may affect blood sugar levels. If you have diabetes, check with your doctor or health care professional before you change your diet or the dose of your diabetic medicine. °What side effects may I notice from receiving this medicine? °Side effects that you should report to your doctor or health care professional as soon as possible: °-allergic reactions like skin rash, itching or hives, swelling of the face, lips, or tongue °-changes in emotions or moods °-changes in vision °-depressed mood °-eye pain °-fever or chills, cough, sore throat, pain or difficulty passing urine °-increased thirst °-swelling of ankles, feet °Side effects that usually do not require medical attention (report to your doctor or health care professional if they continue or are bothersome): °-confusion, excitement, restlessness °-headache °-nausea, vomiting °-skin problems, acne, thin and shiny skin °-trouble sleeping °-weight gain °This list may not describe all possible side effects. Call your doctor   for medical advice about side effects. You may report side effects to FDA at 1-800-FDA-1088. °Where should I keep my medicine? °Keep out of the reach of children. °Store at room temperature between 15 and 30 degrees C (59 and 86 degrees F). Protect from light. Keep container tightly closed. Throw away any unused medicine after the expiration date. °NOTE: This sheet is a summary. It may not cover all possible information. If you have questions about this medicine, talk to your doctor, pharmacist, or health care provider. °  °© 2016, Elsevier/[employer]. (2011-05-01 10:57:14) ° °

## 2016-03-04 ENCOUNTER — Encounter (HOSPITAL_COMMUNITY): Payer: Self-pay

## 2016-03-04 ENCOUNTER — Emergency Department (HOSPITAL_COMMUNITY)
Admission: EM | Admit: 2016-03-04 | Discharge: 2016-03-04 | Disposition: A | Discharge status: Home / Self Care | Payer: PRIVATE HEALTH INSURANCE | Attending: Emergency Medicine | Admitting: Emergency Medicine

## 2016-03-04 DIAGNOSIS — J45909 Unspecified asthma, uncomplicated: Secondary | ICD-10-CM | POA: Insufficient documentation

## 2016-03-04 DIAGNOSIS — Z8719 Personal history of other diseases of the digestive system: Secondary | ICD-10-CM | POA: Insufficient documentation

## 2016-03-04 DIAGNOSIS — Z7951 Long term (current) use of inhaled steroids: Secondary | ICD-10-CM | POA: Diagnosis not present

## 2016-03-04 DIAGNOSIS — Z87891 Personal history of nicotine dependence: Secondary | ICD-10-CM | POA: Diagnosis not present

## 2016-03-04 DIAGNOSIS — R202 Paresthesia of skin: Secondary | ICD-10-CM | POA: Insufficient documentation

## 2016-03-04 DIAGNOSIS — Z79899 Other long term (current) drug therapy: Secondary | ICD-10-CM | POA: Diagnosis not present

## 2016-03-04 DIAGNOSIS — I10 Essential (primary) hypertension: Secondary | ICD-10-CM | POA: Diagnosis not present

## 2016-03-04 DIAGNOSIS — M79641 Pain in right hand: Secondary | ICD-10-CM

## 2016-03-04 DIAGNOSIS — Z872 Personal history of diseases of the skin and subcutaneous tissue: Secondary | ICD-10-CM | POA: Insufficient documentation

## 2016-03-04 DIAGNOSIS — Z7952 Long term (current) use of systemic steroids: Secondary | ICD-10-CM | POA: Insufficient documentation
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
## 2016-03-04 NOTE — ED Notes (Signed)
Pt c/o right hand  "throbbing"  Since working " in a freezer, scooping food into a container". Cap refill slightly delayed. Hand warm to touch.

## 2016-03-04 NOTE — Discharge Instructions (Signed)
You have been seen today for hand pain. Your pain may be due to exposure to the cold. Take steps to make sure your hands and fingers stay as warm as possible. Warm them for at least 15 minutes every hour. Wear thick gloves. Use hand warmers, if possible. Should symptoms continue, it may indicate that you have some nerve abnormalities due to repetitive use or exposure to the cold. In this case, follow-up with the hand specialist as soon as possible. Use the number provided to call for an appointment. Follow up with PCP as needed. Return to ED should symptoms worsen. May use ibuprofen or naproxen for pain.

## 2016-03-04 NOTE — ED Notes (Signed)
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

## 2016-05-20 DIAGNOSIS — G5601 Carpal tunnel syndrome, right upper limb: Secondary | ICD-10-CM | POA: Insufficient documentation

## 2016-07-21 DIAGNOSIS — R202 Paresthesia of skin: Secondary | ICD-10-CM

## 2016-07-21 DIAGNOSIS — R200 Anesthesia of skin: Secondary | ICD-10-CM | POA: Insufficient documentation

## 2017-01-10 ENCOUNTER — Encounter (HOSPITAL_COMMUNITY): Payer: Self-pay | Admitting: *Deleted

## 2017-01-10 ENCOUNTER — Emergency Department (HOSPITAL_COMMUNITY)
Admission: EM | Admit: 2017-01-10 | Discharge: 2017-01-10 | Disposition: A | Discharge status: Home / Self Care | Payer: PRIVATE HEALTH INSURANCE | Attending: Emergency Medicine | Admitting: Emergency Medicine

## 2017-01-10 DIAGNOSIS — Z87891 Personal history of nicotine dependence: Secondary | ICD-10-CM | POA: Insufficient documentation

## 2017-01-10 DIAGNOSIS — Z91010 Allergy to peanuts: Secondary | ICD-10-CM | POA: Insufficient documentation

## 2017-01-10 DIAGNOSIS — J4520 Mild intermittent asthma, uncomplicated: Secondary | ICD-10-CM | POA: Insufficient documentation

## 2017-01-10 DIAGNOSIS — I10 Essential (primary) hypertension: Secondary | ICD-10-CM | POA: Insufficient documentation

## 2017-01-10 MED ORDER — ALBUTEROL SULFATE HFA 108 (90 BASE) MCG/ACT IN AERS
2.0000 | INHALATION_SPRAY | RESPIRATORY_TRACT | Status: DC
Start: 2017-01-10 — End: 2017-01-10
  Administered 2017-01-10: 2 via RESPIRATORY_TRACT
  Filled 2017-01-10: qty 6.7

## 2017-01-10 MED ORDER — PREDNISONE 10 MG PO TABS: 40.0000 mg | ORAL_TABLET | Freq: Every day | ORAL | 0 refills | Status: AC
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
## 2017-01-10 NOTE — ED Triage Notes (Addendum)
Pt reports that her allergies have been bothering her. Pt states that it has now caused her to start wheezing. Pt ambulatory and normal respiratory rate. Nonproductive cough. Expiratory wheezing with auscultation.

## 2017-01-12 ENCOUNTER — Ambulatory Visit (INDEPENDENT_AMBULATORY_CARE_PROVIDER_SITE_OTHER): Payer: Self-pay | Admitting: Internal Medicine

## 2017-01-12 ENCOUNTER — Encounter: Payer: Self-pay | Admitting: Internal Medicine

## 2017-01-12 VITALS — BP 135/76 | HR 80 | Temp 98.0°F | Ht 67.0 in | Wt 290.7 lb

## 2017-01-12 DIAGNOSIS — E669 Obesity, unspecified: Secondary | ICD-10-CM

## 2017-01-12 DIAGNOSIS — Z87891 Personal history of nicotine dependence: Secondary | ICD-10-CM

## 2017-01-12 DIAGNOSIS — J4540 Moderate persistent asthma, uncomplicated: Secondary | ICD-10-CM

## 2017-01-12 DIAGNOSIS — J301 Allergic rhinitis due to pollen: Secondary | ICD-10-CM

## 2017-01-12 DIAGNOSIS — L309 Dermatitis, unspecified: Secondary | ICD-10-CM

## 2017-01-12 DIAGNOSIS — J45909 Unspecified asthma, uncomplicated: Secondary | ICD-10-CM

## 2017-01-12 DIAGNOSIS — I10 Essential (primary) hypertension: Secondary | ICD-10-CM

## 2017-01-12 MED ORDER — HYDROCORTISONE 2.5 % EX LOTN: TOPICAL_LOTION | CUTANEOUS | 1 refills | Status: DC

## 2017-01-12 MED ORDER — BECLOMETHASONE DIPROPIONATE 80 MCG/ACT IN AERS: 1.0000 | INHALATION_SPRAY | Freq: Every day | RESPIRATORY_TRACT | 5 refills | Status: DC

## 2017-01-12 MED ORDER — FLUTICASONE PROPIONATE 50 MCG/ACT NA SUSP: 1.0000 | Freq: Every day | NASAL | 0 refills | Status: DC

## 2017-01-12 MED ORDER — ALBUTEROL SULFATE HFA 108 (90 BASE) MCG/ACT IN AERS: 1.0000 | INHALATION_SPRAY | RESPIRATORY_TRACT | 2 refills | Status: DC | PRN

## 2017-01-12 MED ORDER — HYDROCHLOROTHIAZIDE 25 MG PO TABS: 25.0000 mg | ORAL_TABLET | Freq: Every day | ORAL | 3 refills | Status: DC

## 2017-01-12 NOTE — Progress Notes (Signed)
Internal Medicine Clinic Attending  Case discussed with Dr. Rivet at the time of the visit.  We reviewed the resident's history and exam and pertinent patient test results.  I agree with the assessment, diagnosis, and plan of care documented in the resident's note.  

## 2017-01-12 NOTE — Assessment & Plan Note (Signed)
She had a mild exacerbation over the weekend and was evaluated in the ED. Symptoms likely provoked by seasonal allergies. Patient was given a sample albuterol inhaler from the clinic as she is unable to afford this medication. She is also supposed to be on Qvar but cannot afford this. She was given information about the MAP program as she already gets her medications from the health department. Advised to fill Qvar when able. Also recommended to her to not take prednisone at this time as she does not need it. She will follow up in 1 month with PCP.

## 2017-01-12 NOTE — Assessment & Plan Note (Signed)
BP acceptable. She has been off of her HCTZ for the last week. Will have her restart HCTZ 25 mg daily. Will get bmet today since has not had blood work in 3 years.

## 2017-01-12 NOTE — Assessment & Plan Note (Signed)
Will have her restart Zyrtec and Flonase. This is likely contributing to her uncontrolled asthma.

## 2017-01-12 NOTE — Patient Instructions (Addendum)
General Instructions: - Continue Zyrtec for your allergies. Start Flonase once daily. - Resume taking HCTZ 25 mg daily for blood pressure - Hydrocortisone lotion prescribed for eczema  - Call health department about MAP program  Please bring your medicines with you each time you come to clinic.  Medicines may include prescription medications, over-the-counter medications, herbal remedies, eye drops, vitamins, or other pills.   Progress Toward Treatment Goals:  No flowsheet data found.  Self Care Goals & Plans:  Self Care Goal 12/13/2015  Manage my medications take my medicines as prescribed; bring my medications to every visit; refill my medications on time  Eat healthy foods drink diet soda or water instead of juice or soda; eat more vegetables; eat foods that are low in salt; eat baked foods instead of fried foods; eat fruit for snacks and desserts  Be physically active -  Meeting treatment goals maintain the current self-care plan    No flowsheet data found.   Care Management & Community Referrals:  No flowsheet data found.

## 2017-01-12 NOTE — Assessment & Plan Note (Signed)
Given refill of hydrocortisone lotion as this has worked for her in the past.
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
## 2017-01-13 ENCOUNTER — Telehealth: Payer: Self-pay

## 2017-01-13 LAB — BMP8+ANION GAP
Anion Gap: 16 mmol/L (ref 10.0–18.0)
BUN/Creatinine Ratio: 20 (ref 9–23)
BUN: 17 mg/dL (ref 6–24)
CO2: 20 mmol/L (ref 18–29)
CREATININE: 0.83 mg/dL (ref 0.57–1.00)
Calcium: 8.6 mg/dL — ABNORMAL LOW (ref 8.7–10.2)
Chloride: 104 mmol/L (ref 96–106)
GFR calc Af Amer: 96 mL/min/{1.73_m2} (ref >59)
GFR calc non Af Amer: 83 mL/min/{1.73_m2} (ref >59)
Glucose: 129 mg/dL — ABNORMAL HIGH (ref 65–99)
Potassium: 3.5 mmol/L (ref 3.5–5.2)
SODIUM: 140 mmol/L (ref 134–144)

## 2017-01-13 NOTE — Telephone Encounter (Signed)
Need to speak with a nurse about meds. Please call back.  

## 2017-01-13 NOTE — Telephone Encounter (Signed)
rtc to patient left voicemail

## 2017-01-14 ENCOUNTER — Other Ambulatory Visit: Payer: Self-pay | Admitting: *Deleted

## 2017-01-14 DIAGNOSIS — J301 Allergic rhinitis due to pollen: Secondary | ICD-10-CM

## 2017-01-14 DIAGNOSIS — L309 Dermatitis, unspecified: Secondary | ICD-10-CM

## 2017-01-14 DIAGNOSIS — I10 Essential (primary) hypertension: Secondary | ICD-10-CM

## 2017-01-14 MED ORDER — FLUTICASONE PROPIONATE 50 MCG/ACT NA SUSP: 1.0000 | Freq: Every day | NASAL | 0 refills | Status: DC

## 2017-01-14 MED ORDER — HYDROCHLOROTHIAZIDE 25 MG PO TABS: 25.0000 mg | ORAL_TABLET | Freq: Every day | ORAL | 3 refills | Status: DC

## 2017-01-14 MED ORDER — HYDROCORTISONE 2.5 % EX LOTN: TOPICAL_LOTION | CUTANEOUS | 1 refills | Status: AC

## 2017-02-01 ENCOUNTER — Emergency Department (HOSPITAL_COMMUNITY): Payer: Self-pay

## 2017-02-01 ENCOUNTER — Emergency Department (HOSPITAL_COMMUNITY)
Admission: EM | Admit: 2017-02-01 | Discharge: 2017-02-01 | Disposition: A | Discharge status: Home / Self Care | Payer: Self-pay | Attending: Emergency Medicine | Admitting: Emergency Medicine

## 2017-02-01 DIAGNOSIS — J45909 Unspecified asthma, uncomplicated: Secondary | ICD-10-CM | POA: Insufficient documentation

## 2017-02-01 DIAGNOSIS — Z79899 Other long term (current) drug therapy: Secondary | ICD-10-CM | POA: Insufficient documentation

## 2017-02-01 DIAGNOSIS — R059 Cough, unspecified: Secondary | ICD-10-CM

## 2017-02-01 DIAGNOSIS — Z91010 Allergy to peanuts: Secondary | ICD-10-CM | POA: Insufficient documentation

## 2017-02-01 DIAGNOSIS — Z87891 Personal history of nicotine dependence: Secondary | ICD-10-CM | POA: Insufficient documentation

## 2017-02-01 DIAGNOSIS — I10 Essential (primary) hypertension: Secondary | ICD-10-CM | POA: Insufficient documentation

## 2017-02-01 DIAGNOSIS — R05 Cough: Secondary | ICD-10-CM | POA: Insufficient documentation

## 2017-02-01 MED ORDER — BENZONATATE 100 MG PO CAPS: 100.0000 mg | ORAL_CAPSULE | Freq: Three times a day (TID) | ORAL | 0 refills | Status: AC | PRN

## 2017-02-01 MED ORDER — HYDROCODONE-HOMATROPINE 5-1.5 MG/5ML PO SYRP: 5.0000 mL | ORAL_SOLUTION | Freq: Four times a day (QID) | ORAL | 0 refills | Status: AC | PRN

## 2017-02-01 MED ORDER — BENZONATATE 100 MG PO CAPS
200.0000 mg | ORAL_CAPSULE | Freq: Once | ORAL | Status: AC
  Administered 2017-02-01: 200 mg via ORAL
  Filled 2017-02-01: qty 2

## 2017-02-01 NOTE — ED Notes (Signed)
Patient left at this time with all belongings. 

## 2017-02-01 NOTE — ED Triage Notes (Signed)
The pt has had a cough for one week with yellow thick sputum  She has seen bl light blood in her sputum since monday

## 2017-02-01 NOTE — ED Triage Notes (Signed)
The coughing has made her throat irritated

## 2017-02-01 NOTE — Discharge Instructions (Signed)
Alternate Tessalon and cough syrup as needed for cough. Humidifier may be helpful as well. Continue to use his your albuterol inhaler or nebulizer as needed. Follow up with your primary care for reevaluation of your cough. Return to the ED if any concerning symptoms develop.
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
## 2017-04-19 ENCOUNTER — Emergency Department (HOSPITAL_COMMUNITY): Payer: Self-pay

## 2017-04-19 ENCOUNTER — Encounter (HOSPITAL_COMMUNITY): Payer: Self-pay | Admitting: Emergency Medicine

## 2017-04-19 ENCOUNTER — Emergency Department (HOSPITAL_COMMUNITY)
Admission: EM | Admit: 2017-04-19 | Discharge: 2017-04-19 | Disposition: A | Discharge status: Home / Self Care | Payer: Self-pay | Attending: Emergency Medicine | Admitting: Emergency Medicine

## 2017-04-19 DIAGNOSIS — S93402A Sprain of unspecified ligament of left ankle, initial encounter: Secondary | ICD-10-CM | POA: Insufficient documentation

## 2017-04-19 DIAGNOSIS — I10 Essential (primary) hypertension: Secondary | ICD-10-CM | POA: Insufficient documentation

## 2017-04-19 DIAGNOSIS — Z87891 Personal history of nicotine dependence: Secondary | ICD-10-CM | POA: Insufficient documentation

## 2017-04-19 DIAGNOSIS — J45909 Unspecified asthma, uncomplicated: Secondary | ICD-10-CM | POA: Insufficient documentation

## 2017-04-19 DIAGNOSIS — Y929 Unspecified place or not applicable: Secondary | ICD-10-CM | POA: Insufficient documentation

## 2017-04-19 DIAGNOSIS — W010XXA Fall on same level from slipping, tripping and stumbling without subsequent striking against object, initial encounter: Secondary | ICD-10-CM | POA: Insufficient documentation

## 2017-04-19 DIAGNOSIS — Y999 Unspecified external cause status: Secondary | ICD-10-CM | POA: Insufficient documentation

## 2017-04-19 DIAGNOSIS — Z79899 Other long term (current) drug therapy: Secondary | ICD-10-CM | POA: Insufficient documentation

## 2017-04-19 DIAGNOSIS — Z91010 Allergy to peanuts: Secondary | ICD-10-CM | POA: Insufficient documentation

## 2017-04-19 DIAGNOSIS — Y939 Activity, unspecified: Secondary | ICD-10-CM | POA: Insufficient documentation

## 2017-04-19 NOTE — ED Triage Notes (Signed)
Pt here with left ankle pain after twisting ankle 2 days ago

## 2017-04-19 NOTE — ED Notes (Signed)
Patient returned from xray.

## 2017-04-19 NOTE — ED Notes (Signed)
Patient currently in x ray.  Will be transported to room upon completion.

## 2017-04-19 NOTE — ED Notes (Signed)
See EDP secondary assessment.  

## 2017-04-19 NOTE — Discharge Instructions (Signed)
Ice - ice for 20 minutes at a time, several times a day Compression - wear brace to provide support Elevate - elevate ankle above level of heart Naproxen - take with food. Take up to 2 times daily
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
## 2017-06-21 ENCOUNTER — Emergency Department (HOSPITAL_COMMUNITY)
Admission: EM | Admit: 2017-06-21 | Discharge: 2017-06-21 | Disposition: A | Discharge status: Home / Self Care | Payer: Self-pay | Attending: Emergency Medicine | Admitting: Emergency Medicine

## 2017-06-21 ENCOUNTER — Encounter (HOSPITAL_COMMUNITY): Payer: Self-pay | Admitting: *Deleted

## 2017-06-21 DIAGNOSIS — Z87891 Personal history of nicotine dependence: Secondary | ICD-10-CM | POA: Insufficient documentation

## 2017-06-21 DIAGNOSIS — M25531 Pain in right wrist: Secondary | ICD-10-CM | POA: Insufficient documentation

## 2017-06-21 DIAGNOSIS — I10 Essential (primary) hypertension: Secondary | ICD-10-CM | POA: Insufficient documentation

## 2017-06-21 DIAGNOSIS — J45909 Unspecified asthma, uncomplicated: Secondary | ICD-10-CM | POA: Insufficient documentation

## 2017-06-21 DIAGNOSIS — Z91010 Allergy to peanuts: Secondary | ICD-10-CM | POA: Insufficient documentation

## 2017-06-21 DIAGNOSIS — R202 Paresthesia of skin: Secondary | ICD-10-CM | POA: Insufficient documentation

## 2017-06-21 DIAGNOSIS — Z79899 Other long term (current) drug therapy: Secondary | ICD-10-CM | POA: Insufficient documentation

## 2017-06-21 MED ORDER — ACETAMINOPHEN 500 MG PO TABS
1000.0000 mg | ORAL_TABLET | Freq: Once | ORAL | Status: AC
  Administered 2017-06-21: 1000 mg via ORAL
  Filled 2017-06-21: qty 2

## 2017-06-21 MED ORDER — IBUPROFEN 800 MG PO TABS
800.0000 mg | ORAL_TABLET | Freq: Once | ORAL | Status: AC
  Administered 2017-06-21: 800 mg via ORAL
  Filled 2017-06-21: qty 1

## 2017-06-21 MED ORDER — OXYCODONE HCL 5 MG PO TABS: 5.0000 mg | ORAL_TABLET | Freq: Once | ORAL | Status: DC
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
## 2017-06-21 NOTE — ED Triage Notes (Signed)
Pt is here with right hand surgery for carpal tunnel 9 months ago and now has developed numbness and tingling in first three fingers last week.

## 2017-06-21 NOTE — Discharge Instructions (Signed)
Wear the wrist splint as needed.  Mostly at night.  See your Hydrographic surveyor.  Take 4 over the counter ibuprofen tablets 3 times a day or 2 over-the-counter naproxen tablets twice a day for pain. Also take tylenol (2 extra strength) four times a day.

## 2017-08-19 ENCOUNTER — Ambulatory Visit: Payer: Self-pay

## 2017-11-25 ENCOUNTER — Encounter: Payer: Self-pay | Admitting: Internal Medicine

## 2017-12-07 ENCOUNTER — Emergency Department (HOSPITAL_COMMUNITY): Payer: Self-pay

## 2017-12-07 ENCOUNTER — Emergency Department (HOSPITAL_COMMUNITY)
Admission: EM | Admit: 2017-12-07 | Discharge: 2017-12-07 | Disposition: A | Discharge status: Home / Self Care | Payer: Self-pay | Attending: Emergency Medicine | Admitting: Emergency Medicine

## 2017-12-07 ENCOUNTER — Encounter (HOSPITAL_COMMUNITY): Payer: Self-pay

## 2017-12-07 ENCOUNTER — Other Ambulatory Visit: Payer: Self-pay

## 2017-12-07 DIAGNOSIS — M1712 Unilateral primary osteoarthritis, left knee: Secondary | ICD-10-CM | POA: Insufficient documentation

## 2017-12-07 DIAGNOSIS — M170 Bilateral primary osteoarthritis of knee: Secondary | ICD-10-CM

## 2017-12-07 DIAGNOSIS — Z87891 Personal history of nicotine dependence: Secondary | ICD-10-CM | POA: Insufficient documentation

## 2017-12-07 DIAGNOSIS — J45909 Unspecified asthma, uncomplicated: Secondary | ICD-10-CM | POA: Insufficient documentation

## 2017-12-07 DIAGNOSIS — I10 Essential (primary) hypertension: Secondary | ICD-10-CM | POA: Insufficient documentation

## 2017-12-07 DIAGNOSIS — Z79899 Other long term (current) drug therapy: Secondary | ICD-10-CM | POA: Insufficient documentation

## 2017-12-07 DIAGNOSIS — M1711 Unilateral primary osteoarthritis, right knee: Secondary | ICD-10-CM | POA: Insufficient documentation

## 2017-12-07 MED ORDER — NAPROXEN 500 MG PO TABS: 500.0000 mg | ORAL_TABLET | Freq: Two times a day (BID) | ORAL | 0 refills | Status: DC

## 2017-12-07 NOTE — ED Triage Notes (Signed)
Pt states she has bilateral knee pain and she feels as though now it hurts to walk. Pt reports swelling. Did have a fall in November. Denies hx of arthritis or gout.

## 2017-12-07 NOTE — Discharge Instructions (Signed)
Please read attached information regarding your condition. Wear knee sleeve as directed. Take naproxen as needed for pain and swelling. Follow-up with the orthopedist listed below for further evaluation if your symptoms persist. Apply ice to the areas as tolerated. Return to ED for worsening symptoms, red hot or tender joints with fever, numbness to legs, injuries or falls.

## 2017-12-07 NOTE — ED Notes (Signed)
Patient transported to X-ray 
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
## 2017-12-07 NOTE — ED Notes (Signed)
Pt verbalizes understanding of d/c instructions. Pt received prescriptions. Pt ambulatory at d/c with all belongings.  

## 2017-12-07 NOTE — ED Notes (Signed)
Bilateral knee pain, last fall was in November.

## 2017-12-09 ENCOUNTER — Ambulatory Visit: Payer: Self-pay | Admitting: Internal Medicine

## 2017-12-09 ENCOUNTER — Other Ambulatory Visit: Payer: Self-pay

## 2017-12-09 VITALS — BP 122/67 | HR 76 | Temp 98.3°F | Wt 273.0 lb

## 2017-12-09 DIAGNOSIS — Z872 Personal history of diseases of the skin and subcutaneous tissue: Secondary | ICD-10-CM

## 2017-12-09 DIAGNOSIS — M170 Bilateral primary osteoarthritis of knee: Secondary | ICD-10-CM | POA: Insufficient documentation

## 2017-12-09 DIAGNOSIS — I10 Essential (primary) hypertension: Secondary | ICD-10-CM

## 2017-12-09 DIAGNOSIS — Z23 Encounter for immunization: Secondary | ICD-10-CM

## 2017-12-09 DIAGNOSIS — Z79899 Other long term (current) drug therapy: Secondary | ICD-10-CM

## 2017-12-09 HISTORY — DX: Bilateral primary osteoarthritis of knee: M17.0

## 2017-12-09 MED ORDER — HYDROCHLOROTHIAZIDE 25 MG PO TABS: 25.0000 mg | ORAL_TABLET | Freq: Every day | ORAL | 3 refills | Status: DC

## 2017-12-09 NOTE — Progress Notes (Signed)
CC: here for ED f/u for bilateral knee pain  HPI:  [name] is a 52 y.o. woman with a past medical history listed below here today for follow up of her bilateral knee pain.  Seen in the ED on 3/11 for same complaint.  X-rays of left knee with progression of femorotibial and patellofemoral osteoarthritis.  X-rays of right knee with progression of tricompartmental osteoarthritis. No acute osseous abnormality.  She was given sleeves to wear on her knees and Naproxen and advised to follow up with orthopedics.  She reports using Tylenol as well for some relief.  Naproxen has helped but makes her tired.  She wore the sleeves to sleep in and felt they wre too tight the next morning.  She has no hx of gout or inflammatory joint disease.  There has been no redness or warmth to her joints.   For details of today's visit and the status of her chronic medical issues please refer to the assessment and plan.   Past Medical History:  Diagnosis Date  . Asthma   . ECZEMA 03/09/2009   Qualifier: Diagnosis of  By: Andrey Campanile  MD, Vikki Ports    . GERD 03/09/2009   Qualifier: Diagnosis of  By: Andrey Campanile  MD, Vikki Ports    . Hypertension    Review of Systems:  Review of Systems  Constitutional: Negative for fever.  Respiratory: Negative for shortness of breath.   Cardiovascular: Negative for chest pain.  Musculoskeletal: Positive for joint pain. Negative for falls.  Skin: Negative for rash.     Physical Exam:  Vitals:   12/09/17 0940  BP: 122/67  Pulse: 76  Temp: 98.3 F (36.8 C)  TempSrc: Oral  SpO2: 100%  Weight: 273 lb (123.8 kg)   Physical Exam  Constitutional: She is oriented to person, place, and time and well-developed, well-nourished, and in no distress.  HENT:  Head: Normocephalic and atraumatic.  Pulmonary/Chest: Effort normal.  Musculoskeletal: She exhibits tenderness. She exhibits no deformity.  She is able to extend and flex her knee with some mild crepitus and discomfort.  Her  strength with knee flexion and extension is intact.  There is no obvious deformity or swelling.   Neurological: She is alert and oriented to person, place, and time.  Psychiatric: Mood and affect normal.    Assessment & Plan:   See Encounters Tab for problem based charting.  Patient discussed with Dr. Josem Kaufmann.  ESSENTIAL HYPERTENSION, BENIGN BP: 122/67   Her BP is controlled today on HCTZ alone.  She is asking for a refill of this medication.  Plan: - Continue HCTZ 25mg  daily.  Refill given - Follow up with PCP  Osteoarthritis of both knees She has bilateral knee OA seen on plain film imaging through the emergency department earlier this month that coincides with progressive knee pain with activity.  She has no history of gout or inflammatory joint disease.  Her history of progressive pain is consistent with osteoarthritis.  In the ED she was given naproxen and knee sleeves for support.  She also reports taking tyelenol PRN.  Plan: - refer to physical therapy - continue naproxen and tylenol PRN.  Advised to avoid taking NSAIDs too frequently - discussed knee injections if her symptoms are uncontrolled in the future. She does have history of eczema in her chart with possible mention of psoriasis in May 2016 office note but no other confirmatory diagnosis.  This would need to be confirmed prior to proceeding with intra-articular steroid injections to  avoid medication-associated trigger of erythrodermic psoriasis

## 2017-12-09 NOTE — Assessment & Plan Note (Signed)
BP: 122/67   Her BP is controlled today on HCTZ alone.  She is asking for a refill of this medication.  Plan: - Continue HCTZ 25mg  daily.  Refill given - Follow up with PCP

## 2017-12-09 NOTE — Assessment & Plan Note (Addendum)
She has bilateral knee OA seen on plain film imaging through the emergency department earlier this month that coincides with progressive knee pain with activity.  She has no history of gout or inflammatory joint disease.  Her history of progressive pain is consistent with osteoarthritis.  In the ED she was given naproxen and knee sleeves for support.  She also reports taking tyelenol PRN.  Plan: - refer to physical therapy - continue naproxen and tylenol PRN.  Advised to avoid taking NSAIDs too frequently - discussed knee injections if her symptoms are uncontrolled in the future. She does have history of eczema in her chart with possible mention of psoriasis in May 2016 office note but no other confirmatory diagnosis.  This would need to be confirmed prior to proceeding with intra-articular steroid injections to avoid medication-associated trigger of erythrodermic psoriasis

## 2017-12-09 NOTE — Patient Instructions (Signed)
FOLLOW-UP INSTRUCTIONS When: next available with Dr Nelson ChimesAmin For: general follow up and knee pain What to bring: medications  I have refilled your blood pressure medications  For knee arthritis, keep using tylenol and naproxen as needed only.  I have referred you to physical therapy for some exercise training.  Use your sleeves from the ED with activity but take them off when you sleep.

## 2017-12-10 ENCOUNTER — Telehealth: Payer: Self-pay | Admitting: *Deleted

## 2017-12-10 NOTE — Telephone Encounter (Signed)
CALLED PATIENT LVM / CALLED REGARDING PT REFERRAL. NO INSURANCE COVERAGE NEEDS TO CALL CLINIC TO SEE DEBORAH HILL. APPOINTMENT KEPT WILL BE OUT OF POCKET. CALL OFFICE OR IF OFFICE CONTACT HER YOU CAN HOLD OR CANCEL APPOINTMENT. ASKED PATIENT TO RETURN CALL TO [facility].

## 2017-12-10 NOTE — Progress Notes (Signed)
Case discussed with Dr. Wallace soon after the resident saw the patient.  We reviewed the resident's history and exam and pertinent patient test results.  I agree with the assessment, diagnosis and plan of care documented in the resident's note. 

## 2017-12-11 ENCOUNTER — Encounter: Payer: Self-pay | Admitting: *Deleted

## 2018-01-06 ENCOUNTER — Ambulatory Visit: Payer: Self-pay | Admitting: Physical Therapy

## 2018-01-11 ENCOUNTER — Ambulatory Visit: Payer: Self-pay | Attending: Internal Medicine | Admitting: Physical Therapy

## 2018-01-11 NOTE — Addendum Note (Signed)
Addended by: Neomia DearPOWERS, Lasonya Hubner E on: 01/11/2018 05:59 PM   Modules accepted: Orders

## 2018-02-03 ENCOUNTER — Ambulatory Visit: Payer: Self-pay

## 2018-02-13 ENCOUNTER — Encounter (HOSPITAL_COMMUNITY): Payer: Self-pay

## 2018-02-13 ENCOUNTER — Other Ambulatory Visit: Payer: Self-pay

## 2018-02-13 ENCOUNTER — Emergency Department (HOSPITAL_COMMUNITY)
Admission: EM | Admit: 2018-02-13 | Discharge: 2018-02-13 | Disposition: A | Discharge status: Home / Self Care | Payer: Self-pay | Attending: Emergency Medicine | Admitting: Emergency Medicine

## 2018-02-13 ENCOUNTER — Emergency Department (HOSPITAL_COMMUNITY): Payer: Self-pay

## 2018-02-13 DIAGNOSIS — I10 Essential (primary) hypertension: Secondary | ICD-10-CM | POA: Insufficient documentation

## 2018-02-13 DIAGNOSIS — Z87891 Personal history of nicotine dependence: Secondary | ICD-10-CM | POA: Insufficient documentation

## 2018-02-13 DIAGNOSIS — M25572 Pain in left ankle and joints of left foot: Secondary | ICD-10-CM | POA: Insufficient documentation

## 2018-02-13 DIAGNOSIS — Z79899 Other long term (current) drug therapy: Secondary | ICD-10-CM | POA: Insufficient documentation

## 2018-02-13 DIAGNOSIS — J45909 Unspecified asthma, uncomplicated: Secondary | ICD-10-CM | POA: Insufficient documentation

## 2018-02-13 MED ORDER — NAPROXEN 500 MG PO TABS: 500.0000 mg | ORAL_TABLET | Freq: Two times a day (BID) | ORAL | 0 refills | Status: DC

## 2018-02-13 NOTE — ED Notes (Signed)
Results reviewed, no changes in acuity at this time 

## 2018-02-13 NOTE — Discharge Instructions (Signed)
Your pain is likely due to chronic degenerative changes and due to bone spur of your left ankle/foot.  Please follow up with podiatrist for further management.  Alternate between ibuprofen and tylenol as needed for pain.

## 2018-02-13 NOTE — ED Notes (Signed)
Pt sts hx of "severe arthritis"

## 2018-02-13 NOTE — ED Triage Notes (Signed)
Pt states she has been having pain and swelling in her ankle X1 month. She states that she wears steel toe shoes at work and has been using ice and heat with no relief. C/O limping r/t pain. Denies injury.

## 2018-02-13 NOTE — ED Notes (Signed)
Patient able to ambulate independently  
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
## 2018-04-09 ENCOUNTER — Ambulatory Visit: Payer: Self-pay | Admitting: Podiatry

## 2018-10-09 ENCOUNTER — Ambulatory Visit: Payer: Self-pay | Admitting: Podiatry

## 2018-10-12 ENCOUNTER — Encounter: Payer: Self-pay | Admitting: Internal Medicine

## 2018-10-13 ENCOUNTER — Ambulatory Visit (INDEPENDENT_AMBULATORY_CARE_PROVIDER_SITE_OTHER): Payer: BLUE CROSS/BLUE SHIELD

## 2018-10-13 ENCOUNTER — Ambulatory Visit: Payer: BLUE CROSS/BLUE SHIELD | Admitting: Podiatry

## 2018-10-13 ENCOUNTER — Encounter: Payer: Self-pay | Admitting: Podiatry

## 2018-10-13 ENCOUNTER — Other Ambulatory Visit: Payer: Self-pay | Admitting: Podiatry

## 2018-10-13 ENCOUNTER — Ambulatory Visit: Payer: BLUE CROSS/BLUE SHIELD

## 2018-10-13 VITALS — BP 110/67 | HR 74

## 2018-10-13 DIAGNOSIS — M79671 Pain in right foot: Secondary | ICD-10-CM | POA: Diagnosis not present

## 2018-10-13 DIAGNOSIS — M779 Enthesopathy, unspecified: Secondary | ICD-10-CM | POA: Diagnosis not present

## 2018-10-13 DIAGNOSIS — M79672 Pain in left foot: Secondary | ICD-10-CM | POA: Diagnosis not present

## 2018-10-13 DIAGNOSIS — M7661 Achilles tendinitis, right leg: Secondary | ICD-10-CM | POA: Diagnosis not present

## 2018-10-13 MED ORDER — DICLOFENAC SODIUM 75 MG PO TBEC: 75.0000 mg | DELAYED_RELEASE_TABLET | Freq: Two times a day (BID) | ORAL | 2 refills | Status: DC

## 2018-10-13 MED ORDER — TRIAMCINOLONE ACETONIDE 10 MG/ML IJ SUSP
10.0000 mg | Freq: Once | INTRAMUSCULAR | Status: AC
  Administered 2018-10-13: 10 mg

## 2018-10-13 NOTE — Patient Instructions (Signed)

## 2018-10-14 NOTE — Progress Notes (Signed)
Subjective:   Patient ID: Lynn Bond, female   DOB: 53 y.o.   MRN: [id number]   HPI Patient presents with exquisite discomfort in the posterior aspect of the right heel and states it is been present over the last few months.  She states it is very hard to walk or wear shoe gear and she is obese which is complicating factor.  Patient states it is gradually become more of an issue for her and it is very hard for her to ambulate currently.  Patient does not smoke likes to be active   Review of Systems  All other systems reviewed and are negative.       Objective:  Physical Exam Vitals signs and nursing note reviewed.  Constitutional:      Appearance: She is well-developed.  Pulmonary:     Effort: Pulmonary effort is normal.  Musculoskeletal: Normal range of motion.  Skin:    General: Skin is warm.  Neurological:     Mental Status: She is alert.     Neurovascular status found to be intact muscle strength is adequate range of motion within normal limits with patient noted to have mild to moderate equinus condition but no indications of tendon dysfunction bilateral.  Patient was found to have good digital perfusion and is well oriented x3     Assessment:  Acute Achilles tendinitis right with inflammation fluid of the medial insertion into the calcaneus     Plan:  H&P condition reviewed and at this point I did discuss careful injection explaining risk of rupture.  Patient wants to undergo this process and I get a focus on the medial side where the pain is worse and I did do a sterile prep and injected carefully 3 mg dexamethasone Kenalog 5 mg Xylocaine advised on ice therapy and did dispense air fracture walker to completely reduce the stress on the posterior heel with encouragement to be very careful with any activity.  Patient will be checked back again in approximately 3 weeks and was encouraged to call with questions  X-rays indicate there is small spur formation with no  indications of stress fracture arthritis

## 2018-11-10 ENCOUNTER — Ambulatory Visit: Payer: BLUE CROSS/BLUE SHIELD | Admitting: Podiatry

## 2018-11-17 ENCOUNTER — Ambulatory Visit: Payer: BLUE CROSS/BLUE SHIELD | Admitting: Podiatry

## 2018-11-23 ENCOUNTER — Encounter: Payer: Self-pay | Admitting: Internal Medicine

## 2018-11-23 ENCOUNTER — Other Ambulatory Visit: Payer: Self-pay

## 2018-11-23 ENCOUNTER — Ambulatory Visit (INDEPENDENT_AMBULATORY_CARE_PROVIDER_SITE_OTHER): Payer: Self-pay | Admitting: Internal Medicine

## 2018-11-23 DIAGNOSIS — Z0000 Encounter for general adult medical examination without abnormal findings: Secondary | ICD-10-CM

## 2018-11-23 DIAGNOSIS — Z79899 Other long term (current) drug therapy: Secondary | ICD-10-CM

## 2018-11-23 DIAGNOSIS — I10 Essential (primary) hypertension: Secondary | ICD-10-CM

## 2018-11-23 DIAGNOSIS — J4540 Moderate persistent asthma, uncomplicated: Secondary | ICD-10-CM

## 2018-11-23 DIAGNOSIS — J4520 Mild intermittent asthma, uncomplicated: Secondary | ICD-10-CM

## 2018-11-23 DIAGNOSIS — M170 Bilateral primary osteoarthritis of knee: Secondary | ICD-10-CM

## 2018-11-23 DIAGNOSIS — G8929 Other chronic pain: Secondary | ICD-10-CM

## 2018-11-23 MED ORDER — ALBUTEROL SULFATE HFA 108 (90 BASE) MCG/ACT IN AERS: 1.0000 | INHALATION_SPRAY | RESPIRATORY_TRACT | 2 refills | Status: DC | PRN

## 2018-11-23 MED ORDER — HYDROCHLOROTHIAZIDE 25 MG PO TABS: 25.0000 mg | ORAL_TABLET | Freq: Every day | ORAL | 3 refills | Status: DC

## 2018-11-23 MED ORDER — DICLOFENAC SODIUM 1 % TD GEL: 4.0000 g | Freq: Four times a day (QID) | TRANSDERMAL | 2 refills | Status: DC

## 2018-11-23 NOTE — Assessment & Plan Note (Signed)
Patient ran out of her albuterol for about a month and beclomethasone inhaler for many months.  Occasionally gets some wheezing.  Denies any recent cough or shortness of breath.  Refill for albuterol PRN was provided.

## 2018-11-23 NOTE — Patient Instructions (Signed)
Thank you for visiting clinic today. As we discussed you need more comprehensive healthcare and your insurance will only provide you with that if you see a [facility] physician. We will just do a kidney function testing today as you are taking this medicine called HCTZ for your blood pressure which needs monitoring of your kidney function.  Please find a primary care physician with the [facility] system so you do not have to pay anything out of pocket per your insurance.

## 2018-11-23 NOTE — Progress Notes (Addendum)
   CC: For follow-up of her hypertension and intermittent asthma.  HPI:  [name] is a 53 y.o. with past medical history as listed below came to the clinic for follow-up of her hypertension and intermittent asthma.  Please see assessment and plan for her chronic conditions.  Past Medical History:  Diagnosis Date  . Asthma   . ECZEMA 03/09/2009   Qualifier: Diagnosis of  By: Andrey Campanile  MD, Vikki Ports    . GERD 03/09/2009   Qualifier: Diagnosis of  By: Andrey Campanile  MD, Vikki Ports    . Hypertension    Review of Systems: Positive for bilateral knee pain worse on right.  Physical Exam:  Vitals:   11/23/18 1317  BP: 132/72  Pulse: 72  Temp: 98.8 F (37.1 C)  TempSrc: Oral  SpO2: 100%  Weight: 276 lb 12.8 oz (125.6 kg)  Height: 5\' 7"  (1.702 m)   General: Vital signs reviewed.  Patient is well-developed and well-nourished, morbidly obese, in no acute distress and cooperative with exam.  Head: Normocephalic and atraumatic. Eyes: EOMI, conjunctivae normal, no scleral icterus.  Neck: Supple, trachea midline, normal ROM, no JVD, masses, thyromegaly, or carotid bruit present.  Cardiovascular: RRR, S1 normal, S2 normal, no murmurs, gallops, or rubs. Pulmonary/Chest: Clear to auscultation bilaterally, no wheezes, rales, or rhonchi. Abdominal: Soft, non-tender, non-distended, BS +, no masses, organomegaly, or guarding present.  Musculoskeletal: Bilateral knee crepitus with normal range of motion.  No erythema, edema or hyperthermia. Extremities: No lower extremity edema bilaterally,  pulses symmetric and intact bilaterally. No cyanosis or clubbing. Neurological: A&O x3, Strength is normal and symmetric bilaterally, cranial nerve II-XII are grossly intact, no focal motor deficit, sensory intact to light touch bilaterally.  Skin: Warm, dry and intact. No rashes or erythema. Psychiatric: Normal mood and affect. speech and behavior is normal. Cognition and memory are normal.  Assessment &  Plan:   See Encounters Tab for problem based charting.  Patient discussed with Dr. Oswaldo Done .

## 2018-11-23 NOTE — Assessment & Plan Note (Signed)
She is due for her health maintenance which include Tdap and flu vaccination, Pap smear, mammogram and colonoscopy.  She will discussed with her insurance before proceeding as at this time she got an insurance with [employer] with [facility].

## 2018-11-23 NOTE — Assessment & Plan Note (Signed)
Patient continued to experience bilateral knee pain, worse on right.  Pain get worse with walking around.  Mild stiffness in the morning which last for few minutes. She has an x-ray proven osteoarthritis. She takes naproxen or Voltaren tablet as needed. Psoriasis was mentioned in 1 of her note in 2016 but patient denies any rash. We about the importance of losing weight about steroid injection, she declined stating that she recently got an injection by her podiatrist for Achilles tendinitis and does not want any other injection at this time. She was also offered physical therapy in the past which she could not get it because of lack of insurance.  She wants to try physical therapy as she has insurance now, but she has to go to a [facility] facility due to insurance restrictions.  -She was given a prescription of Voltaren gel to be used as needed. -We also discussed about the importance of losing weight as that will help with her arthritis and also with knee replacement if needed in the future. -She will continue naproxen or Voltaren tablet given to her by her podiatrist as needed.

## 2018-11-23 NOTE — Assessment & Plan Note (Signed)
BP Readings from Last 3 Encounters:  11/23/18 132/72  10/13/18 110/67  02/13/18 125/90   Her blood pressure was within goal. She is just on HCTZ 25 mg daily and is compliant with her medication. She was asking for a refill which was provided. Check BMP only today as she is on HCTZ and last check was 2 years ago. Currently patient recently got an insurance with [employer] with [facility] plan, we discussed with patient that her insurance will not pay for this visit.  She wants to keep it minimum and will discuss with her insurance as she does not want to move to [facility] as there is no [facility] primary care in [place].

## 2018-11-24 LAB — BMP8+ANION GAP
Anion Gap: 14 mmol/L (ref 10.0–18.0)
BUN/Creatinine Ratio: 14 (ref 9–23)
BUN: 11 mg/dL (ref 6–24)
CO2: 26 mmol/L (ref 20–29)
CREATININE: 0.8 mg/dL (ref 0.57–1.00)
Calcium: 9 mg/dL (ref 8.7–10.2)
Chloride: 102 mmol/L (ref 96–106)
GFR calc Af Amer: 99 mL/min/{1.73_m2} (ref >59)
GFR calc non Af Amer: 86 mL/min/{1.73_m2} (ref >59)
Glucose: 87 mg/dL (ref 65–99)
POTASSIUM: 4.1 mmol/L (ref 3.5–5.2)
SODIUM: 142 mmol/L (ref 134–144)

## 2018-11-24 NOTE — Progress Notes (Signed)
Internal Medicine Clinic Attending  Case discussed with Dr. Amin at the time of the visit.  We reviewed the resident's history and exam and pertinent patient test results.  I agree with the assessment, diagnosis, and plan of care documented in the resident's note.    

## 2018-11-26 ENCOUNTER — Telehealth: Payer: Self-pay | Admitting: *Deleted

## 2018-11-26 NOTE — Telephone Encounter (Signed)
Call to [facility] for insurance information to submit a PA for Diclofenac Gel 1%  .Policy number for plan was obtained from Pharmacy [phone].  Number was used to call [employer].  Given another number as patient's claim is handled in [place] [phone].  Call to [phone].  Claim to be handled by [facility] [phone].  Informed that patient's ID # should start with an R.  Call to [facility] to obtain an R number.  None on file for insurance.  Call to patient to get insurance information so that a PA can be done. Message was left for the patient to call the Clinics.   Angelina Ok, RN 11/26/2018 11:38 AM.

## 2018-12-01 ENCOUNTER — Ambulatory Visit: Payer: BLUE CROSS/BLUE SHIELD | Admitting: Podiatry

## 2018-12-01 ENCOUNTER — Encounter: Payer: Self-pay | Admitting: Podiatry

## 2018-12-01 DIAGNOSIS — M7661 Achilles tendinitis, right leg: Secondary | ICD-10-CM

## 2018-12-01 NOTE — Progress Notes (Signed)
Subjective:   Patient ID: Lynn Bond, female   DOB: 53 y.o.   MRN: [id number]   HPI Patient states that her heel has really started to hurt again but overall she is improved and does not have the same swelling mechanism she did but she cannot wear the boot at work like she would like to and as a result and simply not improving likely help   ROS      Objective:  Physical Exam  Neurovascular status intact with posterior heel pain medial side that is present with mild improvement and definite reduction of edema with tendon found to be strong     Assessment:  Achilles tendinitis right but still is symptomatic currently     Plan:  Reviewed condition and recommended long-term physical therapy and I am sending her to physical therapist for treatment.  I am hopeful this will help to prevent her from needing surgery as obesity is a complicating factor and will make it difficult from a surgical perspective for her and less we just have no other choice due to the extensive pain she is experiencing

## 2018-12-27 ENCOUNTER — Telehealth: Payer: Self-pay

## 2018-12-27 NOTE — Telephone Encounter (Signed)
Requesting to speak with a nurse about getting a refill on allergy med. Please call back.

## 2018-12-29 ENCOUNTER — Ambulatory Visit: Payer: BLUE CROSS/BLUE SHIELD | Admitting: Podiatry

## 2018-12-29 NOTE — Telephone Encounter (Signed)
Pt stated she needs something for her allergies; stated she has tried Claritin which is not working. Requesting rx sent to her pharmacy.  Thanks

## 2018-12-29 NOTE — Telephone Encounter (Signed)
Called pt to clarify which med she needs refilling - no one answer; left message to call the office.

## 2018-12-30 ENCOUNTER — Other Ambulatory Visit: Payer: Self-pay | Admitting: Internal Medicine

## 2018-12-30 DIAGNOSIS — J301 Allergic rhinitis due to pollen: Secondary | ICD-10-CM

## 2018-12-30 MED ORDER — FEXOFENADINE HCL 180 MG PO TABS: 180.0000 mg | ORAL_TABLET | Freq: Every day | ORAL | 2 refills | Status: DC

## 2018-12-30 MED ORDER — FLUTICASONE PROPIONATE 50 MCG/ACT NA SUSP: 1.0000 | Freq: Every day | NASAL | 0 refills | Status: DC

## 2018-12-30 NOTE — Telephone Encounter (Signed)
Did sent a prescription of Allegra and Flonase to her pharmacy.

## 2018-12-30 NOTE — Telephone Encounter (Signed)
Called pt to let her know about new rx's - no answer; left message to call the office. Also Flonase was "Fax" so I called rx to [facility] if it was not received.

## 2019-03-28 ENCOUNTER — Encounter: Payer: Self-pay | Admitting: *Deleted

## 2019-04-21 ENCOUNTER — Encounter: Payer: Self-pay | Admitting: Podiatry

## 2019-04-21 ENCOUNTER — Other Ambulatory Visit: Payer: Self-pay

## 2019-04-21 ENCOUNTER — Ambulatory Visit: Payer: BLUE CROSS/BLUE SHIELD | Admitting: Podiatry

## 2019-04-21 VITALS — Temp 98.1°F

## 2019-04-21 DIAGNOSIS — M7661 Achilles tendinitis, right leg: Secondary | ICD-10-CM | POA: Diagnosis not present

## 2019-04-21 DIAGNOSIS — M7662 Achilles tendinitis, left leg: Secondary | ICD-10-CM

## 2019-04-21 NOTE — Patient Instructions (Signed)
Pre-Operative Instructions  Congratulations, you have decided to take an important step towards improving your quality of life.  You can be assured that the doctors and staff at [facility] will be with you every step of the way.  Here are some important things you should know:  1. Plan to be at the surgery center/hospital at least 1 (one) hour prior to your scheduled time, unless otherwise directed by the surgical center/hospital staff.  You must have a responsible adult accompany you, remain during the surgery and drive you home.  Make sure you have directions to the surgical center/hospital to ensure you arrive on time. 2. If you are having surgery at [facility] or [facility], you will need a copy of your medical history and physical form from your family physician within one month prior to the date of surgery. We will give you a form for your primary physician to complete.  3. We make every effort to accommodate the date you request for surgery.  However, there are times where surgery dates or times have to be moved.  We will contact you as soon as possible if a change in schedule is required.   4. No aspirin/ibuprofen for one week before surgery.  If you are on aspirin, any non-steroidal anti-inflammatory medications (Mobic, Aleve, Ibuprofen) should not be taken seven (7) days prior to your surgery.  You make take Tylenol for pain prior to surgery.  5. Medications - If you are taking daily heart and blood pressure medications, seizure, reflux, allergy, asthma, anxiety, pain or diabetes medications, make sure you notify the surgery center/hospital before the day of surgery so they can tell you which medications you should take or avoid the day of surgery. 6. No food or drink after midnight the night before surgery unless directed otherwise by surgical center/hospital staff. 7. No alcoholic beverages 24-hours prior to surgery.  No smoking 24-hours prior or 24-hours after  surgery. 8. Wear loose pants or shorts. They should be loose enough to fit over bandages, boots, and casts. 9. Don't wear slip-on shoes. Sneakers are preferred. 10. Bring your boot with you to the surgery center/hospital.  Also bring crutches or a walker if your physician has prescribed it for you.  If you do not have this equipment, it will be provided for you after surgery. 11. If you have not been contacted by the surgery center/hospital by the day before your surgery, call to confirm the date and time of your surgery. 12. Leave-time from work may vary depending on the type of surgery you have.  Appropriate arrangements should be made prior to surgery with your employer. 13. Prescriptions will be provided immediately following surgery by your doctor.  Fill these as soon as possible after surgery and take the medication as directed. Pain medications will not be refilled on weekends and must be approved by the doctor. 14. Remove nail polish on the operative foot and avoid getting pedicures prior to surgery. 15. Wash the night before surgery.  The night before surgery wash the foot and leg well with water and the antibacterial soap provided. Be sure to pay special attention to beneath the toenails and in between the toes.  Wash for at least three (3) minutes. Rinse thoroughly with water and dry well with a towel.  Perform this wash unless told not to do so by your physician.  Enclosed: 1 Ice pack (please put in freezer the night before surgery)   1 Hibiclens skin cleaner     Pre-op instructions  If you have any questions regarding the instructions, please do not hesitate to call our office.  [place]: [address], [place], [place] [address] -- [phone]  [place]: [address] [place], [place] [address] -- [phone]  [place]: [address]  [place], [place] [address] -- [phone]  [place]: [address], [address], [place],  [address] -- [phone]  Website:  [email or web address] 

## 2019-04-22 NOTE — Progress Notes (Signed)
Subjective:   Patient ID: Lynn Bond, female   DOB: 53 y.o.   MRN: [id number]   HPI Patient presents stating that she stayed home for 2 months recently and that she is simply not able to work and the pain is intense in the back of her heel and she needs something done.  States it is just on the 1 side that it really bothers her   ROS      Objective:  Physical Exam  Neurovascular status intact negative Homans sign noted with exquisite discomfort posterior medial aspect right heel with bone protuberance in this area with no central or lateral band involvement of the Achilles tendon.  Patient has immobilized we have used injections anti-inflammatories and shoe gear modifications without relief     Assessment:  Acute Achilles tendinitis right that has not responded to conservative treatment     Plan:  H&P condition reviewed and I recommended a posterior medial resection of the bone with movement of the tendon slightly with possible anchoring.  I did allow her to read consent form going over alternative treatments complications and the fact there is absolutely no long-term guarantees that this will solve the problem and she may need complete detachment of the Achilles at one point in the future.  She understands all of this and she also understands that these do have a tendency to dehisce and that that is a distinct possibility and she is willing to accept risk.  She will be nonweightbearing for approximately 3 to 4 weeks post surgery and she is given all preoperative instructions signed consent form and is scheduled for outpatient surgery and is encouraged to call with any questions concerns she may have and also that there is approximate 6 months to 1 year healing process

## 2019-04-27 ENCOUNTER — Telehealth: Payer: Self-pay | Admitting: *Deleted

## 2019-04-27 NOTE — Telephone Encounter (Signed)
I attempted to call the patient.  She has [facility] and we are not in-network nor is the surgical center.  I left her a message to call me back.

## 2019-04-28 NOTE — Telephone Encounter (Signed)
I left her a message to call me back in regards to her insurance.

## 2019-05-09 NOTE — Telephone Encounter (Addendum)
"  I received a call from someone from the surgical center.  They said they are not in-network with my insurance."  That is correct, we are not in-network either.  "So why are you just telling me this now?  I've been there several times this year."  Someone at the front desk should have informed you while you were checking in.  I have made several attempts to call and inform you.  (The call got disconnected.)  I am calling you back.  So, do you want to cancel your surgery?  "Yes, I don't have the money to do the surgery."  I will cancel it.  I canceled the surgery for 05/10/2019 via the surgical center's [employer] Portal.

## 2019-05-11 DIAGNOSIS — M7662 Achilles tendinitis, left leg: Secondary | ICD-10-CM

## 2019-05-11 HISTORY — DX: Achilles tendinitis, left leg: M76.62

## 2019-07-30 ENCOUNTER — Other Ambulatory Visit: Payer: Self-pay

## 2019-07-30 DIAGNOSIS — Z20822 Contact with and (suspected) exposure to covid-19: Secondary | ICD-10-CM

## 2019-07-31 LAB — NOVEL CORONAVIRUS, NAA: SARS-CoV-2, NAA: NOT DETECTED

## 2019-08-05 HISTORY — PX: HEEL SPUR SURGERY: SHX665

## 2019-10-13 ENCOUNTER — Ambulatory Visit: Payer: PRIVATE HEALTH INSURANCE | Admitting: Internal Medicine

## 2019-10-13 ENCOUNTER — Encounter: Payer: Self-pay | Admitting: Gastroenterology

## 2019-10-13 ENCOUNTER — Encounter: Payer: Self-pay | Admitting: Internal Medicine

## 2019-10-13 ENCOUNTER — Other Ambulatory Visit: Payer: Self-pay

## 2019-10-13 VITALS — BP 134/85 | HR 86 | Temp 98.1°F | Ht 67.0 in | Wt 291.5 lb

## 2019-10-13 DIAGNOSIS — J45909 Unspecified asthma, uncomplicated: Secondary | ICD-10-CM | POA: Diagnosis not present

## 2019-10-13 DIAGNOSIS — Z1231 Encounter for screening mammogram for malignant neoplasm of breast: Secondary | ICD-10-CM

## 2019-10-13 DIAGNOSIS — J4530 Mild persistent asthma, uncomplicated: Secondary | ICD-10-CM

## 2019-10-13 DIAGNOSIS — Z23 Encounter for immunization: Secondary | ICD-10-CM | POA: Diagnosis not present

## 2019-10-13 DIAGNOSIS — I10 Essential (primary) hypertension: Secondary | ICD-10-CM

## 2019-10-13 DIAGNOSIS — Z124 Encounter for screening for malignant neoplasm of cervix: Secondary | ICD-10-CM

## 2019-10-13 DIAGNOSIS — Z7951 Long term (current) use of inhaled steroids: Secondary | ICD-10-CM

## 2019-10-13 DIAGNOSIS — Z79899 Other long term (current) drug therapy: Secondary | ICD-10-CM | POA: Diagnosis not present

## 2019-10-13 DIAGNOSIS — Z131 Encounter for screening for diabetes mellitus: Secondary | ICD-10-CM

## 2019-10-13 DIAGNOSIS — Z0000 Encounter for general adult medical examination without abnormal findings: Secondary | ICD-10-CM

## 2019-10-13 DIAGNOSIS — Z1211 Encounter for screening for malignant neoplasm of colon: Secondary | ICD-10-CM

## 2019-10-13 LAB — POCT GLYCOSYLATED HEMOGLOBIN (HGB A1C): Hemoglobin A1C: 6 % — AB (ref 4.0–5.6)

## 2019-10-13 LAB — GLUCOSE, CAPILLARY: Glucose-Capillary: 128 mg/dL — ABNORMAL HIGH (ref 70–99)

## 2019-10-13 MED ORDER — QVAR 80 MCG/ACT IN AERS: 1.0000 | INHALATION_SPRAY | Freq: Every day | RESPIRATORY_TRACT | 5 refills | Status: DC

## 2019-10-13 MED ORDER — ALBUTEROL SULFATE HFA 108 (90 BASE) MCG/ACT IN AERS: 1.0000 | INHALATION_SPRAY | RESPIRATORY_TRACT | 2 refills | Status: DC | PRN

## 2019-10-13 MED ORDER — HYDROCHLOROTHIAZIDE 25 MG PO TABS: 25.0000 mg | ORAL_TABLET | Freq: Every day | ORAL | 3 refills | Status: DC

## 2019-10-13 NOTE — Assessment & Plan Note (Addendum)
Due to insurance issues, patient was unable to obtain several of her screening exams last year. She wishes to catch up on these now. Orders placed mammogram, GI referral for colonoscopy, Gyn referral for women's health, A1C check. Does not wish for HIV, HCV screening at this time. Agrees to influenza vaccine. Follow up one year.

## 2019-10-13 NOTE — Progress Notes (Signed)
a  CC: hypertension  HPI:  [name] is a 54 y.o. female who presents for follow up on chronic medical conditions including hypertension and asthma. Please see problem based assessment and plan for additional details.     Past Medical History:  Diagnosis Date  . Asthma   . ECZEMA 03/09/2009   Qualifier: Diagnosis of  By: Andrey Campanile  MD, Vikki Ports    . GERD 03/09/2009   Qualifier: Diagnosis of  By: Andrey Campanile  MD, Vikki Ports    . Hypertension     Review of Systems:  Review of Systems - General ROS: negative for - chills, fatigue or fever Respiratory ROS: negative for - cough or shortness of breath Cardiovascular ROS: negative for - chest pain   Physical Exam:  Vitals:   10/13/19 1338  BP: 134/85  Pulse: 86  Temp: 98.1 F (36.7 C)  TempSrc: Oral  SpO2: 100%  Weight: 291 lb 8 oz (132.2 kg)  Height: 5\' 7"  (1.702 m)    GENERAL: well appearing, in no apparent distress CARDIAC: heart regular rate and rhythm, no peripheral edema appreciated PULMONARY: lung sounds clear to auscultation   Assessment & Plan:   See Encounters Tab for problem based charting.  Pertinent labs & imaging results that were available during my care of the patient were reviewed by me and considered in my medical decision making  Patient is in agreement with the plan and endorses no further questions at this time.  Patient seen with Dr. , MD Internal Medicine Resident-PGY1 10/13/19

## 2019-10-13 NOTE — Assessment & Plan Note (Signed)
Current medications: albuterol, beclomethasone inhalers. Patient notes intermittent asthma symptoms which are exacerbated by cold air. No asthma exacerbations in the past year. Only uses her inhalers 1-2x weekly. Denies shortness of breath or cough. No adventitious lung sounds on PE today.  Plan: inhaler refills sent to pharmacy. F/u one year.

## 2019-10-13 NOTE — Patient Instructions (Signed)
It was a pleasure meeting you today! Your blood pressure is on the upper end of where we would like to see it so I would like to see you back in 6 months for a recheck. For now, continue your medication as you have been. I am also going to check some labs today as well as screen you for diabetes. I will call you with those results.  Take care!

## 2019-10-13 NOTE — Assessment & Plan Note (Addendum)
Medications:hydrochlorothiazide 25mg  daily Pt reports compliance with medication. Denies medication side effects. BP is only minimally above goal today at 134/85.   Plan: will obtain BMP today. continue current management. Encouraged conservative measures to assist in lowering blood pressure. F/u 6 months for blood pressure recheck.

## 2019-10-14 LAB — BMP8+ANION GAP
Anion Gap: 14 mmol/L (ref 10.0–18.0)
BUN/Creatinine Ratio: 17 (ref 9–23)
BUN: 14 mg/dL (ref 6–24)
CO2: 25 mmol/L (ref 20–29)
Calcium: 9.3 mg/dL (ref 8.7–10.2)
Chloride: 98 mmol/L (ref 96–106)
Creatinine, Ser: 0.82 mg/dL (ref 0.57–1.00)
GFR calc Af Amer: 95 mL/min/{1.73_m2} (ref >59)
GFR calc non Af Amer: 83 mL/min/{1.73_m2} (ref >59)
Glucose: 129 mg/dL — ABNORMAL HIGH (ref 65–99)
Potassium: 3.6 mmol/L (ref 3.5–5.2)
Sodium: 137 mmol/L (ref 134–144)

## 2019-10-14 NOTE — Progress Notes (Signed)
Internal Medicine Clinic Attending  Case discussed with Dr. Christian at the time of the visit.  We reviewed the resident's history and exam and pertinent patient test results.  I agree with the assessment, diagnosis, and plan of care documented in the resident's note.  Alexander Raines, M.D., Ph.D.  

## 2019-10-18 ENCOUNTER — Telehealth: Payer: Self-pay | Admitting: Internal Medicine

## 2019-10-18 NOTE — Telephone Encounter (Signed)
Attempted to reach patient to discuss her lab work from last week. HIPPA compliant voicemail left with instructions to call the clinic back.

## 2019-10-24 ENCOUNTER — Other Ambulatory Visit: Payer: Self-pay

## 2019-10-24 ENCOUNTER — Telehealth: Payer: Self-pay | Admitting: Internal Medicine

## 2019-10-24 ENCOUNTER — Ambulatory Visit (AMBULATORY_SURGERY_CENTER): Payer: Self-pay | Admitting: *Deleted

## 2019-10-24 VITALS — Temp 96.3°F | Ht 67.0 in | Wt 299.4 lb

## 2019-10-24 DIAGNOSIS — Z1211 Encounter for screening for malignant neoplasm of colon: Secondary | ICD-10-CM

## 2019-10-24 DIAGNOSIS — Z01818 Encounter for other preprocedural examination: Secondary | ICD-10-CM

## 2019-10-24 MED ORDER — NA SULFATE-K SULFATE-MG SULF 17.5-3.13-1.6 GM/177ML PO SOLN: ORAL | 0 refills | Status: DC

## 2019-10-24 NOTE — Telephone Encounter (Signed)
Called and spoke with patient regarding her labs which were essentially unremarkable aside from her A1C. We discussed what this was and factors affecting it. She notes that she has been homebound and sedentary over the past 6 months however is now back at work. I encouraged her to continue to work on diet and exercise and to return in 3-31mo for A1C recheck.

## 2019-10-24 NOTE — Progress Notes (Signed)
Patient is here in-person for PV. Husband with pt in pv (temp 97.7). Patient denies any allergies to eggs or soy. Patient denies any problems with anesthesia/sedation. Patient denies any oxygen use at home. Patient denies taking any diet/weight loss medications or blood thinners. Patient is not being treated for MRSA or C-diff.   COVID-19 screening test is on 1/28 1050am, the pt is aware. Pt is aware that care partner will wait in the car during procedure; if they feel like they will be too hot or cold to wait in the car; they may wait in the 4 th floor lobby. Patient is aware to bring only one care partner. We want them to wear a mask (we do not have any that we can provide them), practice social distancing, and we will check their temperatures when they get here.  I did remind the patient that their care partner needs to stay in the parking lot the entire time and have a cell phone available, we will call them when the pt is ready for discharge. Patient will wear mask into building.    Suprep $15 off coupon given to the patient.

## 2019-10-24 NOTE — Telephone Encounter (Signed)
Pls return pt call regarding results [id number]

## 2019-10-26 ENCOUNTER — Ambulatory Visit: Payer: PRIVATE HEALTH INSURANCE | Attending: Internal Medicine

## 2019-10-26 DIAGNOSIS — Z20822 Contact with and (suspected) exposure to covid-19: Secondary | ICD-10-CM

## 2019-10-27 LAB — NOVEL CORONAVIRUS, NAA: SARS-CoV-2, NAA: DETECTED — AB

## 2019-10-29 ENCOUNTER — Telehealth (HOSPITAL_COMMUNITY): Payer: Self-pay | Admitting: Physician Assistant

## 2019-10-29 NOTE — Telephone Encounter (Signed)
Called to discuss with Lynn Bond about Covid symptoms and the use of bamlanivimab, a monoclonal antibody infusion for those with mild to moderate Covid symptoms and at a high risk of hospitalization.    Pt does not qualify for infusion therapy as sh has asymptomatic infection. Isolation precautions discussed. Advised to contact back for consideration should she develop symptoms. Patient verbalized understanding.      Patient Active Problem List   Diagnosis Date Noted  . Osteoarthritis of both knees 12/09/2017  . Eczema 02/13/2015  . Healthcare maintenance 10/11/2012  . Asthma 01/29/2011  . ANEMIA, MILD 07/08/2010  . GERD 03/09/2009  . 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 OBESITY, UNSPECIFIED 10/11/2008  . ESSENTIAL HYPERTENSION, BENIGN 09/15/2007     Manson Passey, PA

## 2019-11-01 ENCOUNTER — Encounter: Payer: Self-pay | Admitting: Gastroenterology

## 2019-11-07 ENCOUNTER — Encounter: Payer: Self-pay | Admitting: Internal Medicine

## 2019-11-07 ENCOUNTER — Ambulatory Visit: Payer: PRIVATE HEALTH INSURANCE | Attending: Internal Medicine

## 2019-11-07 ENCOUNTER — Telehealth: Payer: Self-pay

## 2019-11-07 DIAGNOSIS — Z20822 Contact with and (suspected) exposure to covid-19: Secondary | ICD-10-CM

## 2019-11-07 NOTE — Telephone Encounter (Signed)
Called pt - stated she needs a letter stating she can go back to work tomorrow; need letter today. Stated she does not have any symptoms after testing positive for covid-19 on 10/25/18. Thanks

## 2019-11-07 NOTE — Telephone Encounter (Signed)
I wrote a note excusing Lynn Bond from work. She may return to work tomorrow.

## 2019-11-07 NOTE — Telephone Encounter (Signed)
Requesting a letter to go back to work. Please call pt back.  

## 2019-11-07 NOTE — Telephone Encounter (Signed)
Pt informed and will call back 2/9 with a fax #

## 2019-11-08 LAB — NOVEL CORONAVIRUS, NAA: SARS-CoV-2, NAA: NOT DETECTED

## 2019-11-09 ENCOUNTER — Encounter: Payer: Self-pay | Admitting: Radiation Oncology

## 2019-11-09 ENCOUNTER — Other Ambulatory Visit: Payer: Self-pay

## 2019-11-09 ENCOUNTER — Ambulatory Visit (INDEPENDENT_AMBULATORY_CARE_PROVIDER_SITE_OTHER): Payer: PRIVATE HEALTH INSURANCE | Admitting: Radiation Oncology

## 2019-11-09 VITALS — BP 140/87 | HR 99 | Temp 98.2°F | Ht 67.0 in | Wt 302.4 lb

## 2019-11-09 DIAGNOSIS — M1711 Unilateral primary osteoarthritis, right knee: Secondary | ICD-10-CM

## 2019-11-09 DIAGNOSIS — M170 Bilateral primary osteoarthritis of knee: Secondary | ICD-10-CM

## 2019-11-09 NOTE — Assessment & Plan Note (Signed)
This issue is chronic and exacerbated.   She has a hx of osteoarthritis of the bilateral knees noted to be worsening on imaging in 2019. She recently had L heel surgery and has gained some weight and been overcompensating with the R leg to relieve pressure on the L heel. She returned to work for the first time since her heel surgery this past Monday and reports pain unrelieved by ibuprofen and ice which usually take away her sx.   She is distressed and tearful in the exam room. She has four kids to provide for and she states that she has to be able to work and she is worried she won't be able to with how much pain she has in her knee. I asked if she had any acute needs including food or housing and she said no.   I asked if she had ever had a steroid shot and she said no. We discussed that while losing weight would of course help her sx that acutely she may be in need of a steroid shot. On exam the right knee was mildly swollen compared to the left without deformity and was not tender to palpation.   She was amenable to and consented for the procedure.   We also discussed the benefits of exercise for osteoarthritis and that it may be important to discuss her knee pain with her orthopaedic surgeon when she follows up for her heel surgery.  Plan: -steroid injection in R knee -return precautions (infection, fever) -weight loss -exercise -fu with ortho

## 2019-11-09 NOTE — Progress Notes (Signed)
Knee Injection Procedure Note  Pre-operative Diagnosis: right knee pain due to progressive oseoarthritis  Indications: Symptom relief from osteoarthritis  Anesthesia: Lidocaine 1% without epinephrine without added sodium bicarbonate  Procedure Details   Physical exam was used to identify the patella and patellar bursa. Consent was obtained for the procedure. The knee was prepped with iodine and alcohol swab. Using a 25 gauge needle the infrapatellar bursa was injected with 2 mL 1% lidocaine and 1 mL of triamcinolone (KENALOG) 40mg /ml. The needle was removed and a dressing was applied.  Complications:  None; patient tolerated the procedure well.

## 2019-11-09 NOTE — Patient Instructions (Addendum)
Thank you for coming to your appointment. It was so nice to see you. Today we discussed  Lynn Bond was seen today for knee pain.  Diagnoses and all orders for this visit:  Knee Pain -steroid injection today -please call us if you develop redness or pain at the injection site or fevers or chills -follow up with your orthopaedic surgeon  Sincerely,  Jenell Milliner, MD

## 2019-11-09 NOTE — Progress Notes (Signed)
   CC: R knee pain  HPI:  Lynn Bond is a 54 y.o. female who presents to the Internal Medicine Clinic for R knee pain Please see Assessment and Plan for full HPI.  Past Medical History:  Diagnosis Date  . Asthma   . ECZEMA 03/09/2009   Qualifier: Diagnosis of  By: Andrey Campanile  MD, Vikki Ports    . GERD 03/09/2009   Qualifier: Diagnosis of  By: Andrey Campanile  MD, Vikki Ports    . Hypertension    Review of Systems:  Please see Assessment and Plan for full ROS.  Physical Exam:  Vitals:   11/09/19 1420  BP: 140/87  Pulse: 99  Temp: 98.2 F (36.8 C)  TempSrc: Oral  SpO2: 100%  Weight: (!) 302 lb 6.4 oz (137.2 kg)  Height: 5\' 7"  (1.702 m)   Physical Exam  Constitutional: She is well-developed, well-nourished, and in no distress. No distress.  obese  Musculoskeletal:        General: Edema (mild edema of R knee compared to left) present. No tenderness (not TTP) or deformity. Normal range of motion.     Cervical back: Normal range of motion.  Neurological: She is alert.  Skin: Skin is warm and dry. No rash noted. She is not diaphoretic. No erythema.  Psychiatric:  Tearful due to pain   Nursing note and vitals reviewed.  Assessment & Plan:   See Encounters Tab for problem based charting.  Patient seen with Dr. 

## 2019-11-10 NOTE — Progress Notes (Signed)
Internal Medicine Clinic Attending  I saw and evaluated the patient.  I personally confirmed the key portions of the history and exam documented by Dr. Lanier and I reviewed pertinent patient test results.  The assessment, diagnosis, and plan were formulated together and I agree with the documentation in the resident's note.   I was present for the entire procedure.  

## 2019-11-28 ENCOUNTER — Encounter: Payer: PRIVATE HEALTH INSURANCE | Admitting: Internal Medicine

## 2019-11-29 ENCOUNTER — Encounter: Payer: Self-pay | Admitting: *Deleted

## 2019-12-02 ENCOUNTER — Encounter: Payer: PRIVATE HEALTH INSURANCE | Admitting: Obstetrics & Gynecology

## 2019-12-04 NOTE — Progress Notes (Signed)
   CC: Foot pain  HPI:  Lynn Bond is a 54 y.o. with a  Hx as noted below who presents for L foot pain .  Please refer to the problem based charting for further details.  Past Medical History:  Diagnosis Date  . Asthma   . ECZEMA 03/09/2009   Qualifier: Diagnosis of  By: Andrey Campanile  MD, Vikki Ports    . GERD 03/09/2009   Qualifier: Diagnosis of  By: Andrey Campanile  MD, Vikki Ports    . Hypertension    Review of Systems:  All systems were reviewed and are otherwise negative unless mentioned in the HPI.  Physical Exam:  Vitals:   12/05/19 1531  BP: 116/71  Pulse: 78  Temp: 98.5 F (36.9 C)  TempSrc: Oral  SpO2: 100%  Weight: (!) 303 lb 6.4 oz (137.6 kg)  Height: 5\' 7"  (1.702 m)    Physical Exam Vitals reviewed.  Constitutional:      General: She is not in acute distress.    Appearance: Normal appearance. She is obese. She is not ill-appearing or toxic-appearing.  HENT:     Head: Normocephalic and atraumatic.  Cardiovascular:     Rate and Rhythm: Normal rate and regular rhythm.     Pulses: Normal pulses.     Heart sounds: Normal heart sounds. No murmur. No friction rub. No gallop.   Pulmonary:     Effort: Pulmonary effort is normal. No respiratory distress.     Breath sounds: Normal breath sounds. No wheezing or rales.  Abdominal:     General: There is no distension.     Palpations: Abdomen is soft.     Tenderness: There is no abdominal tenderness. There is no guarding.  Musculoskeletal:        General: Swelling (mild swelling of the L ankle near achilles) and tenderness (medial achilles tendon at the point of insertion ) present. Normal range of motion.     Comments: 5/5 strength in the L ankle  Neurological:     General: No focal deficit present.     Mental Status: She is alert and oriented to person, place, and time.     Sensory: No sensory deficit.     Motor: No weakness.  Psychiatric:        Mood and Affect: Mood normal.    Assessment & Plan:   See Encounters  Tab for problem based charting.  Patient discussed with Dr. 

## 2019-12-05 ENCOUNTER — Encounter: Payer: Self-pay | Admitting: Internal Medicine

## 2019-12-05 ENCOUNTER — Other Ambulatory Visit: Payer: Self-pay

## 2019-12-05 ENCOUNTER — Ambulatory Visit (INDEPENDENT_AMBULATORY_CARE_PROVIDER_SITE_OTHER): Payer: 59 | Admitting: Internal Medicine

## 2019-12-05 VITALS — BP 116/71 | HR 78 | Temp 98.5°F | Ht 67.0 in | Wt 303.4 lb

## 2019-12-05 DIAGNOSIS — Z23 Encounter for immunization: Secondary | ICD-10-CM

## 2019-12-05 DIAGNOSIS — M7662 Achilles tendinitis, left leg: Secondary | ICD-10-CM | POA: Diagnosis not present

## 2019-12-05 DIAGNOSIS — M170 Bilateral primary osteoarthritis of knee: Secondary | ICD-10-CM | POA: Diagnosis not present

## 2019-12-05 DIAGNOSIS — Z0000 Encounter for general adult medical examination without abnormal findings: Secondary | ICD-10-CM

## 2019-12-05 DIAGNOSIS — Z79899 Other long term (current) drug therapy: Secondary | ICD-10-CM

## 2019-12-05 DIAGNOSIS — J45909 Unspecified asthma, uncomplicated: Secondary | ICD-10-CM

## 2019-12-05 DIAGNOSIS — J301 Allergic rhinitis due to pollen: Secondary | ICD-10-CM

## 2019-12-05 DIAGNOSIS — J4530 Mild persistent asthma, uncomplicated: Secondary | ICD-10-CM

## 2019-12-05 MED ORDER — FEXOFENADINE HCL 180 MG PO TABS: 180.0000 mg | ORAL_TABLET | Freq: Every day | ORAL | 2 refills | Status: DC

## 2019-12-05 MED ORDER — FLUTICASONE PROPIONATE 50 MCG/ACT NA SUSP: 1.0000 | Freq: Every day | NASAL | 0 refills | Status: DC

## 2019-12-05 NOTE — Patient Instructions (Signed)
Thank you for visiting [place] in clinic today.  Below is a summary of what we discussed:  1.  Achilles tendinitis -I think your foot pain is being caused by inflammation of the Achilles tendon.  I have placed a referral to physical therapy.  If you do not get a phone call from them within the next 2 weeks to get an appointment, recheck out to [place] that we can help you. -Continue using taking ibuprofen and Tylenol as needed for the pain. Make sure you take ibuprofen with food.  Ice your ankle elevated as you are resting at home.  2.  Medications -I refilled your medications as requested.  Please let me know if you need any other refills in the meantime.  3. Follow-up -Please follow-up for your foot in 6 to 8 weeks.  -Schedule another appointment to schedule your Pap smear at your convenience.  Any questions or concerns, please feel free to reach out to [place].

## 2019-12-05 NOTE — Assessment & Plan Note (Signed)
Patient had bone spur removed in the left foot 4 months ago at [facility] due to pain.  The patient states her pain improved, but since she started working again a couple weeks ago the pain has worsened.  She describes it as sharp and worsens with prolonged standing, and improves with rest ibuprofen and Tylenol.  She has tenderness to palpation of the medial Achilles tendon, several inches above her she had surgery for bone spur removal.  Patient did see the orthopedist, who prescribed her orthotics but they were too expensive for the patient to afford.  Her insurance status changed, no longer covers her for that orthopedist.   -Continue rest, ice, compression, and elevation as needed -Tylenol and ibuprofen as needed, eat prior to using ibuprofen -Referral to physical therapy placed -Follow-up in 6 to 8 weeks, after the patient has had a chance to work with physical therapy

## 2019-12-05 NOTE — Assessment & Plan Note (Addendum)
Patient would like to get her Pap smear performed at the clinic here.  She states that she would like to have this done and another appointment date.   -Patient will schedule an appointment for Pap smear at her convenience -Patient will receive Tdap shot today

## 2019-12-05 NOTE — Assessment & Plan Note (Signed)
Current medications: Albuterol.  Patient is prescribed beclomethasone inhaler, but she states that her insurance changed and no longer covers this medication.  She rarely uses her rescue inhaler now, and states that her asthma tends to be worse during the winter months anyway. -Discontinue beclomethasone, and consider restarting something else in its place if patient's asthma symptoms worsen

## 2019-12-05 NOTE — Assessment & Plan Note (Signed)
Patient received steroid injection in the right knee and has had significant relief from it.  Her functional status has been better and the pain is well controlled at this time. -Follow-up as needed

## 2019-12-06 NOTE — Progress Notes (Signed)
Internal Medicine Clinic Attending  Case discussed with Dr. Alexander at the time of the visit.  We reviewed the resident's history and exam and pertinent patient test results.  I agree with the assessment, diagnosis, and plan of care documented in the resident's note.  

## 2019-12-08 ENCOUNTER — Ambulatory Visit: Payer: PRIVATE HEALTH INSURANCE

## 2019-12-10 ENCOUNTER — Ambulatory Visit: Payer: 59 | Attending: Internal Medicine

## 2019-12-10 DIAGNOSIS — Z23 Encounter for immunization: Secondary | ICD-10-CM

## 2019-12-10 NOTE — Progress Notes (Signed)
   Covid-19 Vaccination Clinic  Name:  Taquilla Downum    MRN: [id number] DOB: Aug 11, 1966  12/10/2019  Ms. Kossman was observed post Covid-19 immunization for 15 minutes without incident. She was provided with Vaccine Information Sheet and instruction to access the V-Safe system.   Ms. Holsapple was instructed to call 911 with any severe reactions post vaccine: Marland Kitchen Difficulty breathing  . Swelling of face and throat  . A fast heartbeat  . A bad rash all over body  . Dizziness and weakness   Immunizations Administered    Name Date Dose VIS Date Route   Pfizer COVID-19 Vaccine 12/10/2019  4:36 PM 0.3 mL 09/09/2019 Intramuscular   Manufacturer: [employer], [employer]   Lot: [id number]   NDC: [address]-3

## 2019-12-13 ENCOUNTER — Encounter: Payer: Self-pay | Admitting: *Deleted

## 2020-01-02 NOTE — Addendum Note (Signed)
Addended by: Neomia Dear on: 01/02/2020 07:01 PM   Modules accepted: Orders

## 2020-01-03 ENCOUNTER — Ambulatory Visit: Payer: 59

## 2020-01-03 ENCOUNTER — Ambulatory Visit: Payer: 59 | Attending: Internal Medicine

## 2020-01-03 DIAGNOSIS — Z23 Encounter for immunization: Secondary | ICD-10-CM

## 2020-01-03 NOTE — Progress Notes (Signed)
   Covid-19 Vaccination Clinic  Name:  Lynn Bond    MRN: [id number] DOB: 17-Dec-1965  01/03/2020  Lynn Bond was observed post Covid-19 immunization for 30 minutes based on pre-vaccination screening without incident. She was provided with Vaccine Information Sheet and instruction to access the V-Safe system.   Lynn Bond was instructed to call 911 with any severe reactions post vaccine: Marland Kitchen Difficulty breathing  . Swelling of face and throat  . A fast heartbeat  . A bad rash all over body  . Dizziness and weakness   Immunizations Administered    Name Date Dose VIS Date Route   Pfizer COVID-19 Vaccine 01/03/2020  5:19 PM 0.3 mL 09/09/2019 Intramuscular   Manufacturer: [employer], [employer]   Lot: [id number]   NDC: [address]-3

## 2020-01-13 NOTE — Addendum Note (Signed)
Addended by: Neomia Dear on: 01/13/2020 07:02 AM   Modules accepted: Orders

## 2020-02-06 ENCOUNTER — Encounter: Payer: Self-pay | Admitting: *Deleted

## 2020-05-14 ENCOUNTER — Telehealth: Payer: Self-pay

## 2020-05-14 NOTE — Telephone Encounter (Signed)
Returned call to patient. No answer. Left message on VM requesting return call. L. Rune Mendez, RN, BSN     

## 2020-05-14 NOTE — Telephone Encounter (Signed)
Please call pt back about difficult breathing, and asthma problem.

## 2020-05-17 ENCOUNTER — Other Ambulatory Visit: Payer: Self-pay

## 2020-05-17 ENCOUNTER — Ambulatory Visit (INDEPENDENT_AMBULATORY_CARE_PROVIDER_SITE_OTHER): Payer: 59 | Admitting: Internal Medicine

## 2020-05-17 ENCOUNTER — Encounter: Payer: Self-pay | Admitting: Internal Medicine

## 2020-05-17 VITALS — BP 129/86 | HR 72 | Temp 98.1°F | Ht 67.0 in | Wt 288.8 lb

## 2020-05-17 DIAGNOSIS — I10 Essential (primary) hypertension: Secondary | ICD-10-CM | POA: Diagnosis not present

## 2020-05-17 DIAGNOSIS — M170 Bilateral primary osteoarthritis of knee: Secondary | ICD-10-CM | POA: Diagnosis not present

## 2020-05-17 DIAGNOSIS — J4530 Mild persistent asthma, uncomplicated: Secondary | ICD-10-CM | POA: Diagnosis not present

## 2020-05-17 MED ORDER — ALBUTEROL SULFATE HFA 108 (90 BASE) MCG/ACT IN AERS: 1.0000 | INHALATION_SPRAY | RESPIRATORY_TRACT | 2 refills | Status: DC | PRN

## 2020-05-17 MED ORDER — HYDROCHLOROTHIAZIDE 25 MG PO TABS: 25.0000 mg | ORAL_TABLET | Freq: Every day | ORAL | 3 refills | Status: DC

## 2020-05-17 NOTE — Progress Notes (Addendum)
   CC: Knee pain  HPI:  Lynn Bond is a 54 y.o. with a PMHx as listed below who presents to the clinic for knee pain.   Please see the Encounters tab for problem-based Assessment & Plan regarding status of patient's acute and chronic conditions.  Past Medical History:  Diagnosis Date  . Asthma   . ECZEMA 03/09/2009   Qualifier: Diagnosis of  By: Andrey Campanile  MD, Vikki Ports    . GERD 03/09/2009   Qualifier: Diagnosis of  By: Andrey Campanile  MD, Vikki Ports    . Hypertension    Review of Systems: Review of Systems  Constitutional: Negative for chills and fever.  Respiratory: Negative for shortness of breath.   Cardiovascular: Negative for chest pain and palpitations.  Gastrointestinal: Negative for abdominal pain, diarrhea, nausea and vomiting.  Musculoskeletal: Positive for joint pain. Negative for falls.   Physical Exam:  Vitals:   05/17/20 1429  BP: 129/86  Pulse: 72  Temp: 98.1 F (36.7 C)  TempSrc: Oral  SpO2: 100%  Weight: 288 lb 12.8 oz (131 kg)  Height: 5\' 7"  (1.702 m)    Physical Exam Vitals and nursing note reviewed.  Constitutional:      General: She is not in acute distress.    Appearance: She is obese.  Pulmonary:     Effort: Pulmonary effort is normal. No respiratory distress.  Musculoskeletal:     Right knee: Swelling (mildly swollen ) present. No effusion or erythema. Decreased range of motion (2/2 pain with flexion and extension).     Right lower leg: No deformity or tenderness. No edema.     Left lower leg: No edema.  Skin:    General: Skin is warm and dry.  Neurological:     General: No focal deficit present.     Mental Status: She is alert and oriented to person, place, and time. Mental status is at baseline.  Psychiatric:        Mood and Affect: Mood normal.        Behavior: Behavior normal.    Procedure Note Indication:  Osteoarthritis Operators: Drs Hoffman/Marx Doig The patient was provided with risks, benefits, and alternatives to  intraarticular injection. He/She consented to intraarticular knee injection for right knee osteoarthritis.  After a time out was preformed, the knee was prepped in a sterile fashion. Cold spray was applied to the skin over the insertion site (medial inferior patellar aspect of the knee while in the seated position). A mixture of 1% lidocaine and Kenalog was injected into the knee using a 27 gauge needle.  The right/left knee space was entered without difficulty, entered successfully after 1 attempts. The patient tolerated the procedures well without complication.   Assessment & Plan:   See Encounters Tab for problem based charting.  Patient discussed with Dr. 

## 2020-05-17 NOTE — Patient Instructions (Signed)
It was nice seeing you today! Thank you for choosing [facility] for your Primary Care.    Today we talked about:   1. Arthritis in your knees, especially the right: Today, we did a steroid injection. The numbing portion will work immediately and the steroid will begin to work in the next 1-2 days. I also sent in a referral to the Orthopedic surgeon. We will call you to schedule this.   2. Asthma: Let me know how your asthma does with changing location of your work. If you continue to use it daily, schedule a follow up visit to discuss adding a maintenance inhaler. For now, I refilled your Albuterol.   3. High blood pressure medication was refilled

## 2020-05-20 NOTE — Assessment & Plan Note (Signed)
BP: 129/86   Ms. Dehaas denies any difficulty with her current regimen that includes hydrochlorothiazide.  Assessment/plan: Well-controlled at this time.  Hydrochlorothiazide refilled today.

## 2020-05-20 NOTE — Assessment & Plan Note (Signed)
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  This is caused her to require to use her albuterol inhaler 3-4 times per day.  Previously to this move, she was only having to use it once per day.  She notes other triggers for her asthma are that her son moved in with her and he smokes indoors.  This is something she is working on having him quit.  We discussed consideration of a maintenance inhaler, and Lynn Bond is interested but would like to try managing her workplace and home triggers first  Assessment/plan: Exacerbated asthma that was previously mild and persistent.  -Letter provided to patient's workplace that she requires a well ventilated area to work -Encouraged patient continue talking to her son about smoking endorse -Refilled albuterol -Assess severity at next visit, if still using inhaler once to twice per day, consider maintenance therapy

## 2020-05-20 NOTE — Assessment & Plan Note (Signed)
Lynn Bond states that she continues to have severe pain of her right knee secondary to osteoarthritis, for which she has had good response in the past with steroid injections.  At this time she is hoping to have an additional steroid injection into her right knee, as well as referral to orthopedic surgery as she is interested in discussing knee replacement.  She denies any redness, new swelling, decreased range of motion, falls.  Assessment/plan: Examination consistent with chronic well-known osteoarthritis.  Steroid injection into the right knee was provided in the clinic and tolerated well without any complications.  -Referral to orthopedic surgery

## 2020-05-22 NOTE — Progress Notes (Signed)
Internal Medicine Clinic Attending  I saw and evaluated the patient.  I personally confirmed the key portions of the history and exam documented by Dr. Basaraba and I reviewed pertinent patient test results.  The assessment, diagnosis, and plan were formulated together and I agree with the documentation in the resident's note.    

## 2020-05-28 ENCOUNTER — Ambulatory Visit: Payer: 59 | Admitting: Physician Assistant

## 2020-05-30 ENCOUNTER — Other Ambulatory Visit: Payer: Self-pay

## 2020-05-30 ENCOUNTER — Encounter: Payer: Self-pay | Admitting: Family

## 2020-05-30 ENCOUNTER — Telehealth: Payer: Self-pay

## 2020-05-30 ENCOUNTER — Ambulatory Visit (INDEPENDENT_AMBULATORY_CARE_PROVIDER_SITE_OTHER): Payer: 59 | Admitting: Family

## 2020-05-30 VITALS — Ht 67.0 in | Wt 288.0 lb

## 2020-05-30 DIAGNOSIS — M1712 Unilateral primary osteoarthritis, left knee: Secondary | ICD-10-CM

## 2020-05-30 DIAGNOSIS — M1711 Unilateral primary osteoarthritis, right knee: Secondary | ICD-10-CM | POA: Diagnosis not present

## 2020-05-30 MED ORDER — ACETAMINOPHEN 500 MG PO TABS: 500.0000 mg | ORAL_TABLET | Freq: Four times a day (QID) | ORAL | 0 refills | Status: DC | PRN

## 2020-05-30 NOTE — Telephone Encounter (Signed)
-----   Message from Adonis Huguenin, NP sent at 05/30/2020  4:21 PM EDT ----- Supplemental injection for righ tknee please
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
## 2020-05-30 NOTE — Telephone Encounter (Signed)
Noted  

## 2020-06-01 ENCOUNTER — Telehealth: Payer: Self-pay

## 2020-06-01 NOTE — Telephone Encounter (Signed)
Submitted VOB, Monovisc, right knee. 

## 2020-06-21 ENCOUNTER — Telehealth: Payer: Self-pay

## 2020-06-21 NOTE — Telephone Encounter (Signed)
PA required for Monovisc, right knee. Faxed completed PA form to [facility] at [phone].

## 2020-06-25 ENCOUNTER — Encounter: Payer: 59 | Admitting: Student

## 2020-06-27 ENCOUNTER — Telehealth: Payer: Self-pay

## 2020-06-27 ENCOUNTER — Other Ambulatory Visit: Payer: Self-pay | Admitting: Orthopedic Surgery

## 2020-06-27 DIAGNOSIS — M1712 Unilateral primary osteoarthritis, left knee: Secondary | ICD-10-CM

## 2020-06-27 DIAGNOSIS — M1711 Unilateral primary osteoarthritis, right knee: Secondary | ICD-10-CM

## 2020-06-27 NOTE — Telephone Encounter (Signed)
Pls advise.  

## 2020-06-27 NOTE — Telephone Encounter (Signed)
Call patient and stated we will place an order for physical therapy.  We will need to follow-up 4 weeks after starting therapy to see how she progresses.  Discussed that if there is no improvement at that time we could reapply for authorization for hyaluronic acid injection.

## 2020-06-27 NOTE — Telephone Encounter (Addendum)
Received Denial letter from [facility] stating that PA was denied for Monovisc, right knee due to injection not being covered by patient's health plan and due to not trying/failing physical therapy.   Please advise on next option for patient.  Thank you.

## 2020-07-10 ENCOUNTER — Telehealth: Payer: Self-pay

## 2020-07-10 NOTE — Telephone Encounter (Signed)
Called and left a Vm advising patient that she was denied through her insurance for gel injection, due to not trying/failing physical therapy.  Advised her that an order was put in for physical therapy by Dr. Lajoyce Corners and to call back with any further questions.

## 2020-08-09 ENCOUNTER — Ambulatory Visit: Payer: 59 | Admitting: Rehabilitative and Restorative Service Providers"

## 2020-08-09 ENCOUNTER — Other Ambulatory Visit: Payer: Self-pay

## 2020-09-11 ENCOUNTER — Ambulatory Visit: Payer: 59 | Attending: Internal Medicine

## 2020-09-11 DIAGNOSIS — Z23 Encounter for immunization: Secondary | ICD-10-CM

## 2020-09-11 NOTE — Progress Notes (Signed)
   Covid-19 Vaccination Clinic  Name:  Areya Lemmerman    MRN: [id number] DOB: 1965/11/04  09/11/2020  Ms. Kelso was observed post Covid-19 immunization for 15 minutes without incident. She was provided with Vaccine Information Sheet and instruction to access the V-Safe system.   Ms. Gatchel was instructed to call 911 with any severe reactions post vaccine: Marland Kitchen Difficulty breathing  . Swelling of face and throat  . A fast heartbeat  . A bad rash all over body  . Dizziness and weakness   Immunizations Administered    Name Date Dose VIS Date Route   Pfizer COVID-19 Vaccine 09/11/2020  4:02 PM 0.3 mL 07/18/2020 Intramuscular   Manufacturer: [employer], [employer]   Lot: [id number]   NDC: [id number]

## 2021-01-31 ENCOUNTER — Encounter: Payer: 59 | Admitting: Student

## 2021-01-31 ENCOUNTER — Other Ambulatory Visit: Payer: Self-pay

## 2021-01-31 ENCOUNTER — Ambulatory Visit (INDEPENDENT_AMBULATORY_CARE_PROVIDER_SITE_OTHER): Payer: 59 | Admitting: Student

## 2021-01-31 ENCOUNTER — Encounter: Payer: Self-pay | Admitting: Student

## 2021-01-31 VITALS — BP 141/84 | HR 69 | Temp 98.5°F | Ht 67.0 in | Wt 281.1 lb

## 2021-01-31 DIAGNOSIS — I10 Essential (primary) hypertension: Secondary | ICD-10-CM

## 2021-01-31 DIAGNOSIS — Z0000 Encounter for general adult medical examination without abnormal findings: Secondary | ICD-10-CM | POA: Diagnosis not present

## 2021-01-31 DIAGNOSIS — J301 Allergic rhinitis due to pollen: Secondary | ICD-10-CM | POA: Diagnosis not present

## 2021-01-31 DIAGNOSIS — J4530 Mild persistent asthma, uncomplicated: Secondary | ICD-10-CM

## 2021-01-31 DIAGNOSIS — M170 Bilateral primary osteoarthritis of knee: Secondary | ICD-10-CM | POA: Diagnosis not present

## 2021-01-31 MED ORDER — ALBUTEROL SULFATE (2.5 MG/3ML) 0.083% IN NEBU: 2.5000 mg | INHALATION_SOLUTION | RESPIRATORY_TRACT | 2 refills | Status: DC | PRN

## 2021-01-31 MED ORDER — NEBULIZER MASK ADULT MISC: 1.0000 | Freq: Every day | 0 refills | Status: DC | PRN

## 2021-01-31 MED ORDER — FEXOFENADINE HCL 180 MG PO TABS: 180.0000 mg | ORAL_TABLET | Freq: Every day | ORAL | 2 refills | Status: DC

## 2021-01-31 NOTE — Assessment & Plan Note (Signed)
Pressure mildly elevated today at 141/83.  See that she just drank a big cup of coffee prior to the visit.  Reports checking her blood pressure at home and was 134/78 yesterday.  Per chart review, her blood pressure has been well controlled in the past.  Will not adjust the medication today.  Recheck blood pressure at next visit.  -Continue HCTZ 25 mg

## 2021-01-31 NOTE — Assessment & Plan Note (Signed)
Referral to GI for colonoscopy. 

## 2021-01-31 NOTE — Patient Instructions (Addendum)
Lynn Bond,  It was nice seeing you in the clinic today.    1.  Right knee arthritis: Hopefully the steroid injection will last a long time.  2.  High blood pressure: I will not adjust any medication at this time  3.  Allergy: I sent a refill of your medication to your pharmacy.  Take care  Dr. Cyndie Chime

## 2021-01-31 NOTE — Progress Notes (Signed)
   CC: Right knee pain  HPI:  Lynn Bond is a 55 y.o. with past medical history of hypertension, asthma, who presented to the clinic for right knee pain.   Please see problem based charting for further details.  Past Medical History:  Diagnosis Date  . Asthma   . ECZEMA 03/09/2009   Qualifier: Diagnosis of  By: Andrey Campanile  MD, Vikki Ports    . GERD 03/09/2009   Qualifier: Diagnosis of  By: Andrey Campanile  MD, Vikki Ports    . Hypertension    Review of Systems: As per HPI  Physical Exam:  Vitals:   01/31/21 1435 01/31/21 1438  BP: (!) 145/86 (!) 141/83  Pulse: 70 68  Temp: 98.5 F (36.9 C)   TempSrc: Oral   SpO2: 100%   Weight: 281 lb 1.6 oz (127.5 kg)   Height: 5\' 7"  (1.702 m)    Physical Exam Constitutional:      General: She is not in acute distress.    Appearance: She is not toxic-appearing.  HENT:     Head: Normocephalic.  Eyes:     General: No scleral icterus.       Right eye: No discharge.        Left eye: No discharge.     Conjunctiva/sclera: Conjunctivae normal.  Musculoskeletal:     Comments: Normal range of motion of bilateral knees. Right knee appears slightly larger than left knee.  No palpable effusion of right knee.  No erythema, warmth.  Skin:    General: Skin is warm.  Neurological:     Mental Status: She is alert and oriented to person, place, and time.  Psychiatric:        Mood and Affect: Mood normal.        Thought Content: Thought content normal.        Judgment: Judgment normal.     PROCEDURE NOTE  PROCEDURE: right knee joint steroid injection.  PREOPERATIVE DIAGNOSIS: Osteoarthritis of the right knee.  POSTOPERATIVE DIAGNOSIS: Osteoarthritis of the right knee.  PROCEDURE: The patient was apprised of the risks and the benefits of the procedure and informed consent was obtained, as witnessed by . Time-out procedure was performed, with confirmation of the patient's name, date of birth, and correct identification of the right  knee to be injected. The patient's knee was then marked at the appropriate site for injection placement. The knee was sterilely prepped with Betadine. A 40 mg (1 milliliter) solution of Kenalog was drawn up into a 5 mL syringe with a 3 mL of 1% lidocaine. The patient was injected with a 25-gauge needle at the superior lateral aspect of her right extended knee. There were no complications. The patient tolerated the procedure well. There was minimal bleeding. The patient was instructed to ice her knee upon leaving clinic and refrain from overuse over the next 3 days. The patient was instructed to go to the emergency room with any usual pain, swelling, or redness occurred in the injected area. The patient was given a followup appointment to evaluate response to the injection to his increased range of motion and reduction of pain.  The procedure was supervised by attending physician, Dr. Derrell Lolling.  Assessment & Plan:   See Encounters Tab for problem based charting.  Patient discussed with Dr. Mayford Knife

## 2021-01-31 NOTE — Assessment & Plan Note (Signed)
Patient has had right knee pain for 5-6 years.  She endorses normal range of motion of bilateral knees.  Denies fever or chill.  States that her last steroid injection in September 2021 lasted for a long time.  Patient was seen by orthopedics and deferred surgery.  She could not do physical therapy or hyaluronic acid injection due to the cost from her insurance.  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  States that she can tolerate the pain for 12 hours a day.  Knee x-ray in 2019 showed tricompartments severe arthritis. Physical exam did not reveal any right knee effusion or concern for joint infection.  -Steroid injection today -Tylenol or NSAIDs as needed for pain

## 2021-02-05 NOTE — Progress Notes (Signed)
Internal Medicine Clinic Attending  I saw and evaluated the patient.  I personally confirmed the key portions of the history and exam documented by Dr. Cyndie Chime and I reviewed pertinent patient test results.  The assessment, diagnosis, and plan were formulated together and I agree with the documentation in the resident's note. Procedure was supervised by me.

## 2021-04-02 ENCOUNTER — Encounter: Payer: Self-pay | Admitting: *Deleted

## 2021-05-04 ENCOUNTER — Emergency Department (HOSPITAL_COMMUNITY)
Admission: EM | Admit: 2021-05-04 | Discharge: 2021-05-04 | Disposition: A | Discharge status: Home / Self Care | Payer: 59 | Attending: Emergency Medicine | Admitting: Emergency Medicine

## 2021-05-04 ENCOUNTER — Emergency Department (HOSPITAL_COMMUNITY): Payer: 59

## 2021-05-04 ENCOUNTER — Other Ambulatory Visit: Payer: Self-pay

## 2021-05-04 ENCOUNTER — Encounter (HOSPITAL_COMMUNITY): Payer: Self-pay | Admitting: Emergency Medicine

## 2021-05-04 DIAGNOSIS — Z91010 Allergy to peanuts: Secondary | ICD-10-CM | POA: Diagnosis not present

## 2021-05-04 DIAGNOSIS — Z79899 Other long term (current) drug therapy: Secondary | ICD-10-CM | POA: Insufficient documentation

## 2021-05-04 DIAGNOSIS — Z87891 Personal history of nicotine dependence: Secondary | ICD-10-CM | POA: Diagnosis not present

## 2021-05-04 DIAGNOSIS — M25561 Pain in right knee: Secondary | ICD-10-CM | POA: Diagnosis present

## 2021-05-04 DIAGNOSIS — J45909 Unspecified asthma, uncomplicated: Secondary | ICD-10-CM | POA: Diagnosis not present

## 2021-05-04 DIAGNOSIS — W19XXXA Unspecified fall, initial encounter: Secondary | ICD-10-CM

## 2021-05-04 DIAGNOSIS — I10 Essential (primary) hypertension: Secondary | ICD-10-CM | POA: Diagnosis not present

## 2021-05-04 DIAGNOSIS — W010XXA Fall on same level from slipping, tripping and stumbling without subsequent striking against object, initial encounter: Secondary | ICD-10-CM | POA: Diagnosis not present

## 2021-05-04 MED ORDER — NAPROXEN 500 MG PO TABS: 500.0000 mg | ORAL_TABLET | Freq: Two times a day (BID) | ORAL | 0 refills | Status: DC | PRN

## 2021-05-04 NOTE — ED Triage Notes (Signed)
EMS reports pt slipped in water at [employer] and landed on right knee. Pt now c/o pain. No deformity noted. Denies hitting head and blood thinners.

## 2021-05-04 NOTE — Discharge Instructions (Addendum)
Please read and follow all provided instructions.  You have been seen today for right knee pain.   Tests performed today include: An x-ray of the affected area - does NOT show any broken bones or dislocations.  Vital signs. See below for your results today.   Home care instructions: -- *PRICE in the first 24-48 hours after injury: Protect (with brace, splint, sling), if given by your provider Rest Ice- Do not apply ice pack directly to your skin, place towel or similar between your skin and ice/ice pack. Apply ice for 20 min, then remove for 40 min while awake Compression- Wear brace, elastic bandage, splint as directed by your provider Elevate affected extremity above the level of your heart when not walking around for the first 24-48 hours   Medications:  - Naproxen is a nonsteroidal anti-inflammatory medication that will help with pain and swelling. Be sure to take this medication as prescribed with food, 1 pill every 12 hours,  It should be taken with food, as it can cause stomach upset, and more seriously, stomach bleeding. Do not take other nonsteroidal anti-inflammatory medications with this such as Advil, Motrin, Aleve, Mobic, Goodie Powder, or Motrin.    You make take Tylenol per over the counter dosing with these medications.   We have prescribed you new medication(s) today. Discuss the medications prescribed today with your pharmacist as they can have adverse effects and interactions with your other medicines including over the counter and prescribed medications. Seek medical evaluation if you start to experience new or abnormal symptoms after taking one of these medicines, seek care immediately if you start to experience difficulty breathing, feeling of your throat closing, facial swelling, or rash as these could be indications of a more serious allergic reaction   Follow-up instructions: Please follow-up with your primary care provider or the provided orthopedic physician (bone  specialist) if you continue to have significant pain in 1 week. In this case you may have a more severe injury that requires further care.   Return instructions:  Please return if your digits or extremity are numb or tingling, appear gray or blue, or you have severe pain (also elevate the extremity and loosen splint or wrap if you were given one) Please return if you have redness or fevers.  Please return to the Emergency Department if you experience worsening symptoms.  Please return if you have any other emergent concerns. Additional Information:  Your vital signs today were: BP 135/85 (BP Location: Left Arm)   Pulse 67   Temp 98.2 F (36.8 C) (Oral)   Resp 20   SpO2 100%  If your blood pressure (BP) was elevated above 135/85 this visit, please have this repeated by your doctor within one month. ---------------
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
## 2021-05-04 NOTE — ED Provider Notes (Signed)
Emergency Medicine Provider Triage Evaluation Note  Lynn Bond , a 55 y.o. female  was evaluated in triage.  Pt complains of right knee pain.  Patient states that she slipped and fell on wet floor while at [employer] earlier today.  Patient states that when she slipped her right leg bent in an awkward angle and she heard an audible pop.  Patient endorses constant pain to right knee since then.  Patient states that she has not been able to ambulate since the fall.  Patient denies hitting her head or any loss of consciousness.  Patient denies any numbness, weakness, saddle anesthesia, bowel or bladder dysfunction, neck pain, back pain, bowel or bladder dysfunction.  Review of Systems  Positive: Arthralgia Negative: numbness, weakness, saddle anesthesia, bowel or bladder dysfunction, neck pain, back pain, bowel or bladder dysfunction.   Physical Exam  BP 139/90 (BP Location: Left Arm)   Pulse 68   Temp 98.2 F (36.8 C) (Oral)   Resp 16   SpO2 100%  Gen:   Awake, no distress   Resp:  Normal effort  MSK:   Moves extremities without difficulty, diffuse tenderness to right knee, no swelling, crepitus, erythema, or rash. Other:    Medical Decision Making  Medically screening exam initiated at 4:53 PM.  Appropriate orders placed.  Sandria Mcenroe was informed that the remainder of the evaluation will be completed by another provider, this initial triage assessment does not replace that evaluation, and the importance of remaining in the ED until their evaluation is complete.     Haskel Schroeder, PA-C 05/04/21 1702    Glendora Score, MD 05/04/21 2337

## 2021-05-30 ENCOUNTER — Encounter: Payer: Self-pay | Admitting: Student

## 2021-06-21 ENCOUNTER — Other Ambulatory Visit: Payer: Self-pay | Admitting: Internal Medicine

## 2021-06-21 DIAGNOSIS — I10 Essential (primary) hypertension: Secondary | ICD-10-CM

## 2021-07-17 ENCOUNTER — Telehealth: Payer: Self-pay | Admitting: *Deleted

## 2021-07-17 ENCOUNTER — Encounter: Payer: 59 | Admitting: Student

## 2021-07-17 NOTE — Telephone Encounter (Signed)
Called patient regarding her missed appointment at 3:15pm/ patient was instructed to call office at [phone] to reschedule this appointment.

## 2021-07-24 ENCOUNTER — Other Ambulatory Visit: Payer: Self-pay

## 2021-07-24 ENCOUNTER — Ambulatory Visit (INDEPENDENT_AMBULATORY_CARE_PROVIDER_SITE_OTHER): Payer: 59 | Admitting: Student

## 2021-07-24 ENCOUNTER — Encounter: Payer: Self-pay | Admitting: Student

## 2021-07-24 VITALS — BP 139/88 | HR 70 | Temp 98.2°F | Ht 67.0 in | Wt 271.5 lb

## 2021-07-24 DIAGNOSIS — I10 Essential (primary) hypertension: Secondary | ICD-10-CM | POA: Diagnosis not present

## 2021-07-24 DIAGNOSIS — J4530 Mild persistent asthma, uncomplicated: Secondary | ICD-10-CM

## 2021-07-24 DIAGNOSIS — M170 Bilateral primary osteoarthritis of knee: Secondary | ICD-10-CM

## 2021-07-24 DIAGNOSIS — Z0000 Encounter for general adult medical examination without abnormal findings: Secondary | ICD-10-CM

## 2021-07-24 DIAGNOSIS — Z1231 Encounter for screening mammogram for malignant neoplasm of breast: Secondary | ICD-10-CM

## 2021-07-24 DIAGNOSIS — Z1211 Encounter for screening for malignant neoplasm of colon: Secondary | ICD-10-CM

## 2021-07-24 DIAGNOSIS — Z23 Encounter for immunization: Secondary | ICD-10-CM | POA: Diagnosis not present

## 2021-07-24 DIAGNOSIS — R7303 Prediabetes: Secondary | ICD-10-CM | POA: Diagnosis not present

## 2021-07-24 LAB — POCT GLYCOSYLATED HEMOGLOBIN (HGB A1C): Hemoglobin A1C: 5.4 % (ref 4.0–5.6)

## 2021-07-24 LAB — GLUCOSE, CAPILLARY: Glucose-Capillary: 97 mg/dL (ref 70–99)

## 2021-07-24 MED ORDER — ALBUTEROL SULFATE (2.5 MG/3ML) 0.083% IN NEBU: 2.5000 mg | INHALATION_SOLUTION | RESPIRATORY_TRACT | 2 refills | Status: DC | PRN

## 2021-07-24 NOTE — Assessment & Plan Note (Signed)
-   Refill albuterol nebulizer

## 2021-07-24 NOTE — Assessment & Plan Note (Addendum)
-   Flu shot today -Check HIV and hep C => both negative -Colonoscopy referral placed -Mammogram ordered -Patient will need a Pap smear at next visit

## 2021-07-24 NOTE — Progress Notes (Signed)
   CC: Right knee pain  HPI:  [name] is a 55 y.o. with past medical history of hypertension, asthma who presents to the clinic today for follow-up on her right knee pain.  Please see problem based charting for detail  Past Medical History:  Diagnosis Date   Asthma    ECZEMA 03/09/2009   Qualifier: Diagnosis of  By: Andrey Campanile  MD, Valerie     GERD 03/09/2009   Qualifier: Diagnosis of  By: Andrey Campanile  MD, Valerie     Hypertension    Review of Systems: Per HPI  Physical Exam:  Vitals:   07/24/21 1522  BP: (!) 142/85  Pulse: 71  Temp: 98.2 F (36.8 C)  TempSrc: Oral  SpO2: 100%  Weight: 271 lb 8 oz (123.2 kg)  Height: 5\' 7"  (1.702 m)   Physical Exam Constitutional:      General: She is not in acute distress.    Appearance: She is obese.  HENT:     Head: Normocephalic.  Eyes:     General: No scleral icterus.       Right eye: No discharge.        Left eye: No discharge.     Conjunctiva/sclera: Conjunctivae normal.  Cardiovascular:     Rate and Rhythm: Normal rate and regular rhythm.     Heart sounds: No murmur heard. Pulmonary:     Effort: Pulmonary effort is normal. No respiratory distress.     Breath sounds: Normal breath sounds. No wheezing.  Musculoskeletal:     Cervical back: Normal range of motion.     Comments: No right knee joint effusion.  No erythema or warmth to touch.  Normal range of motion of right knee.  Patient is limping due to right knee pain.  Skin:    General: Skin is warm.  Neurological:     Mental Status: She is alert and oriented to person, place, and time.  Psychiatric:        Mood and Affect: Mood normal.        Behavior: Behavior normal.     Assessment & Plan:   See Encounters Tab for problem based charting.  Patient discussed with Dr. 

## 2021-07-24 NOTE — Assessment & Plan Note (Addendum)
Pressure slightly elevated today 139/88.  Patient states that she has not taking her HCTZ this morning.  Her blood pressure was previously well controlled.  She endorses a low-salt diet and adherence to her medication.  -Will not make any adjustment today.  Continue HCTZ 25 mg daily -BMP today  Addendum: BMP unremarkable.

## 2021-07-24 NOTE — Patient Instructions (Addendum)
Ms. Kelner,  It was a pleasure seeing you in the clinic today.  Here is a summary of what we talked about:  1.  Right knee pain: I am sorry that your knee has been hurting for a long time.  I will put in a referral for physical therapy and hopefully you can get the gel injections soon.  In the meantime he can take Tylenol as needed for pain combined with ice and rest.  Please continue to work on your weight loss.  If your knee pain is significantly worsened, please return to clinic for a steroid injection.  2.  High blood pressure: Please continue taking HCTZ.  We will check your blood work for kidney function today.  3.  Your A1c is 5.4 today.  Return in 6 months, we will do your Pap smear next visit.  Take care,  Dr. Cyndie Chime

## 2021-07-24 NOTE — Assessment & Plan Note (Signed)
Patient with history of bilateral knee osteoarthritis presents to clinic for right knee pain.  She was seen in the ED in August after a fall that worsened her right knee pain.  X-ray was done which was negative for fracture and showed moderate tricompartment osteoarthritis.  Patient states that pain is 7 out of 10 currently, worse with walking and better with ice and Tylenol.  States that the pain does not affect her function or work.  She is currently work 2 jobs as Airline pilot.  She is currently working on weight loss and has lost 30 pounds since last year.  She just had a steroid injection in May and states that it helped.  Usually a steroid injection last about 6 to 9 months.  She declined surgical intervention at this time.  She states that her insurance would like for her to get physical therapy before covering for a gel injection.  We discussed different treatments modalities for her chronic right knee osteoarthritis.  Right knee replacement will be the definitive treatment however her BMI is not in the ideal range.  In the meantime, we will continue Tylenol, ice and rest.  Referral for PT placed.  Advised patient to continue working on weight loss for possible knee replacement in the future.  She can come back to the clinic for a steroid injection if her pain worsens.

## 2021-07-25 LAB — BMP8+ANION GAP
Anion Gap: 14 mmol/L (ref 10.0–18.0)
BUN/Creatinine Ratio: 22 (ref 9–23)
BUN: 20 mg/dL (ref 6–24)
CO2: 25 mmol/L (ref 20–29)
Calcium: 8.9 mg/dL (ref 8.7–10.2)
Chloride: 101 mmol/L (ref 96–106)
Creatinine, Ser: 0.9 mg/dL (ref 0.57–1.00)
Glucose: 82 mg/dL (ref 70–99)
Potassium: 4 mmol/L (ref 3.5–5.2)
Sodium: 140 mmol/L (ref 134–144)
eGFR: 76 mL/min/{1.73_m2} (ref >59)

## 2021-07-25 LAB — HIV ANTIBODY (ROUTINE TESTING W REFLEX): HIV Screen 4th Generation wRfx: NONREACTIVE

## 2021-07-25 LAB — HEPATITIS C ANTIBODY: Hep C Virus Ab: <0.1 s/co ratio (ref 0.0–0.9)

## 2021-07-25 NOTE — Progress Notes (Signed)
Internal Medicine Clinic Attending  Case discussed with Dr. Nguyen  At the time of the visit.  We reviewed the resident's history and exam and pertinent patient test results.  I agree with the assessment, diagnosis, and plan of care documented in the resident's note. 

## 2021-07-26 ENCOUNTER — Telehealth: Payer: Self-pay | Admitting: Student

## 2021-07-26 NOTE — Telephone Encounter (Signed)
Pt is rtn a call back to her PCP in reference to her Lab results for her visit on 07/24/2021.  Please call the patient back.

## 2021-08-26 ENCOUNTER — Other Ambulatory Visit: Payer: Self-pay

## 2021-08-26 ENCOUNTER — Ambulatory Visit: Payer: 59 | Attending: Student in an Organized Health Care Education/Training Program

## 2021-08-26 DIAGNOSIS — R2689 Other abnormalities of gait and mobility: Secondary | ICD-10-CM | POA: Insufficient documentation

## 2021-08-26 DIAGNOSIS — M170 Bilateral primary osteoarthritis of knee: Secondary | ICD-10-CM | POA: Insufficient documentation

## 2021-08-26 DIAGNOSIS — G8929 Other chronic pain: Secondary | ICD-10-CM | POA: Diagnosis present

## 2021-08-26 DIAGNOSIS — M6281 Muscle weakness (generalized): Secondary | ICD-10-CM

## 2021-08-26 DIAGNOSIS — M25561 Pain in right knee: Secondary | ICD-10-CM | POA: Insufficient documentation

## 2021-08-26 DIAGNOSIS — R2681 Unsteadiness on feet: Secondary | ICD-10-CM | POA: Diagnosis present
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
## 2021-09-11 ENCOUNTER — Telehealth: Payer: Self-pay | Admitting: Physical Therapy

## 2021-09-11 ENCOUNTER — Ambulatory Visit
Payer: Self-pay | Attending: Student in an Organized Health Care Education/Training Program | Admitting: Physical Therapy

## 2021-09-11 NOTE — Telephone Encounter (Signed)
Attempted to call about missed appt with no answer.

## 2021-09-16 ENCOUNTER — Ambulatory Visit: Payer: Self-pay

## 2021-09-16 ENCOUNTER — Telehealth: Payer: Self-pay

## 2021-09-16 NOTE — Telephone Encounter (Signed)
Called patient regarding missed visit but unable to leave voicemail.  Cancelled all future visits except one on 09/18/21 due to no shows and attendance policy.  [name], PT, DPT 09/16/21 4:47 PM

## 2021-09-18 ENCOUNTER — Ambulatory Visit: Payer: Self-pay

## 2021-09-26 ENCOUNTER — Ambulatory Visit: Payer: Self-pay

## 2021-09-26 ENCOUNTER — Other Ambulatory Visit: Payer: Self-pay | Admitting: Internal Medicine

## 2021-09-26 DIAGNOSIS — I10 Essential (primary) hypertension: Secondary | ICD-10-CM

## 2021-10-03 ENCOUNTER — Ambulatory Visit: Payer: Self-pay

## 2021-11-13 ENCOUNTER — Ambulatory Visit: Payer: PRIVATE HEALTH INSURANCE | Admitting: Student

## 2021-11-13 ENCOUNTER — Encounter: Payer: Self-pay | Admitting: Student

## 2021-11-13 VITALS — BP 169/99 | HR 73 | Temp 98.2°F | Ht 67.0 in | Wt 275.5 lb

## 2021-11-13 DIAGNOSIS — M170 Bilateral primary osteoarthritis of knee: Secondary | ICD-10-CM

## 2021-11-13 DIAGNOSIS — J4530 Mild persistent asthma, uncomplicated: Secondary | ICD-10-CM | POA: Diagnosis not present

## 2021-11-13 DIAGNOSIS — Z6841 Body Mass Index (BMI) 40.0 and over, adult: Secondary | ICD-10-CM

## 2021-11-13 DIAGNOSIS — I10 Essential (primary) hypertension: Secondary | ICD-10-CM

## 2021-11-13 MED ORDER — SEMAGLUTIDE(0.25 OR 0.5MG/DOS) 2 MG/1.5ML ~~LOC~~ SOPN: 0.2500 mg | PEN_INJECTOR | SUBCUTANEOUS | 0 refills | Status: DC

## 2021-11-13 MED ORDER — ALBUTEROL SULFATE (2.5 MG/3ML) 0.083% IN NEBU: 2.5000 mg | INHALATION_SOLUTION | RESPIRATORY_TRACT | 2 refills | Status: DC | PRN

## 2021-11-13 NOTE — Patient Instructions (Addendum)
Lynn Bond,  It was a pleasure seeing you in the clinic today. I am sorry that your right knee pain is getting worse.  We will give you a steroid injection on Friday morning when you off work.  In the meantime you can take Tylenol or ibuprofen as needed for your pain.  You can also try ice to reduce inflammation and swelling.  Try to wear a knee brace when you work.  Regarding your weight, I started you on a medication called semaglutide.  It is a once weekly injection.  Please let me know if you have any severe side effects from this medication.  Our goal is to try to get you to the ideal weight for a knee replacement.  Take care,  Dr. Alfonse Spruce

## 2021-11-13 NOTE — Assessment & Plan Note (Signed)
Her blood pressure is elevated in the setting of worsening right knee pain.  Will reassess when pain improved.

## 2021-11-13 NOTE — Progress Notes (Signed)
° °  CC: Right knee pain  HPI:  Lynn Bond is a 56 y.o. with past medical history of hypertension, morbid obesity and osteoarthritis of bilateral knee who presents to clinic for worsening right knee pain.  Please see problem based charting for detail  Past Medical History:  Diagnosis Date   Asthma    ECZEMA 03/09/2009   Qualifier: Diagnosis of  By: Andrey Campanile  MD, Valerie     GERD 03/09/2009   Qualifier: Diagnosis of  By: Andrey Campanile  MD, Valerie     Hypertension    Review of Systems:  per HPI  Physical Exam:  Vitals:   11/13/21 1559  BP: (!) 169/99  Pulse: 73  Temp: 98.2 F (36.8 C)  TempSrc: Oral  SpO2: 100%  Weight: 275 lb 8 oz (125 kg)  Height: 5\' 7"  (1.702 m)   Physical Exam Constitutional:      General: She is not in acute distress. HENT:     Head: Normocephalic.  Eyes:     General:        Right eye: No discharge.        Left eye: No discharge.     Conjunctiva/sclera: Conjunctivae normal.  Musculoskeletal:     Comments: Right knee slightly swollen.  No erythema or warmth to touch.  Limited range of motion due to pain.  Pain to palpation in the medial side.  No lower extremity edema.  Skin:    General: Skin is warm.  Neurological:     Mental Status: She is alert.  Psychiatric:        Mood and Affect: Mood normal.        Behavior: Behavior normal.     Assessment & Plan:   See Encounters Tab for problem based charting.  Patient discussed with Dr. 

## 2021-11-13 NOTE — Assessment & Plan Note (Signed)
Patient has not tried any medication for weight loss.  Exercise limited due to pain.  -Will try Ozempic 0.25 milligram weekly and titrate up every 4 weeks if tolerated.

## 2021-11-13 NOTE — Assessment & Plan Note (Addendum)
Patient presented with worsening right knee pain.  She has history of osteoarthritis of bilateral knees.  She had a fall in August and right knee x-rays show moderate tricompartmental osteoarthritis.  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  States that her right knee pain has impaired her ability to work.  She has tried over-the-counter medicine, topical medicine and patches without much relief.  Also tried knee braces.  She has had steroid injection in the past and said it lasted for about 1 or 2 months.  She was seen by orthopedic but her BMI is not ideal for knee replacement.  Right knee physical exam was negative for any red flag or concerning features.  For her right knee arthritis, the definitive treatment will be right knee replacement.  However her weight is not ideal for the surgery.  We will plan to have a steroid injection in 2 days.  In the meantime advised patient to continue Tylenol or NSAIDs as needed for pain.  We will also work on getting her to the ideal BMI (35) for the surgery.

## 2021-11-14 NOTE — Progress Notes (Signed)
Internal Medicine Clinic Attending  Case discussed with Dr. Nguyen  At the time of the visit.  We reviewed the resident's history and exam and pertinent patient test results.  I agree with the assessment, diagnosis, and plan of care documented in the resident's note. 

## 2021-11-15 ENCOUNTER — Other Ambulatory Visit: Payer: Self-pay

## 2021-11-15 ENCOUNTER — Ambulatory Visit: Payer: PRIVATE HEALTH INSURANCE | Admitting: Student

## 2021-11-15 DIAGNOSIS — M1711 Unilateral primary osteoarthritis, right knee: Secondary | ICD-10-CM | POA: Diagnosis not present

## 2021-11-15 DIAGNOSIS — Z6841 Body Mass Index (BMI) 40.0 and over, adult: Secondary | ICD-10-CM

## 2021-11-15 DIAGNOSIS — M170 Bilateral primary osteoarthritis of knee: Secondary | ICD-10-CM

## 2021-11-15 NOTE — Assessment & Plan Note (Addendum)
-  Right knee steroid injection performed today.  I will call her on Monday to follow-up. -She states that Ozempic is on bac korder.  Advised patient to let me know in a few weeks if she still has not gotten the medication.  Addendum: Call back post procedure.  She reports doing well.  Said the pain has improved.  No question or concern.

## 2021-11-15 NOTE — Progress Notes (Signed)
PROCEDURE NOTE  PROCEDURE: right knee joint steroid injection.  PREOPERATIVE DIAGNOSIS: Osteoarthritis of the right knee.  POSTOPERATIVE DIAGNOSIS: Osteoarthritis of the right knee.  PROCEDURE: The patient was apprised of the risks and the benefits of the procedure and informed consent was obtained, as witnessed by Dr. Oswaldo Done. Time-out procedure was performed, with confirmation of the patient's name, date of birth, and correct identification of the right knee to be injected.   Bedside ultrasound was utilized and identified a small fluid pocket in the superior lateral aspect. The patient's knee was then marked at the appropriate site for injection placement. The knee was sterilely prepped with Betadine. A 20 mg (0.5 milliliter) solution of Kenalog was drawn up into a 5 mL syringe with a 2 mL of 1% lidocaine. The patient was injected with a 25-gauge needle at the Superior lateral aspect of her right knee. There were no complications. The patient tolerated the procedure well. There was minimal bleeding. The patient was instructed to ice her knee upon leaving clinic and refrain from overuse over the next 3 days. The patient was instructed to go to the emergency room with any usual pain, swelling, or redness occurred in the injected area. The patient was given a followup appointment to evaluate response to the injection to his increased range of motion and reduction of pain.  The procedure was supervised by attending physician, Dr. Oswaldo Done.

## 2021-11-16 ENCOUNTER — Other Ambulatory Visit: Payer: Self-pay | Admitting: Internal Medicine

## 2021-11-16 DIAGNOSIS — I10 Essential (primary) hypertension: Secondary | ICD-10-CM

## 2021-11-18 NOTE — Progress Notes (Signed)
Internal Medicine Clinic Attending  I saw and evaluated the patient.  I personally confirmed the key portions of the history and exam documented by Dr. Nguyen and I reviewed pertinent patient test results.  The assessment, diagnosis, and plan were formulated together and I agree with the documentation in the resident's note. I was present for the entirety of the procedure. 

## 2021-11-20 ENCOUNTER — Other Ambulatory Visit (HOSPITAL_COMMUNITY): Payer: Self-pay

## 2021-11-20 MED ORDER — SEMAGLUTIDE(0.25 OR 0.5MG/DOS) 2 MG/1.5ML ~~LOC~~ SOPN
0.2500 mg | PEN_INJECTOR | SUBCUTANEOUS | 2 refills | Status: DC
  Filled 2021-11-20 – 2021-11-28 (×4): qty 1.5, 56d supply, fill #0

## 2021-11-20 NOTE — Addendum Note (Signed)
Addended byDoran Stabler on: 11/20/2021 01:27 PM   Modules accepted: Orders

## 2021-11-21 ENCOUNTER — Other Ambulatory Visit (HOSPITAL_COMMUNITY): Payer: Self-pay

## 2021-11-28 ENCOUNTER — Other Ambulatory Visit (HOSPITAL_COMMUNITY): Payer: Self-pay

## 2021-11-28 ENCOUNTER — Telehealth: Payer: Self-pay

## 2021-11-28 DIAGNOSIS — Z6841 Body Mass Index (BMI) 40.0 and over, adult: Secondary | ICD-10-CM

## 2021-11-28 NOTE — Telephone Encounter (Signed)
Pa for pt [facility] ) came through on cover my meds was submitted with office notes  from 2/15... awaiting approval or denial  ?

## 2021-12-02 ENCOUNTER — Other Ambulatory Visit (HOSPITAL_COMMUNITY): Payer: Self-pay

## 2021-12-02 MED ORDER — WEGOVY 0.25 MG/0.5ML ~~LOC~~ SOAJ
0.2500 mg | SUBCUTANEOUS | 0 refills | Status: AC
  Filled 2021-12-02 (×2): qty 2, 28d supply, fill #0

## 2021-12-02 NOTE — Telephone Encounter (Signed)
DECISION: ? ? ? Denied  ? ? ? ? Denied for the following reasons : ? ? The information provided by your prescriber does not meet [employer]'s guideline. The guideline used is: GLP-1 (glucagon-like peptide-1) Agonists Step Therapy. Information provided does not show that the policy requirements have been met. The requirement(s) not met are: 1.) A required diagnosis of type 2 diabetes mellitus; AND submission of medical records confirming the diagnosis of type 2 diabetes; AND ONE of the following: - Information indicating current treatment with Ozempic within the past 90 days; OR - the prescriber stating currently being treated with Ozempic within the past 90 days AND at risk if therapy is changed; OR - a medication history that shows the use of one or more of the following: an agent containing metformin or insulin within the past 90 days; OR - an intolerance or hypersensitivity to ONE of the following agents: metformin or insulin; OR - an FDA labeled contraindication to ALL of the following agents: metformin AND insulin; OR 2.) a diagnosis of type 2 diabetes with or at high risk for atherosclerotic cardiovascular disease, heart failure, and/or chronic kidney disease. Therefore, coverage for Ozempic (0.25 or 0.5 MG/DOSE) $RemoveBefo'2MG'gJrwZCguGJL$ /1.5ML Solution Pen-Injector is denied. ? ? ? ? DIABETES MEDS ARE NO LONGER COVERED BY INSURANCE FOR WEIGHT LOSS  ? ?( PCP NOTIFIED AND COPY PLACED TO BE SCANNED TO CHART ALSO )  ?

## 2021-12-03 ENCOUNTER — Telehealth: Payer: Self-pay

## 2021-12-03 NOTE — Telephone Encounter (Signed)
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 was submitted with office notes and labs awaiting approval or denial  ?

## 2021-12-03 NOTE — Telephone Encounter (Signed)
DECISION :  ? ?Denied  ? ? ? ? Pharmacy also sent a form to filled and sent in to rx benefits  awaiting approval or denial  ? ? ? FAXED to [phone] along with office notes  ?

## 2022-02-23 ENCOUNTER — Other Ambulatory Visit: Payer: Self-pay | Admitting: Student

## 2022-02-23 DIAGNOSIS — I10 Essential (primary) hypertension: Secondary | ICD-10-CM

## 2022-04-20 ENCOUNTER — Encounter (HOSPITAL_COMMUNITY): Payer: Self-pay

## 2022-04-20 ENCOUNTER — Ambulatory Visit (HOSPITAL_COMMUNITY)
Admission: EM | Admit: 2022-04-20 | Discharge: 2022-04-20 | Disposition: A | Discharge status: Home / Self Care | Payer: Commercial Managed Care - HMO | Attending: Emergency Medicine | Admitting: Emergency Medicine

## 2022-04-20 DIAGNOSIS — T7840XA Allergy, unspecified, initial encounter: Secondary | ICD-10-CM | POA: Diagnosis not present

## 2022-04-20 DIAGNOSIS — H5789 Other specified disorders of eye and adnexa: Secondary | ICD-10-CM

## 2022-04-20 MED ORDER — STERILE WATER FOR INJECTION IJ SOLN
INTRAMUSCULAR | Status: AC
  Filled 2022-04-20: qty 10

## 2022-04-20 MED ORDER — METHYLPREDNISOLONE SODIUM SUCC 125 MG IJ SOLR
125.0000 mg | Freq: Once | INTRAMUSCULAR | Status: AC
  Administered 2022-04-20: 125 mg via INTRAMUSCULAR

## 2022-04-20 MED ORDER — METHYLPREDNISOLONE SODIUM SUCC 125 MG IJ SOLR
INTRAMUSCULAR | Status: AC
  Filled 2022-04-20: qty 2

## 2022-04-20 MED ORDER — PREDNISONE 20 MG PO TABS: 40.0000 mg | ORAL_TABLET | Freq: Every day | ORAL | 0 refills | Status: DC

## 2022-04-20 NOTE — Discharge Instructions (Signed)
Today being treated for an allergic reaction to hazelnuts  You have been given an injection of steroids here today in the office to help minimize your inflammation  Starting tomorrow take prednisone every morning with food for 5 days to continue this process above  At any point if you begin to have difficulty breathing please go to the nearest emergency department for further evaluation and management

## 2022-04-20 NOTE — ED Triage Notes (Signed)
Pt presents to the office for possible allergic reaction. Pt reports opening some nutrella cream and started breaking out all over her body.
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
## 2022-06-05 ENCOUNTER — Encounter (INDEPENDENT_AMBULATORY_CARE_PROVIDER_SITE_OTHER): Payer: Self-pay | Admitting: Family Medicine

## 2022-06-05 DIAGNOSIS — Z0289 Encounter for other administrative examinations: Secondary | ICD-10-CM

## 2022-06-10 ENCOUNTER — Encounter (INDEPENDENT_AMBULATORY_CARE_PROVIDER_SITE_OTHER): Payer: Self-pay | Admitting: Family Medicine

## 2022-06-10 ENCOUNTER — Ambulatory Visit (INDEPENDENT_AMBULATORY_CARE_PROVIDER_SITE_OTHER): Payer: Commercial Managed Care - HMO | Admitting: Family Medicine

## 2022-06-10 VITALS — BP 139/90 | HR 66 | Temp 97.8°F | Ht 66.0 in | Wt 262.0 lb

## 2022-06-10 DIAGNOSIS — R5383 Other fatigue: Secondary | ICD-10-CM | POA: Diagnosis not present

## 2022-06-10 DIAGNOSIS — Z6841 Body Mass Index (BMI) 40.0 and over, adult: Secondary | ICD-10-CM | POA: Diagnosis not present

## 2022-06-10 DIAGNOSIS — M1711 Unilateral primary osteoarthritis, right knee: Secondary | ICD-10-CM

## 2022-06-10 DIAGNOSIS — R0602 Shortness of breath: Secondary | ICD-10-CM | POA: Diagnosis not present

## 2022-06-10 DIAGNOSIS — I10 Essential (primary) hypertension: Secondary | ICD-10-CM | POA: Diagnosis not present

## 2022-06-10 DIAGNOSIS — Z1331 Encounter for screening for depression: Secondary | ICD-10-CM

## 2022-06-10 DIAGNOSIS — E669 Obesity, unspecified: Secondary | ICD-10-CM

## 2022-06-11 LAB — LIPID PANEL
Chol/HDL Ratio: 2.7 ratio (ref 0.0–4.4)
Cholesterol, Total: 162 mg/dL (ref 100–199)
HDL: 60 mg/dL (ref >39)
LDL Chol Calc (NIH): 92 mg/dL (ref 0–99)
Triglycerides: 49 mg/dL (ref 0–149)
VLDL Cholesterol Cal: 10 mg/dL (ref 5–40)

## 2022-06-11 LAB — CBC WITH DIFFERENTIAL/PLATELET
Basophils Absolute: 0 10*3/uL (ref 0.0–0.2)
Basos: 1 %
EOS (ABSOLUTE): 0.3 10*3/uL (ref 0.0–0.4)
Eos: 5 %
Hematocrit: 38.6 % (ref 34.0–46.6)
Hemoglobin: 11.4 g/dL (ref 11.1–15.9)
Immature Grans (Abs): 0 10*3/uL (ref 0.0–0.1)
Immature Granulocytes: 0 %
Lymphocytes Absolute: 1.2 10*3/uL (ref 0.7–3.1)
Lymphs: 22 %
MCH: 23.2 pg — ABNORMAL LOW (ref 26.6–33.0)
MCHC: 29.5 g/dL — ABNORMAL LOW (ref 31.5–35.7)
MCV: 79 fL (ref 79–97)
Monocytes Absolute: 0.7 10*3/uL (ref 0.1–0.9)
Monocytes: 14 %
Neutrophils Absolute: 3.1 10*3/uL (ref 1.4–7.0)
Neutrophils: 58 %
Platelets: 314 10*3/uL (ref 150–450)
RBC: 4.91 x10E6/uL (ref 3.77–5.28)
RDW: 16.8 % — ABNORMAL HIGH (ref 11.7–15.4)
WBC: 5.2 10*3/uL (ref 3.4–10.8)

## 2022-06-11 LAB — INSULIN, RANDOM: INSULIN: 6.2 u[IU]/mL (ref 2.6–24.9)

## 2022-06-11 LAB — COMPREHENSIVE METABOLIC PANEL
ALT: 11 IU/L (ref 0–32)
AST: 11 IU/L (ref 0–40)
Albumin/Globulin Ratio: 1.3 (ref 1.2–2.2)
Albumin: 4 g/dL (ref 3.8–4.9)
Alkaline Phosphatase: 71 IU/L (ref 44–121)
BUN/Creatinine Ratio: 21 (ref 9–23)
BUN: 17 mg/dL (ref 6–24)
Bilirubin Total: 0.4 mg/dL (ref 0.0–1.2)
CO2: 24 mmol/L (ref 20–29)
Calcium: 9.6 mg/dL (ref 8.7–10.2)
Chloride: 101 mmol/L (ref 96–106)
Creatinine, Ser: 0.81 mg/dL (ref 0.57–1.00)
Globulin, Total: 3.2 g/dL (ref 1.5–4.5)
Glucose: 80 mg/dL (ref 70–99)
Potassium: 3.8 mmol/L (ref 3.5–5.2)
Sodium: 142 mmol/L (ref 134–144)
Total Protein: 7.2 g/dL (ref 6.0–8.5)
eGFR: 86 mL/min/{1.73_m2} (ref >59)

## 2022-06-11 LAB — TSH: TSH: 1.29 u[IU]/mL (ref 0.450–4.500)

## 2022-06-11 LAB — HEMOGLOBIN A1C
Est. average glucose Bld gHb Est-mCnc: 120 mg/dL
Hgb A1c MFr Bld: 5.8 % — ABNORMAL HIGH (ref 4.8–5.6)

## 2022-06-11 LAB — VITAMIN D 25 HYDROXY (VIT D DEFICIENCY, FRACTURES): Vit D, 25-Hydroxy: 19.8 ng/mL — ABNORMAL LOW (ref 30.0–100.0)

## 2022-06-11 LAB — VITAMIN B12: Vitamin B-12: 574 pg/mL (ref 232–1245)

## 2022-06-11 LAB — T4, FREE: Free T4: 1.13 ng/dL (ref 0.82–1.77)

## 2022-06-16 NOTE — Progress Notes (Unsigned)
Chief Complaint:   OBESITY Lynn Bond (MR# [id number]) is a 56 y.o. female who presents for evaluation and treatment of obesity and related comorbidities. Current BMI is Body mass index is 42.29 kg/m. Lynn Bond has been struggling with her weight for many years and has been unsuccessful in either losing weight, maintaining weight loss, or reaching her healthy weight goal.  Needs weight loss to have right total knee replacement.  Weight is up and down since the 90's.  Finds it harder to maintain weight loss now.  Positive family history of obesity.  Denies over eating with meals.  Works multiple jobs.  Walks a lot at work.  Denies meal skipping.  Likes Pepsi, cut out added sugar to coffee.  Never on AOM's.   Lynn Bond is currently in the action stage of change and ready to dedicate time achieving and maintaining a healthier weight. Lynn Bond is interested in becoming our patient and working on intensive lifestyle modifications including (but not limited to) diet and exercise for weight loss.  Lynn Bond's habits were reviewed today and are as follows: Her family eats meals together, she struggles with family and or coworkers weight loss sabotage, her desired weight loss is 72 lbs, she has been heavy most of her life, she started gaining weight after her first child, her heaviest weight ever was 328 pounds, she has significant food cravings issues, and she is frequently drinking liquids with calories.  Depression Screen Lynn Bond's Food and Mood (modified PHQ-9) score was 0.     06/10/2022    8:12 AM  Depression screen PHQ 2/9  Decreased Interest 0  Down, Depressed, Hopeless 0  PHQ - 2 Score 0  Altered sleeping 0  Tired, decreased energy 0  Change in appetite 0  Feeling bad or failure about yourself  0  Trouble concentrating 0  Moving slowly or fidgety/restless 0  Suicidal thoughts 0  PHQ-9 Score 0  Difficult doing work/chores Not difficult at all   Subjective:   1. Other  fatigue Lynn Bond admits to daytime somnolence and denies waking up still tired. Patient has a history of symptoms of daytime fatigue. Lynn Bond generally gets 8 hours of sleep per night, and states that she has generally restful sleep. Snoring is present. Apneic episodes are not present. Epworth Sleepiness Score is 8.   2. SOBOE (shortness of breath on exertion) Lynn Bond Lose notes increasing shortness of breath with exercising and seems to be worsening over time with weight gain. She notes getting out of breath sooner with activity than she used to. This has not gotten worse recently. Lynn Bond denies shortness of breath at rest or orthopnea.  3. Essential hypertension Blood pressure borderline today.  On HCTZ 25 mg daily. Denies chest pain.   4. Osteoarthritis of right knee, unspecified osteoarthritis type Needing total knee replacement. Corticosteroid injections have not been helping.  Seeing Dr Darrelyn Hillock, able to walk at work.   Assessment/Plan:   1. Other fatigue Elianny does feel that her weight is causing her energy to be lower than it should be. Fatigue may be related to obesity, depression or many other causes. Labs will be ordered, and in the meanwhile, Leisa will focus on self care including making healthy food choices, increasing physical activity and focusing on stress reduction.  - EKG 12-Lead - VITAMIN D 25 Hydroxy (Vit-D Deficiency, Fractures) - TSH - T4, free - Lipid panel - Insulin, random - Hemoglobin A1c - Comprehensive metabolic panel - Vitamin B12 - CBC with Differential/Platelet  2.  SOBOE (shortness of breath on exertion) Aeriana does feel that she gets out of breath more easily that she used to when she exercises. Lynn Bond's shortness of breath appears to be obesity related and exercise induced. She has agreed to work on weight loss and gradually increase exercise to treat her exercise induced shortness of breath. Will continue to monitor closely.  3. Essential  hypertension Continue current blood pressure medications. Look for improvements with weight loss.   4. Osteoarthritis of right knee, unspecified osteoarthritis type Continue to work on pre-op BMI reduction.   5. Depression screening Lynn Bond had a negative depression screening. Depression is commonly associated with obesity and often results in emotional eating behaviors. We will monitor this closely and work on CBT to help improve the non-hunger eating patterns.   6. Obesity, current BMI 42.4 Lynn Bond is currently in the action stage of change and her goal is to continue with weight loss efforts. I recommend Lynn Bond begin the structured treatment plan as follows:  She has agreed to the Category 3 Plan.   Exercise goals:  As is.     Behavioral modification strategies: increasing lean protein intake, increasing vegetables, increasing water intake, decreasing liquid calories, decreasing eating out, no skipping meals, better snacking choices, and decreasing junk food.  She was informed of the importance of frequent follow-up visits to maximize her success with intensive lifestyle modifications for her multiple health conditions. She was informed we would discuss her lab results at her next visit unless there is a critical issue that needs to be addressed sooner. Lynn Bond agreed to keep her next visit at the agreed upon time to discuss these results.  Objective:   Blood pressure (!) 139/90, pulse 66, temperature 97.8 F (36.6 C), height 5\' 6"  (1.676 m), weight 262 lb (118.8 kg), SpO2 98 %. Body mass index is 42.29 kg/m.  EKG: Normal sinus rhythm, rate 70 BPM.  Indirect Calorimeter completed today shows a VO2 of 282 and a REE of 1944.  Her calculated basal metabolic rate is thus her basal metabolic rate is worse than expected.  General: Cooperative, alert, well developed, in no acute distress. HEENT: Conjunctivae and lids unremarkable. Cardiovascular: Regular rhythm.  Lungs: Normal work of  breathing. Neurologic: No focal deficits.   Lab Results  Component Value Date   CREATININE 0.81 06/10/2022   BUN 17 06/10/2022   NA 142 06/10/2022   K 3.8 06/10/2022   CL 101 06/10/2022   CO2 24 06/10/2022   Lab Results  Component Value Date   ALT 11 06/10/2022   AST 11 06/10/2022   ALKPHOS 71 06/10/2022   BILITOT 0.4 06/10/2022   Lab Results  Component Value Date   HGBA1C 5.8 (H) 06/10/2022   HGBA1C 5.4 07/24/2021   HGBA1C 6.0 (A) 10/13/2019   HGBA1C 5.6 12/13/2015   HGBA1C 5.8 01/28/2010   Lab Results  Component Value Date   INSULIN 6.2 06/10/2022   Lab Results  Component Value Date   TSH 1.290 06/10/2022   Lab Results  Component Value Date   CHOL 162 06/10/2022   HDL 60 06/10/2022   LDLCALC 92 06/10/2022   TRIG 49 06/10/2022   CHOLHDL 2.7 06/10/2022   Lab Results  Component Value Date   WBC 5.2 06/10/2022   HGB 11.4 06/10/2022   HCT 38.6 06/10/2022   MCV 79 06/10/2022   PLT 314 06/10/2022   Lab Results  Component Value Date   IRON 30 (L) 10/11/2012   TIBC 321 10/11/2012  FERRITIN 22 10/11/2012   Attestation Statements:   Reviewed by clinician on day of visit: allergies, medications, problem list, medical history, surgical history, family history, social history, and previous encounter notes.  I, Davy Pique, am acting as Location manager for Loyal Gambler, DO.  I have reviewed the above documentation for accuracy and completeness, and I agree with the above. Dell Ponto, DO

## 2022-06-24 ENCOUNTER — Ambulatory Visit (INDEPENDENT_AMBULATORY_CARE_PROVIDER_SITE_OTHER): Payer: Commercial Managed Care - HMO | Admitting: Family Medicine

## 2022-06-24 ENCOUNTER — Encounter (INDEPENDENT_AMBULATORY_CARE_PROVIDER_SITE_OTHER): Payer: Self-pay | Admitting: Family Medicine

## 2022-06-24 VITALS — BP 157/88 | HR 67 | Temp 98.0°F | Ht 66.0 in | Wt 262.0 lb

## 2022-06-24 DIAGNOSIS — M1711 Unilateral primary osteoarthritis, right knee: Secondary | ICD-10-CM | POA: Diagnosis not present

## 2022-06-24 DIAGNOSIS — E66813 Obesity, class 3: Secondary | ICD-10-CM

## 2022-06-24 DIAGNOSIS — R7303 Prediabetes: Secondary | ICD-10-CM

## 2022-06-24 DIAGNOSIS — I10 Essential (primary) hypertension: Secondary | ICD-10-CM | POA: Diagnosis not present

## 2022-06-24 DIAGNOSIS — Z6841 Body Mass Index (BMI) 40.0 and over, adult: Secondary | ICD-10-CM

## 2022-06-24 DIAGNOSIS — E669 Obesity, unspecified: Secondary | ICD-10-CM

## 2022-06-24 DIAGNOSIS — E559 Vitamin D deficiency, unspecified: Secondary | ICD-10-CM | POA: Diagnosis not present

## 2022-06-24 MED ORDER — TOPIRAMATE 25 MG PO TABS: 25.0000 mg | ORAL_TABLET | Freq: Every day | ORAL | 0 refills | Status: DC

## 2022-06-24 MED ORDER — AMLODIPINE BESYLATE 5 MG PO TABS: 5.0000 mg | ORAL_TABLET | Freq: Every day | ORAL | 0 refills | Status: DC

## 2022-06-24 MED ORDER — VITAMIN D (ERGOCALCIFEROL) 1.25 MG (50000 UNIT) PO CAPS: 50000.0000 [IU] | ORAL_CAPSULE | ORAL | 0 refills | Status: DC

## 2022-06-26 NOTE — Progress Notes (Signed)
Chief Complaint:   OBESITY Lynn Bond is here to discuss her progress with her obesity treatment plan along with follow-up of her obesity related diagnoses. Lynn Bond is on the Category 3 Plan and states she is following her eating plan approximately 40% of the time. Lynn Bond states she is not exercising.   Today's visit was #: 2 Starting weight: 262 lbs Starting date: 06/10/2022 Today's weight: 262 lbs Today's date: 06/24/2022 Total lbs lost to date: 0 Total lbs lost since last in-office visit: 0  Interim History: Working on getting in 3 meals per day.  Has not liked eggs.  Gets in fruits and veggies.  Getting 40 oz of Pepsi daily.  Getting adequately full with meals.  Eats donuts and rice crispy treats at work.  Has junk food snacks.  Denies night time snacks.  She denies cravings.  She brings her food to work.   Subjective:   1. Pre-diabetes Discussed labs with patient today. A1c 5.8, consumes excess sugar.  She denies family history of T2DM.  2. Vitamin D deficiency Discussed labs with patient today. Vitamin D level 19.8, complains of fatigue.   3. Essential hypertension Blood pressure elevated today and has run in the upper limit of normal for several visits. She is on HCTZ 25 mg daily. Denies chest pain.   4. Osteoarthritis of right knee, unspecified osteoarthritis type Knee pain Limits exercise.    Assessment/Plan:   1. Pre-diabetes Begin reducing high intake of regular soda, read labels for added sugar.   2. Vitamin D deficiency Recheck level in 3 months   Begin - Vitamin D, Ergocalciferol, (DRISDOL) 1.25 MG (50000 UNIT) CAPS capsule; Take 1 capsule (50,000 Units total) by mouth every 7 (seven) days.  Dispense: 5 capsule; Refill: 0  3. Essential hypertension Continue HCTZ 25 mg every morning.   Begin - amLODipine (NORVASC) 5 MG tablet; Take 1 tablet (5 mg total) by mouth daily.  Dispense: 30 tablet; Refill: 0  4. Osteoarthritis of right knee, unspecified  osteoarthritis type Needs BMI <40 for TKR, actively working on weight loss.   5. Obesity, current BMI 42.4 Begin - topiramate (TOPAMAX) 25 MG tablet; Take 1 tablet (25 mg total) by mouth daily.  Dispense: 30 tablet; Refill: 0  Lynn Bond is currently in the action stage of change. As such, her goal is to continue with weight loss efforts. She has agreed to the Category 3 Plan.   Exercise goals: All adults should avoid inactivity. Some physical activity is better than none, and adults who participate in any amount of physical activity gain some health benefits.  Behavioral modification strategies: increasing lean protein intake, increasing vegetables, increasing water intake, decreasing eating out, no skipping meals, keeping healthy foods in the home, emotional eating strategies, and decreasing junk food.  Lynn Bond has agreed to follow-up with our clinic in 2 weeks. She was informed of the importance of frequent follow-up visits to maximize her success with intensive lifestyle modifications for her multiple health conditions.   Objective:   Blood pressure (!) 157/88, pulse 67, temperature 98 F (36.7 C), height 5\' 6"  (1.676 m), weight 262 lb (118.8 kg), SpO2 100 %. Body mass index is 42.29 kg/m.  General: Cooperative, alert, well developed, in no acute distress. HEENT: Conjunctivae and lids unremarkable. Cardiovascular: Regular rhythm.  Lungs: Normal work of breathing. Neurologic: No focal deficits.   Lab Results  Component Value Date   CREATININE 0.81 06/10/2022   BUN 17 06/10/2022   NA 142 06/10/2022  K 3.8 06/10/2022   CL 101 06/10/2022   CO2 24 06/10/2022   Lab Results  Component Value Date   ALT 11 06/10/2022   AST 11 06/10/2022   ALKPHOS 71 06/10/2022   BILITOT 0.4 06/10/2022   Lab Results  Component Value Date   HGBA1C 5.8 (H) 06/10/2022   HGBA1C 5.4 07/24/2021   HGBA1C 6.0 (A) 10/13/2019   HGBA1C 5.6 12/13/2015   HGBA1C 5.8 01/28/2010   Lab Results  Component  Value Date   INSULIN 6.2 06/10/2022   Lab Results  Component Value Date   TSH 1.290 06/10/2022   Lab Results  Component Value Date   CHOL 162 06/10/2022   HDL 60 06/10/2022   LDLCALC 92 06/10/2022   TRIG 49 06/10/2022   CHOLHDL 2.7 06/10/2022   Lab Results  Component Value Date   VD25OH 19.8 (L) 06/10/2022   Lab Results  Component Value Date   WBC 5.2 06/10/2022   HGB 11.4 06/10/2022   HCT 38.6 06/10/2022   MCV 79 06/10/2022   PLT 314 06/10/2022   Lab Results  Component Value Date   IRON 30 (L) 10/11/2012   TIBC 321 10/11/2012   FERRITIN 22 10/11/2012   Attestation Statements:   Reviewed by clinician on day of visit: allergies, medications, problem list, medical history, surgical history, family history, social history, and previous encounter notes.  I, Davy Pique, am acting as Location manager for Loyal Gambler, DO.  I have reviewed the above documentation for accuracy and completeness, and I agree with the above. Dell Ponto, DO

## 2022-07-07 ENCOUNTER — Other Ambulatory Visit: Payer: Self-pay | Admitting: Student

## 2022-07-07 DIAGNOSIS — Z1231 Encounter for screening mammogram for malignant neoplasm of breast: Secondary | ICD-10-CM

## 2022-07-16 ENCOUNTER — Telehealth (INDEPENDENT_AMBULATORY_CARE_PROVIDER_SITE_OTHER): Payer: Commercial Managed Care - HMO | Admitting: Family Medicine

## 2022-07-16 ENCOUNTER — Encounter (INDEPENDENT_AMBULATORY_CARE_PROVIDER_SITE_OTHER): Payer: Self-pay | Admitting: Family Medicine

## 2022-07-16 VITALS — BP 123/84 | HR 70 | Temp 97.7°F | Ht 66.0 in | Wt 255.0 lb

## 2022-07-16 DIAGNOSIS — Z6841 Body Mass Index (BMI) 40.0 and over, adult: Secondary | ICD-10-CM | POA: Diagnosis not present

## 2022-07-16 DIAGNOSIS — I10 Essential (primary) hypertension: Secondary | ICD-10-CM | POA: Diagnosis not present

## 2022-07-16 DIAGNOSIS — E559 Vitamin D deficiency, unspecified: Secondary | ICD-10-CM | POA: Diagnosis not present

## 2022-07-16 DIAGNOSIS — E669 Obesity, unspecified: Secondary | ICD-10-CM

## 2022-07-16 MED ORDER — VITAMIN D (ERGOCALCIFEROL) 1.25 MG (50000 UNIT) PO CAPS: 50000.0000 [IU] | ORAL_CAPSULE | ORAL | 0 refills | Status: DC

## 2022-07-16 MED ORDER — AMLODIPINE BESYLATE 5 MG PO TABS: 5.0000 mg | ORAL_TABLET | Freq: Every day | ORAL | 0 refills | Status: DC

## 2022-07-16 MED ORDER — TOPIRAMATE 25 MG PO TABS: 25.0000 mg | ORAL_TABLET | Freq: Every day | ORAL | 0 refills | Status: DC

## 2022-08-05 ENCOUNTER — Ambulatory Visit
Admission: RE | Admit: 2022-08-05 | Discharge: 2022-08-05 | Disposition: A | Discharge status: Home / Self Care | Payer: Commercial Managed Care - HMO | Source: Ambulatory Visit | Attending: Hospitalist | Admitting: Hospitalist

## 2022-08-05 DIAGNOSIS — Z1231 Encounter for screening mammogram for malignant neoplasm of breast: Secondary | ICD-10-CM

## 2022-08-05 NOTE — Progress Notes (Signed)
Chief Complaint:   OBESITY Lynn Bond is here to discuss her progress with her obesity treatment plan along with follow-up of her obesity related diagnoses. Lynn Bond is on the Category 3 Plan and states she is following her eating plan approximately 40% of the time. Lynn Bond states she is not currently exercising.  Today's visit was #: 3 Starting weight: 262 lbs Starting date: 06/10/2022 Today's weight: 255 lbs Today's date: 07/16/2022 Total lbs lost to date: 7 Total lbs lost since last in-office visit: 7  Interim History: This is Lynn Bond's first OV with me. She started 3 new meds at her last OV and is tolerating them all well. She denies issues with meal plan.  Subjective:   1. Essential hypertension Dr. Valetta Close started pt on Norvasc last OV, in addition to her HCTZ due to BP 157/88. BP at home this morning was 134/75.  2. Vitamin D deficiency She is currently taking prescription vitamin D 50,000 IU each week. She denies nausea, vomiting or muscle weakness.  Assessment/Plan:  No orders of the defined types were placed in this encounter.   Medications Discontinued During This Encounter  Medication Reason   Vitamin D, Ergocalciferol, (DRISDOL) 1.25 MG (50000 UNIT) CAPS capsule Reorder   amLODipine (NORVASC) 5 MG tablet Reorder   topiramate (TOPAMAX) 25 MG tablet Reorder     Meds ordered this encounter  Medications   amLODipine (NORVASC) 5 MG tablet    Sig: Take 1 tablet (5 mg total) by mouth daily.    Dispense:  30 tablet    Refill:  0   DISCONTD: topiramate (TOPAMAX) 25 MG tablet    Sig: Take 1 tablet (25 mg total) by mouth daily.    Dispense:  30 tablet    Refill:  0   DISCONTD: Vitamin D, Ergocalciferol, (DRISDOL) 1.25 MG (50000 UNIT) CAPS capsule    Sig: Take 1 capsule (50,000 Units total) by mouth every 7 (seven) days.    Dispense:  5 capsule    Refill:  0     1. Essential hypertension Lynn Bond is working on healthy weight loss and exercise to improve blood pressure  control. We will watch for signs of hypotension as she continues her lifestyle modifications. Continue HCTZ and Norvasc.  Refill- amLODipine (NORVASC) 5 MG tablet; Take 1 tablet (5 mg total) by mouth daily.  Dispense: 30 tablet; Refill: 0  2. Vitamin D deficiency Low Vitamin D level contributes to fatigue and are associated with obesity, breast, and colon cancer. She agrees to continue to take prescription Vitamin D @50 ,000 IU every week and will follow-up for routine testing of Vitamin D, at least 2-3 times per year to avoid over-replacement.  Refill- Vitamin D, Ergocalciferol, (DRISDOL) 1.25 MG (50000 UNIT) CAPS capsule; Take 1 capsule (50,000 Units total) by mouth every 7 (seven) days.  Dispense: 5 capsule; Refill: 0  3. Obesity, current BMI 41.3 Lynn Bond is currently in the action stage of change. As such, her goal is to continue with weight loss efforts. She has agreed to the Category 3 Plan with breakfast options.   Lynn Bond started Topamax last OV and she is tolerating it well and notes it works great for cravings.  Refill- topiramate (TOPAMAX) 25 MG tablet; Take 1 tablet (25 mg total) by mouth daily.  Dispense: 30 tablet; Refill: 0  Exercise goals:  As is  Behavioral modification strategies: increasing lean protein intake, decreasing simple carbohydrates, meal planning and cooking strategies, and planning for success.  Lynn Bond has agreed to follow-up  with our clinic in 3 weeks. She was informed of the importance of frequent follow-up visits to maximize her success with intensive lifestyle modifications for her multiple health conditions.   Objective:   Blood pressure 123/84, pulse 70, temperature 97.7 F (36.5 C), height 5\' 6"  (1.676 m), weight 255 lb (115.7 kg), SpO2 100 %. Body mass index is 41.16 kg/m.  General: Cooperative, alert, well developed, in no acute distress. HEENT: Conjunctivae and lids unremarkable. Cardiovascular: Regular rhythm.  Lungs: Normal work of  breathing. Neurologic: No focal deficits.   Lab Results  Component Value Date   CREATININE 0.81 06/10/2022   BUN 17 06/10/2022   NA 142 06/10/2022   K 3.8 06/10/2022   CL 101 06/10/2022   CO2 24 06/10/2022   Lab Results  Component Value Date   ALT 11 06/10/2022   AST 11 06/10/2022   ALKPHOS 71 06/10/2022   BILITOT 0.4 06/10/2022   Lab Results  Component Value Date   HGBA1C 5.8 (H) 06/10/2022   HGBA1C 5.4 07/24/2021   HGBA1C 6.0 (A) 10/13/2019   HGBA1C 5.6 12/13/2015   HGBA1C 5.8 01/28/2010   Lab Results  Component Value Date   INSULIN 6.2 06/10/2022   Lab Results  Component Value Date   TSH 1.290 06/10/2022   Lab Results  Component Value Date   CHOL 162 06/10/2022   HDL 60 06/10/2022   LDLCALC 92 06/10/2022   TRIG 49 06/10/2022   CHOLHDL 2.7 06/10/2022   Lab Results  Component Value Date   VD25OH 19.8 (L) 06/10/2022   Lab Results  Component Value Date   WBC 5.2 06/10/2022   HGB 11.4 06/10/2022   HCT 38.6 06/10/2022   MCV 79 06/10/2022   PLT 314 06/10/2022   Lab Results  Component Value Date   IRON 30 (L) 10/11/2012   TIBC 321 10/11/2012   FERRITIN 22 10/11/2012    Attestation Statements:   Reviewed by clinician on day of visit: allergies, medications, problem list, medical history, surgical history, family history, social history, and previous encounter notes.  I, 10/13/2012, BS, CMA, am acting as transcriptionist for Kyung Rudd, DO.   I have reviewed the above documentation for accuracy and completeness, and I agree with the above. [employer], D.O.  The 21st Century Cures Act was signed into law in 2016 which includes the topic of electronic health records.  This provides immediate access to information in MyChart.  This includes consultation notes, operative notes, office notes, lab results and pathology reports.  If you have any questions about what you read please let 2017 know at your next visit so we can discuss your concerns  and take corrective action if need be.  We are right here with you.

## 2022-08-11 ENCOUNTER — Encounter (INDEPENDENT_AMBULATORY_CARE_PROVIDER_SITE_OTHER): Payer: Self-pay | Admitting: Family Medicine

## 2022-08-11 ENCOUNTER — Ambulatory Visit (INDEPENDENT_AMBULATORY_CARE_PROVIDER_SITE_OTHER): Payer: Commercial Managed Care - HMO | Admitting: Family Medicine

## 2022-08-11 VITALS — BP 116/79 | HR 68 | Temp 98.4°F | Ht 66.0 in | Wt 257.6 lb

## 2022-08-11 DIAGNOSIS — R7303 Prediabetes: Secondary | ICD-10-CM | POA: Diagnosis not present

## 2022-08-11 DIAGNOSIS — E669 Obesity, unspecified: Secondary | ICD-10-CM | POA: Diagnosis not present

## 2022-08-11 DIAGNOSIS — Z6841 Body Mass Index (BMI) 40.0 and over, adult: Secondary | ICD-10-CM | POA: Diagnosis not present

## 2022-08-11 DIAGNOSIS — E559 Vitamin D deficiency, unspecified: Secondary | ICD-10-CM

## 2022-08-11 MED ORDER — TOPIRAMATE 25 MG PO TABS: 25.0000 mg | ORAL_TABLET | Freq: Every day | ORAL | 0 refills | Status: DC

## 2022-08-11 MED ORDER — VITAMIN D (ERGOCALCIFEROL) 1.25 MG (50000 UNIT) PO CAPS: 50000.0000 [IU] | ORAL_CAPSULE | ORAL | 0 refills | Status: DC

## 2022-08-22 NOTE — Progress Notes (Signed)
Chief Complaint:   OBESITY Lynn Bond is here to discuss her progress with her obesity treatment plan along with follow-up of her obesity related diagnoses. Lynn Bond is on the Category 3 Plan and states she is following her eating plan approximately 2% of the time. Lynn Bond states she is not exercising.   Today's visit was #: 4 Starting weight: 262 lbs Starting date: 06/10/2022 Today's weight: 257 lbs Today's date: 08/11/2022 Total lbs lost to date: 5 lbs Total lbs lost since last in-office visit: +2 lbs  Interim History: "I went to 3 weddings and ate cake and food, ETOH beverages and it was worth it".  She snacks on "healthy beverages", ie, smoothie king.   Subjective:   1. Pre-diabetes- with cravings Helping with cravings, no side effects, tolerating well.    2. Vitamin D deficiency No side effects.  Compliance good weekly.  She is tolerating well.   Assessment/Plan:  No orders of the defined types were placed in this encounter.   Medications Discontinued During This Encounter  Medication Reason   topiramate (TOPAMAX) 25 MG tablet Reorder   Vitamin D, Ergocalciferol, (DRISDOL) 1.25 MG (50000 UNIT) CAPS capsule Reorder     Meds ordered this encounter  Medications   DISCONTD: topiramate (TOPAMAX) 25 MG tablet    Sig: Take 1 tablet (25 mg total) by mouth daily.    Dispense:  30 tablet    Refill:  0   Vitamin D, Ergocalciferol, (DRISDOL) 1.25 MG (50000 UNIT) CAPS capsule    Sig: Take 1 capsule (50,000 Units total) by mouth every 7 (seven) days.    Dispense:  5 capsule    Refill:  0     1. Pre-diabetes- with cravings Refill - topiramate (TOPAMAX) 25 MG tablet; Take 1 tablet (25 mg total) by mouth daily.  Dispense: 30 tablet; Refill: 0  2. Vitamin D deficiency Low Vitamin D level contributes to fatigue and are associated with obesity, breast, and colon cancer. She agrees to continue to take prescription Vitamin D @50 ,000 IU every week and will follow-up for routine  testing of Vitamin D, at least 2-3 times per year to avoid over-replacement.  Refill - Vitamin D, Ergocalciferol, (DRISDOL) 1.25 MG (50000 UNIT) CAPS capsule; Take 1 capsule (50,000 Units total) by mouth every 7 (seven) days.  Dispense: 5 capsule; Refill: 0  3. Obesity, with cravings current BMI 41.3 Focus on  looking up nutritional values of healthy snacks, ie, smoothie king, strawberry with 890 caloreis, 34 grams of fat, 146 carbs, 131 grams of sugar, 25 grams of protein.   Lynn Bond is currently in the action stage of change. As such, her goal is to continue with weight loss efforts. She has agreed to the Category 3 Plan.   Exercise goals:  As is.   Behavioral modification strategies: increasing lean protein intake and decreasing simple carbohydrates.  Lynn Bond has agreed to follow-up with our clinic in 3 weeks. She was informed of the importance of frequent follow-up visits to maximize her success with intensive lifestyle modifications for her multiple health conditions.   Objective:   Blood pressure 116/79, pulse 68, temperature 98.4 F (36.9 C), height 5\' 6"  (1.676 m), weight 257 lb 9.6 oz (116.8 kg), SpO2 99 %. Body mass index is 41.58 kg/m.  General: Cooperative, alert, well developed, in no acute distress. HEENT: Conjunctivae and lids unremarkable. Cardiovascular: Regular rhythm.  Lungs: Normal work of breathing. Neurologic: No focal deficits.   Lab Results  Component Value Date   CREATININE  0.81 06/10/2022   BUN 17 06/10/2022   NA 142 06/10/2022   K 3.8 06/10/2022   CL 101 06/10/2022   CO2 24 06/10/2022   Lab Results  Component Value Date   ALT 11 06/10/2022   AST 11 06/10/2022   ALKPHOS 71 06/10/2022   BILITOT 0.4 06/10/2022   Lab Results  Component Value Date   HGBA1C 5.8 (H) 06/10/2022   HGBA1C 5.4 07/24/2021   HGBA1C 6.0 (A) 10/13/2019   HGBA1C 5.6 12/13/2015   HGBA1C 5.8 01/28/2010   Lab Results  Component Value Date   INSULIN 6.2 06/10/2022   Lab  Results  Component Value Date   TSH 1.290 06/10/2022   Lab Results  Component Value Date   CHOL 162 06/10/2022   HDL 60 06/10/2022   LDLCALC 92 06/10/2022   TRIG 49 06/10/2022   CHOLHDL 2.7 06/10/2022   Lab Results  Component Value Date   VD25OH 19.8 (L) 06/10/2022   Lab Results  Component Value Date   WBC 5.2 06/10/2022   HGB 11.4 06/10/2022   HCT 38.6 06/10/2022   MCV 79 06/10/2022   PLT 314 06/10/2022   Lab Results  Component Value Date   IRON 30 (L) 10/11/2012   TIBC 321 10/11/2012   FERRITIN 22 10/11/2012    Attestation Statements:   Reviewed by clinician on day of visit: allergies, medications, problem list, medical history, surgical history, family history, social history, and previous encounter notes.  I, Malcolm Metro, RMA, am acting as Energy manager for [employer], DO.   I have reviewed the above documentation for accuracy and completeness, and I agree with the above. Carlye Grippe, D.O.  The 21st Century Cures Act was signed into law in 2016 which includes the topic of electronic health records.  This provides immediate access to information in MyChart.  This includes consultation notes, operative notes, office notes, lab results and pathology reports.  If you have any questions about what you read please let us know at your next visit so we can discuss your concerns and take corrective action if need be.  We are right here with you.

## 2022-08-26 ENCOUNTER — Telehealth (INDEPENDENT_AMBULATORY_CARE_PROVIDER_SITE_OTHER): Payer: Self-pay | Admitting: Family Medicine

## 2022-08-26 ENCOUNTER — Other Ambulatory Visit (INDEPENDENT_AMBULATORY_CARE_PROVIDER_SITE_OTHER): Payer: Self-pay

## 2022-08-26 ENCOUNTER — Encounter: Payer: Commercial Managed Care - HMO | Admitting: Student

## 2022-08-26 DIAGNOSIS — I10 Essential (primary) hypertension: Secondary | ICD-10-CM

## 2022-08-26 DIAGNOSIS — R7303 Prediabetes: Secondary | ICD-10-CM

## 2022-08-26 MED ORDER — AMLODIPINE BESYLATE 5 MG PO TABS: 5.0000 mg | ORAL_TABLET | Freq: Every day | ORAL | 0 refills | Status: DC

## 2022-08-26 MED ORDER — TOPIRAMATE 25 MG PO TABS: 25.0000 mg | ORAL_TABLET | Freq: Every day | ORAL | 0 refills | Status: DC

## 2022-08-26 NOTE — Telephone Encounter (Signed)
Refills ordered, pending Dr. Synthia Innocent signature.

## 2022-08-26 NOTE — Telephone Encounter (Signed)
Pt called 11/28 was here 11/13 states Dr said she would called in her refill for Amlodipine and Topiramate.She went to the pharmacy there was no refills. Would like for refills on meds to be called in to [facility] at [facility]

## 2022-09-01 ENCOUNTER — Ambulatory Visit (INDEPENDENT_AMBULATORY_CARE_PROVIDER_SITE_OTHER): Payer: Commercial Managed Care - HMO | Admitting: Physician Assistant

## 2022-09-01 ENCOUNTER — Ambulatory Visit (INDEPENDENT_AMBULATORY_CARE_PROVIDER_SITE_OTHER): Payer: Commercial Managed Care - HMO | Admitting: Family Medicine

## 2022-09-01 ENCOUNTER — Encounter (INDEPENDENT_AMBULATORY_CARE_PROVIDER_SITE_OTHER): Payer: Self-pay | Admitting: Physician Assistant

## 2022-09-01 VITALS — BP 126/86 | HR 73 | Temp 98.2°F | Ht 66.0 in | Wt 253.0 lb

## 2022-09-01 DIAGNOSIS — E559 Vitamin D deficiency, unspecified: Secondary | ICD-10-CM

## 2022-09-01 DIAGNOSIS — E669 Obesity, unspecified: Secondary | ICD-10-CM

## 2022-09-01 DIAGNOSIS — M1711 Unilateral primary osteoarthritis, right knee: Secondary | ICD-10-CM | POA: Diagnosis not present

## 2022-09-01 DIAGNOSIS — Z6841 Body Mass Index (BMI) 40.0 and over, adult: Secondary | ICD-10-CM

## 2022-09-03 ENCOUNTER — Encounter: Payer: Self-pay | Admitting: Student

## 2022-09-03 ENCOUNTER — Ambulatory Visit (INDEPENDENT_AMBULATORY_CARE_PROVIDER_SITE_OTHER): Payer: Commercial Managed Care - HMO | Admitting: Student

## 2022-09-03 VITALS — BP 138/96 | HR 63 | Temp 97.8°F | Ht 66.0 in | Wt 258.2 lb

## 2022-09-03 DIAGNOSIS — Z6841 Body Mass Index (BMI) 40.0 and over, adult: Secondary | ICD-10-CM

## 2022-09-03 DIAGNOSIS — Z87891 Personal history of nicotine dependence: Secondary | ICD-10-CM

## 2022-09-03 DIAGNOSIS — M170 Bilateral primary osteoarthritis of knee: Secondary | ICD-10-CM

## 2022-09-03 DIAGNOSIS — Z1211 Encounter for screening for malignant neoplasm of colon: Secondary | ICD-10-CM | POA: Diagnosis not present

## 2022-09-03 DIAGNOSIS — I10 Essential (primary) hypertension: Secondary | ICD-10-CM

## 2022-09-03 DIAGNOSIS — Z0000 Encounter for general adult medical examination without abnormal findings: Secondary | ICD-10-CM

## 2022-09-03 MED ORDER — AMLODIPINE BESYLATE 10 MG PO TABS: 10.0000 mg | ORAL_TABLET | Freq: Every day | ORAL | 11 refills | Status: DC

## 2022-09-03 NOTE — Progress Notes (Signed)
CC: Follow-up on blood pressure and weight loss  HPI:  Lynn Bond is a 56 y.o. with past medical history of hypertension, asthma, obesity who presents to the clinic today for blood pressure recheck and weight loss.  Please see problem based charting for detail  Past Medical History:  Diagnosis Date   Asthma    ECZEMA 03/09/2009   Qualifier: Diagnosis of  By: Andrey Campanile  MD, Valerie     GERD 03/09/2009   Qualifier: Diagnosis of  By: Andrey Campanile  MD, Valerie     Hypertension    Review of Systems:  per HPI  Physical Exam:  Vitals:   09/03/22 1522 09/03/22 1609  BP: (!) 144/94 (!) 138/96  Pulse: 70 63  Temp: 97.8 F (36.6 C)   TempSrc: Oral   SpO2: 98%   Weight: 258 lb 3.2 oz (117.1 kg)   Height: 5\' 6"  (1.676 m)    Physical Exam Constitutional:      General: She is not in acute distress.    Appearance: She is not ill-appearing.  HENT:     Head: Normocephalic.  Eyes:     General:        Right eye: No discharge.        Left eye: No discharge.     Conjunctiva/sclera: Conjunctivae normal.  Cardiovascular:     Rate and Rhythm: Normal rate and regular rhythm.  Pulmonary:     Effort: Pulmonary effort is normal. No respiratory distress.     Breath sounds: Normal breath sounds. No wheezing.  Musculoskeletal:     Cervical back: Normal range of motion.  Neurological:     General: No focal deficit present.     Mental Status: She is alert.  Psychiatric:        Mood and Affect: Mood normal.      Assessment & Plan:   See Encounters Tab for problem based charting.  Essential hypertension, benign Her blood pressure is mildly elevated today at 138/96.  Patient report adherence to her medications.  She currently denies any pain of her OA when checking blood pressure.  -Increase Amlodipine from 5 mg to 10 mg  -Continue HCTZ 25 mg  -CMP in 9/23 is good  Obesity, unspecified Patient is doing well on her weight loss progress.  She has lost more than 20 pounds since  February 2023.  Patient is following at weight loss clinic and is taking Topamax.  We attempted to start patient on Wegovy but it was not covered without a diagnosis of diabetes.  -I congratulated patient and encouraged her to keep up the good progress. -Our goal is to try to have her BMI less than 40 for possible knee replacement surgery.  Healthcare maintenance -Patient has had flu vaccine and shingles vaccine last month at [facility] pharmacy -She is due for colonoscopy.  Referral placed last year but she had COVID and did not follow through.  I placed another referral today -Patient is due for Pap smear.  She would like to have a female provider at next visit   Osteoarthritis of both knees This is a chronic issue.  She has followed up with orthopedic and has had steroid injection without much relief.  Patient did not qualify for knee replacement surgery because her BMI was 47 at that time.  We are working on her weight loss to have a BMI at least less than 40 and follow-up with orthopedic.  Patient is currently taking Tylenol for pain.   Patient discussed  with Dr. Jimmye Norman

## 2022-09-03 NOTE — Assessment & Plan Note (Signed)
-  Patient has had flu vaccine and shingles vaccine last month at [facility] pharmacy -She is due for colonoscopy.  Referral placed last year but she had COVID and did not follow through.  I placed another referral today -Patient is due for Pap smear.  She would like to have a female provider at next visit

## 2022-09-03 NOTE — Patient Instructions (Addendum)
Lynn Bond,  It was nice seeing you in the clinic today.  Here summary what we talked about  1.  Your blood pressure is slightly high today 138/96.  I increase your amlodipine from 5 to 10 mg daily.  Please continue HCTZ 25 mg daily.  2.  I am glad you are doing well with your weight loss.  We will try to get your BMI less than 40 so that the orthopedics can consider knee replacement surgery.  3.  I placed a referral for colonoscopy.  Please wait for the call.  Please return in 3 months for blood pressure recheck  Dr. Cyndie Chime

## 2022-09-03 NOTE — Assessment & Plan Note (Signed)
Her blood pressure is mildly elevated today at 138/96.  Patient report adherence to her medications.  She currently denies any pain of her OA when checking blood pressure.  -Increase Amlodipine from 5 mg to 10 mg  -Continue HCTZ 25 mg  -CMP in 9/23 is good

## 2022-09-03 NOTE — Assessment & Plan Note (Signed)
This is a chronic issue.  She has followed up with orthopedic and has had steroid injection without much relief.  Patient did not qualify for knee replacement surgery because her BMI was 47 at that time.  We are working on her weight loss to have a BMI at least less than 40 and follow-up with orthopedic.  Patient is currently taking Tylenol for pain.

## 2022-09-03 NOTE — Assessment & Plan Note (Signed)
Patient is doing well on her weight loss progress.  She has lost more than 20 pounds since February 2023.  Patient is following at weight loss clinic and is taking Topamax.  We attempted to start patient on Wegovy but it was not covered without a diagnosis of diabetes.  -I congratulated patient and encouraged her to keep up the good progress. -Our goal is to try to have her BMI less than 40 for possible knee replacement surgery.

## 2022-09-09 NOTE — Progress Notes (Signed)
Internal Medicine Clinic Attending  Case discussed with Dr. Nooruddin  At the time of the visit.  We reviewed the resident's history and exam and pertinent patient test results.  I agree with the assessment, diagnosis, and plan of care documented in the resident's note.  

## 2022-09-15 NOTE — Progress Notes (Unsigned)
Chief Complaint:   OBESITY Lynn Bond is here to discuss her progress with her obesity treatment plan along with follow-up of her obesity related diagnoses. Lynn Bond is on the Category 3 Plan and states she is following her eating plan approximately ?% of the time. Lynn Bond states she is exercising 0 minutes 0 times per week.  Today's visit was #: 5 Starting weight: 262 lbs Starting date: 06/10/2022 Today's weight: 253 lbs Today's date: 09/01/2022 Total lbs lost to date: 9 lbs Total lbs lost since last in-office visit: 4  Interim History: Lynn Bond has done well with weight loss.  Did very well at Thanksgiving and did well getting in protein.  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  Subjective:   1. Vitamin D deficiency Lynn Bond is taking vitamin D prescription with no side effects.  2. Osteoarthritis of right knee, unspecified osteoarthritis type Lynn Bond is working on healthier eating to try to promote weight loss--plans to have right TKA once qualifies for surgery next year. Assessment/Plan:   1. Vitamin D deficiency Continue taking prescription vitamin D.  2. Osteoarthritis of right knee, unspecified osteoarthritis type Will follow-up with Ortho as directed, once BMI is less than 40.  Continue healthy eating to promote weight loss and improve health.  3. Obesity, with cravings current BMI 40.9 Lynn Bond is currently in the action stage of change. As such, her goal is to continue with weight loss efforts. She has agreed to the Category 3 Plan.   Exercise goals: All adults should avoid inactivity. Some physical activity is better than none, and adults who participate in any amount of physical activity gain some health benefits.  Behavioral modification strategies: increasing lean protein intake, decreasing simple carbohydrates, meal planning and cooking strategies, and holiday eating strategies .  Lynn Bond has agreed to  follow-up with our clinic in 4 weeks. She was informed of the importance of frequent follow-up visits to maximize her success with intensive lifestyle modifications for her multiple health conditions.   Objective:   Blood pressure 126/86, pulse 73, temperature 98.2 F (36.8 C), height 5\' 6"  (1.676 m), weight 253 lb (114.8 kg), SpO2 100 %. Body mass index is 40.84 kg/m.  General: Cooperative, alert, well developed, in no acute distress. HEENT: Conjunctivae and lids unremarkable. Cardiovascular: Regular rhythm.  Lungs: Normal work of breathing. Neurologic: No focal deficits.   Lab Results  Component Value Date   CREATININE 0.81 06/10/2022   BUN 17 06/10/2022   NA 142 06/10/2022   K 3.8 06/10/2022   CL 101 06/10/2022   CO2 24 06/10/2022   Lab Results  Component Value Date   ALT 11 06/10/2022   AST 11 06/10/2022   ALKPHOS 71 06/10/2022   BILITOT 0.4 06/10/2022   Lab Results  Component Value Date   HGBA1C 5.8 (H) 06/10/2022   HGBA1C 5.4 07/24/2021   HGBA1C 6.0 (A) 10/13/2019   HGBA1C 5.6 12/13/2015   HGBA1C 5.8 01/28/2010   Lab Results  Component Value Date   INSULIN 6.2 06/10/2022   Lab Results  Component Value Date   TSH 1.290 06/10/2022   Lab Results  Component Value Date   CHOL 162 06/10/2022   HDL 60 06/10/2022   LDLCALC 92 06/10/2022   TRIG 49 06/10/2022   CHOLHDL 2.7 06/10/2022   Lab Results  Component Value Date   VD25OH 19.8 (L) 06/10/2022   Lab Results  Component Value Date   WBC  5.2 06/10/2022   HGB 11.4 06/10/2022   HCT 38.6 06/10/2022   MCV 79 06/10/2022   PLT 314 06/10/2022   Lab Results  Component Value Date   IRON 30 (L) 10/11/2012   TIBC 321 10/11/2012   FERRITIN 22 10/11/2012   Attestation Statements:   Reviewed by clinician on day of visit: allergies, medications, problem list, medical history, surgical history, family history, social history, and previous encounter notes.  I, Brendell Tyus, am acting as transcriptionist for  [employer], PA.  I have reviewed the above documentation for accuracy and completeness, and I agree with the above. -  ***

## 2022-10-06 ENCOUNTER — Ambulatory Visit (INDEPENDENT_AMBULATORY_CARE_PROVIDER_SITE_OTHER): Payer: Self-pay | Admitting: Family Medicine

## 2022-10-06 ENCOUNTER — Encounter (INDEPENDENT_AMBULATORY_CARE_PROVIDER_SITE_OTHER): Payer: Self-pay | Admitting: Family Medicine

## 2022-10-06 VITALS — BP 138/86 | HR 56 | Temp 98.5°F | Ht 66.0 in | Wt 247.0 lb

## 2022-10-06 DIAGNOSIS — R7303 Prediabetes: Secondary | ICD-10-CM

## 2022-10-06 DIAGNOSIS — I10 Essential (primary) hypertension: Secondary | ICD-10-CM

## 2022-10-06 DIAGNOSIS — E559 Vitamin D deficiency, unspecified: Secondary | ICD-10-CM

## 2022-10-06 DIAGNOSIS — Z6839 Body mass index (BMI) 39.0-39.9, adult: Secondary | ICD-10-CM

## 2022-10-06 DIAGNOSIS — E669 Obesity, unspecified: Secondary | ICD-10-CM

## 2022-10-06 MED ORDER — TOPIRAMATE 50 MG PO TABS: ORAL_TABLET | ORAL | 0 refills | Status: DC

## 2022-10-06 MED ORDER — VITAMIN D (ERGOCALCIFEROL) 1.25 MG (50000 UNIT) PO CAPS: 50000.0000 [IU] | ORAL_CAPSULE | ORAL | 0 refills | Status: DC

## 2022-10-07 LAB — VITAMIN D 25 HYDROXY (VIT D DEFICIENCY, FRACTURES): Vit D, 25-Hydroxy: 52.8 ng/mL (ref 30.0–100.0)

## 2022-10-07 LAB — HEMOGLOBIN A1C
Est. average glucose Bld gHb Est-mCnc: 120 mg/dL
Hgb A1c MFr Bld: 5.8 % — ABNORMAL HIGH (ref 4.8–5.6)

## 2022-10-21 NOTE — Progress Notes (Signed)
Chief Complaint:   OBESITY Lynn Bond is here to discuss her progress with her obesity treatment plan along with follow-up of her obesity related diagnoses. Lynn Bond is on the Category 3 Plan and states she is following her eating plan approximately 90% of the time. Lynn Bond states she is exercising.  Today's visit was #: 6 Starting weight: 31 LBS Starting date: 06/10/2022 Today's weight: 247 LBS Today's date: 10/06/2022 Total lbs lost to date: 15 LBS Total lbs lost since last in-office visit: 6 LBS  Interim History: Patient used [employer] and really state on plan.  She has brought her healthy foods with family member how to make it for the holidays.  She has lost 7 LBS and fat mass and gained almost 1 LB in muscle mass.  Subjective:   1. Essential hypertension PCP increased Norvasc from 5 mg to 10 mg from 09/03/2022.  Patient is doing better with blood pressure.  She decreased her salt intake and increased her water intake.  2. Pre-diabetes- with cravings Cravings are much better controlled on Topamax.  Patient is tolerating medication well.  Patient started medication at the end of October 2023.  Patient desires increased dose as needed.  3. Vitamin D deficiency Lynn Bond is tolerating medication(s) well without side effects.  Medication compliance is good as patient endorses taking it as prescribed.  Symptoms are stable and the patient denies additional concerns regarding this condition.    Assessment/Plan:   Orders Placed This Encounter  Procedures   VITAMIN D 25 Hydroxy (Vit-D Deficiency, Fractures)   Hemoglobin A1c    Medications Discontinued During This Encounter  Medication Reason   Vitamin D, Ergocalciferol, (DRISDOL) 1.25 MG (50000 UNIT) CAPS capsule Reorder   topiramate (TOPAMAX) 25 MG tablet Reorder     Meds ordered this encounter  Medications   DISCONTD: topiramate (TOPAMAX) 50 MG tablet    Sig: 25-50 mg daily    Dispense:  30 tablet    Refill:  0    DISCONTD: Vitamin D, Ergocalciferol, (DRISDOL) 1.25 MG (50000 UNIT) CAPS capsule    Sig: Take 1 capsule (50,000 Units total) by mouth every 7 (seven) days.    Dispense:  5 capsule    Refill:  0     1. Essential hypertension Continue medications.  Watch for increased ankle swelling.  Blood pressure is at goal of 130/80 or less.  Continue with PNP and weight loss.  2. Pre-diabetes- with cravings Check labs today.  - Hemoglobin A1c  Refill- topiramate (TOPAMAX) 50 MG tablet; 25-50 mg daily  Dispense: 30 tablet; Refill: 0  3. Vitamin D deficiency Low Vitamin D level contributes to fatigue and are associated with obesity, breast, and colon cancer. She agrees to continue to take prescription Vitamin D @50 ,000 IU every week and will follow-up for routine testing of Vitamin D, at least 2-3 times per year to avoid over-replacement.  Refill - Vitamin D, Ergocalciferol, (DRISDOL) 1.25 MG (50000 UNIT) CAPS capsule; Take 1 capsule (50,000 Units total) by mouth every 7 (seven) days.  Dispense: 5 capsule; Refill: 0  Check labs today.   - VITAMIN D 25 Hydroxy (Vit-D Deficiency, Fractures)  4. Obesity with current BMI 39.9 Lynn Bond is currently in the action stage of change. As such, her goal is to continue with weight loss efforts. She has agreed to the Category 3 Plan with breakfast and lunch options..   Exercise goals: All adults should avoid inactivity. Some physical activity is better than none, and adults who  participate in any amount of physical activity gain some health benefits.  Try walking 5 to 10 minutes daily.  Behavioral modification strategies: increasing lean protein intake and planning for success.  Lynn Bond has agreed to follow-up with our clinic in 3-4 weeks. She was informed of the importance of frequent follow-up visits to maximize her success with intensive lifestyle modifications for her multiple health conditions.   Lynn Bond was informed we would discuss her lab results at her next  visit unless there is a critical issue that needs to be addressed sooner. Lynn Bond agreed to keep her next visit at the agreed upon time to discuss these results.  Objective:   Blood pressure 138/86, pulse (!) 56, temperature 98.5 F (36.9 C), height 5\' 6"  (1.676 m), weight 247 lb (112 kg), SpO2 97 %. Body mass index is 39.87 kg/m.  General: Cooperative, alert, well developed, in no acute distress. HEENT: Conjunctivae and lids unremarkable. Cardiovascular: Regular rhythm.  Lungs: Normal work of breathing. Neurologic: No focal deficits.   Lab Results  Component Value Date   CREATININE 0.81 06/10/2022   BUN 17 06/10/2022   NA 142 06/10/2022   K 3.8 06/10/2022   CL 101 06/10/2022   CO2 24 06/10/2022   Lab Results  Component Value Date   ALT 11 06/10/2022   AST 11 06/10/2022   ALKPHOS 71 06/10/2022   BILITOT 0.4 06/10/2022   Lab Results  Component Value Date   HGBA1C 5.8 (H) 10/06/2022   HGBA1C 5.8 (H) 06/10/2022   HGBA1C 5.4 07/24/2021   HGBA1C 6.0 (A) 10/13/2019   HGBA1C 5.6 12/13/2015   Lab Results  Component Value Date   INSULIN 6.2 06/10/2022   Lab Results  Component Value Date   TSH 1.290 06/10/2022   Lab Results  Component Value Date   CHOL 162 06/10/2022   HDL 60 06/10/2022   LDLCALC 92 06/10/2022   TRIG 49 06/10/2022   CHOLHDL 2.7 06/10/2022   Lab Results  Component Value Date   VD25OH 52.8 10/06/2022   VD25OH 19.8 (L) 06/10/2022   Lab Results  Component Value Date   WBC 5.2 06/10/2022   HGB 11.4 06/10/2022   HCT 38.6 06/10/2022   MCV 79 06/10/2022   PLT 314 06/10/2022   Lab Results  Component Value Date   IRON 30 (L) 10/11/2012   TIBC 321 10/11/2012   FERRITIN 22 10/11/2012   Attestation Statements:   Reviewed by clinician on day of visit: allergies, medications, problem list, medical history, surgical history, family history, social history, and previous encounter notes.  I, Davy Pique, RMA, am acting as Location manager for [employer], DO.  I have reviewed the above documentation for accuracy and completeness, and I agree with the above. Marjory Sneddon, D.O.  The [place] was signed into law in 2016 which includes the topic of electronic health records.  This provides immediate access to information in MyChart.  This includes consultation notes, operative notes, office notes, lab results and pathology reports.  If you have any questions about what you read please let us know at your next visit so we can discuss your concerns and take corrective action if need be.  We are right here with you.

## 2022-10-27 ENCOUNTER — Ambulatory Visit (INDEPENDENT_AMBULATORY_CARE_PROVIDER_SITE_OTHER): Payer: PRIVATE HEALTH INSURANCE | Admitting: Family Medicine

## 2022-10-27 ENCOUNTER — Encounter (INDEPENDENT_AMBULATORY_CARE_PROVIDER_SITE_OTHER): Payer: Self-pay | Admitting: Family Medicine

## 2022-10-27 VITALS — BP 133/83 | HR 69 | Temp 98.4°F | Ht 66.0 in | Wt 248.2 lb

## 2022-10-27 DIAGNOSIS — J3089 Other allergic rhinitis: Secondary | ICD-10-CM | POA: Diagnosis not present

## 2022-10-27 DIAGNOSIS — I10 Essential (primary) hypertension: Secondary | ICD-10-CM

## 2022-10-27 DIAGNOSIS — E559 Vitamin D deficiency, unspecified: Secondary | ICD-10-CM | POA: Diagnosis not present

## 2022-10-27 DIAGNOSIS — R7303 Prediabetes: Secondary | ICD-10-CM

## 2022-10-27 DIAGNOSIS — Z6841 Body Mass Index (BMI) 40.0 and over, adult: Secondary | ICD-10-CM

## 2022-10-27 MED ORDER — VITAMIN D (ERGOCALCIFEROL) 1.25 MG (50000 UNIT) PO CAPS: 50000.0000 [IU] | ORAL_CAPSULE | ORAL | 0 refills | Status: DC

## 2022-10-27 MED ORDER — TOPIRAMATE 50 MG PO TABS: 50.0000 mg | ORAL_TABLET | Freq: Two times a day (BID) | ORAL | 0 refills | Status: DC

## 2022-10-29 DIAGNOSIS — I10 Essential (primary) hypertension: Secondary | ICD-10-CM | POA: Insufficient documentation

## 2022-10-29 DIAGNOSIS — E669 Obesity, unspecified: Secondary | ICD-10-CM | POA: Insufficient documentation

## 2022-10-29 DIAGNOSIS — J3089 Other allergic rhinitis: Secondary | ICD-10-CM | POA: Insufficient documentation

## 2022-11-05 ENCOUNTER — Other Ambulatory Visit: Payer: Self-pay

## 2022-11-05 ENCOUNTER — Encounter: Payer: Self-pay | Admitting: Student

## 2022-11-05 ENCOUNTER — Ambulatory Visit (INDEPENDENT_AMBULATORY_CARE_PROVIDER_SITE_OTHER): Payer: PRIVATE HEALTH INSURANCE | Admitting: Student

## 2022-11-05 VITALS — BP 117/76 | HR 81 | Temp 98.1°F | Ht 67.0 in | Wt 243.3 lb

## 2022-11-05 DIAGNOSIS — Z6838 Body mass index (BMI) 38.0-38.9, adult: Secondary | ICD-10-CM

## 2022-11-05 DIAGNOSIS — I10 Essential (primary) hypertension: Secondary | ICD-10-CM

## 2022-11-05 DIAGNOSIS — M170 Bilateral primary osteoarthritis of knee: Secondary | ICD-10-CM | POA: Diagnosis not present

## 2022-11-05 DIAGNOSIS — Z87891 Personal history of nicotine dependence: Secondary | ICD-10-CM

## 2022-11-05 MED ORDER — DICLOFENAC SODIUM 1 % EX GEL: 2.0000 g | Freq: Four times a day (QID) | CUTANEOUS | 2 refills | Status: DC

## 2022-11-05 NOTE — Patient Instructions (Addendum)
Thank you, Ms.Kittie Plater for allowing [place] to provide your care today. Today we discussed your knee pain and weight loss.  Graduations and losing 50 pounds since your last office visit.  For your osteoarthritis, I will keep you out of work for the next 4 weeks with plan to follow-up for reevaluation.   I have ordered the following medication/changed the following medications:  Start applying Voltaren gel 4 times daily to the knee Trial over-the-counter ibuprofen as needed for pain  My Chart Access: [email or web address]  Please follow-up in 1 month  Please make sure to arrive 15 minutes prior to your next appointment. If you arrive late, you may be asked to reschedule.    We look forward to seeing you next time. Please call our clinic at [phone] if you have any questions or concerns. The best time to call is Monday-Friday from 9am-4pm, but there is someone available 24/7. If after hours or the weekend, call the main hospital number and ask for the Internal Medicine Resident On-Call. If you need medication refills, please notify your pharmacy one week in advance and they will send [place] a request.   Thank you for letting us take part in your care. Wishing you the best!  Lacinda Axon, MD 11/05/2022, 3:43 PM IM Resident, PGY-3 [facility]ian 41:10

## 2022-11-05 NOTE — Progress Notes (Unsigned)
CC: Knee pain  HPI:  [name] is a 57 y.o. female with PMH as below who presents to clinic for follow-up on her knee osteoarthritis. Please see problem based charting for evaluation, assessment and plan.  Past Medical History:  Diagnosis Date   Asthma    ECZEMA 03/09/2009   Qualifier: Diagnosis of  By: Redmond Pulling  MD, Valerie     GERD 03/09/2009   Qualifier: Diagnosis of  By: Redmond Pulling  MD, Valerie     Hypertension     Review of Systems:  Constitutional: Positive for weight loss. Negative for fever or fatigue MSK: Positive for knee pain and some back discomfort Abdomen: Negative for abdominal pain, constipation or diarrhea Neuro: Negative for headache, dizziness or weakness  Physical Exam: General: Pleasant, well-appearing middle-aged woman. No acute distress. Cardiac: RRR. No murmurs, rubs or gallops. No LE edema Respiratory: Lungs CTAB. No wheezing or crackles. Abdominal: Soft, symmetric and non tender. Normal BS. MSK: Mild tenderness to palpation right knee, no crepitus or effusion. Neuro: A&O x 3. Moves all extremities.  Normal sensation to gross touch. Psych: Appropriate mood and affect.  Vitals:   11/05/22 1506  BP: 117/76  Pulse: 81  Temp: 98.1 F (36.7 C)  TempSrc: Oral  SpO2: 99%  Weight: 243 lb 4.8 oz (110.4 kg)  Height: 5\' 7"  (1.702 m)    Assessment & Plan:   Osteoarthritis of both knees Patient here for follow-up on her knee arthritis. Patient being followed by orthopedic surgery with plan for right knee replacement after patient has met weight goal. Patient reports that she was out for 2 weeks but decided to go back to work at [employer] on Monday. 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 Still working for 2 days, she woke up with significant pain in her right knee as well as some stiffness in her back. She has been using a cane to ambulate due to the pain. She has been working with the weight loss clinic and is close to her weight goal for  surgery. Patient informed that prolonged standing at work can worsen her arthritis.  Patient agreeable to this plan to keep her out of work to work toward her weight loss goal for surgery with plan for reevaluation in 4 weeks.  She has been using some over-the-counter Biofreeze to relieve pain.  Plan: -Work letter to keep patient out of work for the next 4 weeks -Start Voltaren 1% gel, apply 4 times to knees -OTC ibuprofen as needed for pain -Need to work with the weight loss clinic -Return in 4 weeks for reevaluation  Morbid obesity (HCC)-starting bmi 42.29 Patient is currently working with a weight loss clinic. Starting weight was 262 lbs on 06/10/2022. Her weight is down to 243 lbs today, down 15 lbs since last office visit 2 months ago. BMI is now below 40. Her weight goal for knee surgery is around 235 lbs. I congratulated patient on the weight loss and encouraged her to continue adhering to do weight loss plan Body mass index is 38.11 kg/m.  Plan: -Continue to follow-up with the weight loss clinic as scheduled on 2/27 -Continue Topamax 50 mg twice daily -Follow-up in 4 weeks  Essential hypertension, benign Patient's amlodipine was increased from 5 to 10 mg during last office visit. BP is now stable on her current regimen. Vitals:   11/05/22 1506  BP: 117/76   Plan: -Continue HCTZ 25 mg daily -Continue amlodipine 10 mg daily  See Encounters Tab for problem based charting.  Patient discussed with Dr. Lorenz Coaster, MD, MPH

## 2022-11-06 ENCOUNTER — Encounter: Payer: Self-pay | Admitting: Student

## 2022-11-06 NOTE — Assessment & Plan Note (Signed)
Patient is currently working with a weight loss clinic. Starting weight was 262 lbs on 06/10/2022. Her weight is down to 243 lbs today, down 15 lbs since last office visit 2 months ago. BMI is now below 40. Her weight goal for knee surgery is around 235 lbs. I congratulated patient on the weight loss and encouraged her to continue adhering to do weight loss plan Body mass index is 38.11 kg/m.  Plan: -Continue to follow-up with the weight loss clinic as scheduled on 2/27 -Continue Topamax 50 mg twice daily -Follow-up in 4 weeks

## 2022-11-06 NOTE — Assessment & Plan Note (Signed)
Patient here for follow-up on her knee arthritis. Patient being followed by orthopedic surgery with plan for right knee replacement after patient has met weight goal. Patient reports that she was out for 2 weeks but decided to go back to work at [employer] on Monday. 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 Still working for 2 days, she woke up with significant pain in her right knee as well as some stiffness in her back. She has been using a cane to ambulate due to the pain. She has been working with the weight loss clinic and is close to her weight goal for surgery. Patient informed that prolonged standing at work can worsen her arthritis.  Patient agreeable to this plan to keep her out of work to work toward her weight loss goal for surgery with plan for reevaluation in 4 weeks.  She has been using some over-the-counter Biofreeze to relieve pain.  Plan: -Work letter to keep patient out of work for the next 4 weeks -Start Voltaren 1% gel, apply 4 times to knees -OTC ibuprofen as needed for pain -Need to work with the weight loss clinic -Return in 4 weeks for reevaluation

## 2022-11-06 NOTE — Assessment & Plan Note (Addendum)
Patient's amlodipine was increased from 5 to 10 mg during last office visit. BP is now stable on her current regimen. Vitals:   11/05/22 1506  BP: 117/76   Plan: -Continue HCTZ 25 mg daily -Continue amlodipine 10 mg daily

## 2022-11-12 NOTE — Progress Notes (Signed)
Internal Medicine Clinic Attending  Case discussed with the resident at the time of the visit.  We reviewed the resident's history and exam and pertinent patient test results.  I agree with the assessment, diagnosis, and plan of care documented in the resident's note.  

## 2022-11-18 NOTE — Progress Notes (Unsigned)
Chief Complaint:   OBESITY Lynn Bond is here to discuss her progress with her obesity treatment plan along with follow-up of her obesity related diagnoses. [name] is on the Category 3 Plan with breakfast and lunch options and states she is following her eating plan approximately 100% of the time. [name] states she is not currently exercising.  Today's visit was #: 7 Starting weight: 262 lbs Starting date: 06/10/2022 Today's weight: 248 lbs Today's date: 10/27/2022 Total lbs lost to date: 14 Total lbs lost since last in-office visit: 0  Interim History: [name] is in lot of pain with left leg and back pain. Was at [employer] and slipped and fell 1 year ago. Even though she had knee pain prior with softball in middle and high school, it got much worse after her fall. Seeing her PCP for management of her leg pain. Patient is up 4+ lbs in fat mass and lost muscle mass of 3 lbs, and gained 5 lbs in water. Breakfast= [employer], Lunch= tuna or chicken pack 2 days with mayo, nothing else. Dinner= chicken or fish with rice with broccoli (not measuring).   Subjective:   1. 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 Symptoms are stable and controlled. No issues.   2. Essential hypertension Lynn Bond requests a refill of her chronic blood pressure medications (Norvasc). PCP dispenses blood pressure medications of that and hydrochlorothiazide. Asymptomatic with no concerns.   3. Vitamin D deficiency Labs checked at last office visit. Last Vitamin D level was 52.7, up from <20. Now at goal. I discussed labs with the patient today.   4. Pre-diabetes- with cravings [name]'s last A1c was 5.8, tolerating Topamax and she increased dose on her own to 1 tablet PO BID. Tolerating well and without cravings or hunger at all. She states she loves junk food, potato chips. I discussed labs with the patient today.   Assessment/Plan:  No orders of the defined types were placed in this  encounter.   Medications Discontinued During This Encounter  Medication Reason   topiramate (TOPAMAX) 50 MG tablet Reorder   Vitamin D, Ergocalciferol, (DRISDOL) 1.25 MG (50000 UNIT) CAPS capsule Reorder     Meds ordered this encounter  Medications   topiramate (TOPAMAX) 50 MG tablet    Sig: Take 1 tablet (50 mg total) by mouth 2 (two) times daily.    Dispense:  60 tablet    Refill:  0   Vitamin D, Ergocalciferol, (DRISDOL) 1.25 MG (50000 UNIT) CAPS capsule    Sig: Take 1 capsule (50,000 Units total) by mouth every 7 (seven) days.    Dispense:  4 capsule    Refill:  0     1. Environmental and seasonal allergies If symptoms worsen, with her history of reactive airway diagnosis. Consider adding Singulair to regimen. Continue current treatment plan.  2. Essential hypertension Blood pressure at goal. Reminded patient the importance of PCP follow up and blood pressure medications to be refilled by them.   3. Vitamin D deficiency We will refill Ergocal with no change in dose. Vitamin D level was goal. Continue with weight loss.   - Vitamin D, Ergocalciferol, (DRISDOL) 1.25 MG (50000 UNIT) CAPS capsule; Take 1 capsule (50,000 Units total) by mouth every 7 (seven) days.  Dispense: 4 capsule; Refill: 0  4. Pre-diabetes- with cravings We will refill and increase Topamax to 50 mg BID. A1c still at 5.8. Continue prudent nutritional plan and weight loss. Counseling was done.   - topiramate (  TOPAMAX) 50 MG tablet; Take 1 tablet (50 mg total) by mouth 2 (two) times daily.  Dispense: 60 tablet; Refill: 0  5. Morbid obesity (HCC)-starting bmi 42.29  6. BMI 40.0-44.9, adult (HCC)-current bmi 40.1 Lynn Bond is currently in the action stage of change. As such, her goal is to continue with weight loss efforts. She has agreed to the Category 3 Plan with breakfast and lunch options.   Eating Out guide was given and counseled on the importance of following the meal plan closely.   Exercise goals: As  is due to pain.   Behavioral modification strategies: decreasing eating out.  Lynn Bond has agreed to follow-up with our clinic in 3 to 4 weeks. She was informed of the importance of frequent follow-up visits to maximize her success with intensive lifestyle modifications for her multiple health conditions.   Objective:   Blood pressure 133/83, pulse 69, temperature 98.4 F (36.9 C), height 5' 6"$  (1.676 m), weight 248 lb 3.2 oz (112.6 kg), SpO2 100 %. Body mass index is 40.06 kg/m.  General: Cooperative, alert, well developed, in no acute distress. HEENT: Conjunctivae and lids unremarkable. Cardiovascular: Regular rhythm.  Lungs: Normal work of breathing. Neurologic: No focal deficits.   Lab Results  Component Value Date   CREATININE 0.81 06/10/2022   BUN 17 06/10/2022   NA 142 06/10/2022   K 3.8 06/10/2022   CL 101 06/10/2022   CO2 24 06/10/2022   Lab Results  Component Value Date   ALT 11 06/10/2022   AST 11 06/10/2022   ALKPHOS 71 06/10/2022   BILITOT 0.4 06/10/2022   Lab Results  Component Value Date   HGBA1C 5.8 (H) 10/06/2022   HGBA1C 5.8 (H) 06/10/2022   HGBA1C 5.4 07/24/2021   HGBA1C 6.0 (A) 10/13/2019   HGBA1C 5.6 12/13/2015   Lab Results  Component Value Date   INSULIN 6.2 06/10/2022   Lab Results  Component Value Date   TSH 1.290 06/10/2022   Lab Results  Component Value Date   CHOL 162 06/10/2022   HDL 60 06/10/2022   LDLCALC 92 06/10/2022   TRIG 49 06/10/2022   CHOLHDL 2.7 06/10/2022   Lab Results  Component Value Date   VD25OH 52.8 10/06/2022   VD25OH 19.8 (L) 06/10/2022   Lab Results  Component Value Date   WBC 5.2 06/10/2022   HGB 11.4 06/10/2022   HCT 38.6 06/10/2022   MCV 79 06/10/2022   PLT 314 06/10/2022   Lab Results  Component Value Date   IRON 30 (L) 10/11/2012   TIBC 321 10/11/2012   FERRITIN 22 10/11/2012   Attestation Statements:   Reviewed by clinician on day of visit: allergies, medications, problem list, medical  history, surgical history, family history, social history, and previous encounter notes.  Time spent on visit including pre-visit chart review and post-visit care and charting was 40 minutes.   Wilhemena Durie, am acting as transcriptionist for [employer], DO.   I have reviewed the above documentation for accuracy and completeness, and I agree with the above. Marjory Sneddon, D.O.  The [place] was signed into law in 2016 which includes the topic of electronic health records.  This provides immediate access to information in MyChart.  This includes consultation notes, operative notes, office notes, lab results and pathology reports.  If you have any questions about what you read please let us know at your next visit so we can discuss your concerns and take corrective action if need be.  We are right here with you.

## 2022-11-25 ENCOUNTER — Ambulatory Visit (INDEPENDENT_AMBULATORY_CARE_PROVIDER_SITE_OTHER): Payer: Self-pay | Admitting: Family Medicine

## 2022-11-26 ENCOUNTER — Encounter (INDEPENDENT_AMBULATORY_CARE_PROVIDER_SITE_OTHER): Payer: Self-pay | Admitting: Physician Assistant

## 2022-11-26 ENCOUNTER — Ambulatory Visit (INDEPENDENT_AMBULATORY_CARE_PROVIDER_SITE_OTHER): Payer: PRIVATE HEALTH INSURANCE | Admitting: Physician Assistant

## 2022-11-26 VITALS — BP 118/83 | HR 89 | Temp 98.2°F | Ht 66.0 in | Wt 233.0 lb

## 2022-11-26 DIAGNOSIS — E559 Vitamin D deficiency, unspecified: Secondary | ICD-10-CM

## 2022-11-26 DIAGNOSIS — R7303 Prediabetes: Secondary | ICD-10-CM

## 2022-11-26 DIAGNOSIS — E669 Obesity, unspecified: Secondary | ICD-10-CM

## 2022-11-26 DIAGNOSIS — Z6837 Body mass index (BMI) 37.0-37.9, adult: Secondary | ICD-10-CM | POA: Diagnosis not present

## 2022-11-26 MED ORDER — VITAMIN D (ERGOCALCIFEROL) 1.25 MG (50000 UNIT) PO CAPS: 50000.0000 [IU] | ORAL_CAPSULE | ORAL | 0 refills | Status: DC

## 2022-11-26 NOTE — Progress Notes (Signed)
Chief Complaint:   OBESITY Lynn Bond is here to discuss her progress with her obesity treatment plan along with follow-up of her obesity related diagnoses. Lynn Bond is on the Category 3 Plan and states she is following her eating plan approximately 100% of the time. Lynn Bond states she is doing water aerobics, treadmill and bike 15 minutes 7 times per week.  Today's visit was #: 8 Starting weight: 262 lbs Starting date: 06/10/2022 Today's weight: 233 lbs Today's date: 11/26/2022 Total lbs lost to date: 29 lbs Total lbs lost since last in-office visit: 15 lbs  Interim History: Lynn Bond has done very well with weight loss.  She and her family started a $1000 challenge between them to try and loose weight and she has been much more mindful and compliant with her nutrition plan.  She has been working out with family at [employer], mainly doing water aerobics as her right knee will not tolerate consistent weight bearing and only for short periods, but she reports her pain is not as bad as she is not working as much now.  Hunger- well controlled.  She plans to follow up with Orthopedics soon as BMI is now 37.6 and she is ready for total knee replacement surgery.   Subjective:   1. Pre-diabetes A1c- 5.8 10/06/22- stable. No medications.  Working on nutrition plan to decrease simple carbohydrates, increase lean proteins and exercise to promote weight loss, improve glycemic control and prevent progression to Type 2 diabetes.    2. Vitamin D deficiency Vitamin D Deficiency Vitamin D is at goal of 50.  Most recent vitamin D level was 52.8. She is on  prescription ergocalciferol 50,000 IU weekly. Lab Results  Component Value Date   VD25OH 52.8 10/06/2022   VD25OH 19.8 (L) 06/10/2022    Plan: Refill prescription vitamin D 50,000 IU weekly.   3. Obesity (HCC)-starting bmi 42.29 4. BMI 37.0-37.9, adult Current BMI 37.6  Assessment/Plan:   1. Pre-diabetes Continue Working on nutrition  plan to decrease simple carbohydrates, increase lean proteins and exercise to promote weight loss, improve glycemic control and prevent progression to Type 2 diabetes.    2. Vitamin D deficiency Refill prescription vitamin D 50,000 IU weekly. - Vitamin D, Ergocalciferol, (DRISDOL) 1.25 MG (50000 UNIT) CAPS capsule; Take 1 capsule (50,000 Units total) by mouth every 7 (seven) days.  Dispense: 4 capsule; Refill: 0  3. Obesity (HCC)-starting bmi 42.29   4. BMI 37.0-37.9, adult Current BMI 37.6   Lynn Bond is currently in the action stage of change. As such, her goal is to continue with weight loss efforts. She has agreed to the Category 3 Plan.   Exercise goals:  Continue exercise as is.   Behavioral modification strategies: increasing lean protein intake, decreasing simple carbohydrates, increasing water intake, and no skipping meals.  Lynn Bond has agreed to follow-up with our clinic in 4 weeks. She was informed of the importance of frequent follow-up visits to maximize her success with intensive lifestyle modifications for her multiple health conditions.    Objective:   Blood pressure 118/83, pulse 89, temperature 98.2 F (36.8 C), height '5\' 6"'$  (1.676 m), weight 233 lb (105.7 kg), SpO2 98 %. Body mass index is 37.61 kg/m.  General: Cooperative, alert, well developed, in no acute distress. HEENT: Conjunctivae and lids unremarkable. Cardiovascular: Regular rhythm.  Lungs: Normal work of breathing. Neurologic: No focal deficits.   Lab Results  Component Value Date   CREATININE 0.81 06/10/2022   BUN 17  06/10/2022   NA 142 06/10/2022   K 3.8 06/10/2022   CL 101 06/10/2022   CO2 24 06/10/2022   Lab Results  Component Value Date   ALT 11 06/10/2022   AST 11 06/10/2022   ALKPHOS 71 06/10/2022   BILITOT 0.4 06/10/2022   Lab Results  Component Value Date   HGBA1C 5.8 (H) 10/06/2022   HGBA1C 5.8 (H) 06/10/2022   HGBA1C 5.4 07/24/2021   HGBA1C 6.0 (A) 10/13/2019   HGBA1C 5.6  12/13/2015   Lab Results  Component Value Date   INSULIN 6.2 06/10/2022   Lab Results  Component Value Date   TSH 1.290 06/10/2022   Lab Results  Component Value Date   CHOL 162 06/10/2022   HDL 60 06/10/2022   LDLCALC 92 06/10/2022   TRIG 49 06/10/2022   CHOLHDL 2.7 06/10/2022   Lab Results  Component Value Date   VD25OH 52.8 10/06/2022   VD25OH 19.8 (L) 06/10/2022   Lab Results  Component Value Date   WBC 5.2 06/10/2022   HGB 11.4 06/10/2022   HCT 38.6 06/10/2022   MCV 79 06/10/2022   PLT 314 06/10/2022   Lab Results  Component Value Date   IRON 30 (L) 10/11/2012   TIBC 321 10/11/2012   FERRITIN 22 10/11/2012    Obesity Behavioral Intervention:   Approximately 15 minutes were spent on the discussion below.  ASK: We discussed the diagnosis of obesity with Lynn Bond today and Lynn Bond agreed to give [place] permission to discuss obesity behavioral modification therapy today.  ASSESS: Lynn Bond has the diagnosis of obesity and her BMI today is 37.6. Lynn Bond is in the action stage of change.   ADVISE: Lynn Bond was educated on the multiple health risks of obesity as well as the benefit of weight loss to improve her health. She was advised of the need for long term treatment and the importance of lifestyle modifications to improve her current health and to decrease her risk of future health problems.  AGREE: Multiple dietary modification options and treatment options were discussed and Lynn Bond agreed to follow the recommendations documented in the above note.  ARRANGE: Lynn Bond was educated on the importance of frequent visits to treat obesity as outlined per CMS and USPSTF guidelines and agreed to schedule her next follow up appointment today.  Attestation Statements:   Reviewed by clinician on day of visit: allergies, medications, problem list, medical history, surgical history, family history, social history, and previous encounter notes.  Genevieve Ritzel,PA-C

## 2022-12-03 ENCOUNTER — Ambulatory Visit (INDEPENDENT_AMBULATORY_CARE_PROVIDER_SITE_OTHER): Payer: Medicaid Other | Admitting: Student

## 2022-12-03 ENCOUNTER — Encounter: Payer: Self-pay | Admitting: Student

## 2022-12-03 VITALS — BP 127/83 | HR 74 | Temp 98.0°F | Ht 66.0 in | Wt 237.8 lb

## 2022-12-03 DIAGNOSIS — I10 Essential (primary) hypertension: Secondary | ICD-10-CM | POA: Diagnosis not present

## 2022-12-03 DIAGNOSIS — Z0000 Encounter for general adult medical examination without abnormal findings: Secondary | ICD-10-CM

## 2022-12-03 DIAGNOSIS — R7303 Prediabetes: Secondary | ICD-10-CM | POA: Diagnosis not present

## 2022-12-03 DIAGNOSIS — Z6838 Body mass index (BMI) 38.0-38.9, adult: Secondary | ICD-10-CM

## 2022-12-03 DIAGNOSIS — Z1211 Encounter for screening for malignant neoplasm of colon: Secondary | ICD-10-CM

## 2022-12-03 DIAGNOSIS — M170 Bilateral primary osteoarthritis of knee: Secondary | ICD-10-CM

## 2022-12-03 DIAGNOSIS — E669 Obesity, unspecified: Secondary | ICD-10-CM

## 2022-12-03 MED ORDER — AMLODIPINE BESYLATE 10 MG PO TABS: 10.0000 mg | ORAL_TABLET | Freq: Every day | ORAL | 2 refills | Status: DC

## 2022-12-03 MED ORDER — TOPIRAMATE 50 MG PO TABS: 50.0000 mg | ORAL_TABLET | Freq: Two times a day (BID) | ORAL | 2 refills | Status: DC

## 2022-12-03 MED ORDER — HYDROCHLOROTHIAZIDE 25 MG PO TABS: 25.0000 mg | ORAL_TABLET | Freq: Every day | ORAL | 2 refills | Status: DC

## 2022-12-03 NOTE — Progress Notes (Signed)
Internal Medicine Clinic Attending  Case discussed with Dr. Alfonse Spruce  At the time of the visit.  We reviewed the resident's history and exam and pertinent patient test results.  I agree with the assessment, diagnosis, and plan of care documented in the resident's note.  We're not familiar with the practice of dental extraction prior to TKR in the absence of bacteremia or endocarditis of dental origin.  Last encounter with [facility] was 04/2021.

## 2022-12-03 NOTE — Assessment & Plan Note (Signed)
Blood pressure is well-controlled 127/83.  -Continue HCTZ 25 mg and amlodipine 10 mg daily

## 2022-12-03 NOTE — Assessment & Plan Note (Signed)
Good progress in the last few months.  Continue Topamax and following weight loss clinic

## 2022-12-03 NOTE — Progress Notes (Signed)
   CC: Follow-up on right knee arthritis  HPI:  [name] is a 57 y.o. living with hypertension, obesity, right knee osteoarthritis, who presents to clinic to follow-up on her right knee pain.  Please see problem based charting for detail  Past Medical History:  Diagnosis Date   Asthma    ECZEMA 03/09/2009   Qualifier: Diagnosis of  By: Redmond Pulling  MD, Valerie     GERD 03/09/2009   Qualifier: Diagnosis of  By: Redmond Pulling  MD, Valerie     Hypertension    Review of Systems:  per HPI  Physical Exam:  Vitals:   12/03/22 0814  BP: 127/83  Pulse: 74  Temp: 98 F (36.7 C)  TempSrc: Oral  SpO2: 100%  Weight: 237 lb 12.8 oz (107.9 kg)  Height: '5\' 6"'$  (1.676 m)   Physical Exam Constitutional:      General: She is not in acute distress.    Appearance: She is not ill-appearing.  HENT:     Head: Normocephalic.  Eyes:     General:        Right eye: No discharge.     Conjunctiva/sclera: Conjunctivae normal.  Cardiovascular:     Rate and Rhythm: Normal rate and regular rhythm.  Pulmonary:     Effort: Pulmonary effort is normal. No respiratory distress.     Breath sounds: Normal breath sounds. No wheezing.  Musculoskeletal:        General: Normal range of motion.     Cervical back: Normal range of motion.     Comments: No palpable effusion of right knee.  No pain to palpation.  Neurological:     Mental Status: She is alert. Mental status is at baseline.  Psychiatric:        Mood and Affect: Mood normal.      Assessment & Plan:   See Encounters Tab for problem based charting.  Essential hypertension, benign Blood pressure is well-controlled 127/83.  -Continue HCTZ 25 mg and amlodipine 10 mg daily  Osteoarthritis of both knees Patient is doing much better with the right knee arthritis.  She has lost 10 pounds in the last few months by working with water aerobic exercise at the gym.  She said that her pain has improved significantly.  Currently working her night job  only.  Her daytime job is very demanding and she cannot perform the work with her right knee pain.  She is following with an orthopedic doctor and her next appointment is in 3 weeks.  She should be qualified for right hip replacement with a BMI of 38 now.  She is applying for Medicaid to get her teeth removed before her right knee replacement (per orthopedic).  Work note provided.  Follow-up in 3 months.  Hopefully she will have her surgery prior to next follow-up.  Obesity, unspecified Good progress in the last few months.  Continue Topamax and following weight loss clinic  Healthcare maintenance -Fit test ordered for colon cancer screening -Received 2 shot of Shingrix -Patient cannot tolerate frog-leg position due to right knee pain so we will hold off on Pap smear   Patient discussed with Dr. Jimmye Norman

## 2022-12-03 NOTE — Assessment & Plan Note (Signed)
-  Fit test ordered for colon cancer screening -Received 2 shot of Shingrix -Patient cannot tolerate frog-leg position due to right knee pain so we will hold off on Pap smear

## 2022-12-03 NOTE — Assessment & Plan Note (Addendum)
Patient is doing much better with the right knee arthritis.  She has lost 10 pounds in the last few months by working with water aerobic exercise at the gym.  She said that her pain has improved significantly.  Currently working her night job only.  Her daytime job is very demanding and she cannot perform the work with her right knee pain.  She is following with an orthopedic doctor and her next appointment is in 3 weeks.  She should be qualified for right hip replacement with a BMI of 38 now.  She is applying for Medicaid to get her teeth removed before her right knee replacement (per orthopedic).  Work note provided.  Follow-up in 3 months.  Hopefully she will have her surgery prior to next follow-up.

## 2022-12-03 NOTE — Patient Instructions (Addendum)
Lynn Bond,  It was nice seeing you in the clinic today.  I am glad that you are doing much better.  Please continue water aerobic exercise.  Please follow-up with your orthopedic doctor for your right knee replacement.  Please stop taking the high-dose vitamin D.  You can take the regular dose vitamin D.   Please bring back a stool sample for colon cancer screening test next visit  Returns in 3 months,  Dr. Alfonse Spruce

## 2022-12-19 ENCOUNTER — Ambulatory Visit (INDEPENDENT_AMBULATORY_CARE_PROVIDER_SITE_OTHER): Payer: Commercial Managed Care - HMO | Admitting: Student

## 2022-12-19 ENCOUNTER — Encounter: Payer: Self-pay | Admitting: Student

## 2022-12-19 DIAGNOSIS — J3089 Other allergic rhinitis: Secondary | ICD-10-CM

## 2022-12-19 DIAGNOSIS — M170 Bilateral primary osteoarthritis of knee: Secondary | ICD-10-CM

## 2022-12-19 DIAGNOSIS — J301 Allergic rhinitis due to pollen: Secondary | ICD-10-CM | POA: Diagnosis not present

## 2022-12-19 MED ORDER — FLUTICASONE PROPIONATE 50 MCG/ACT NA SUSP: 2.0000 | Freq: Every day | NASAL | 2 refills | Status: DC

## 2022-12-19 MED ORDER — FEXOFENADINE HCL 180 MG PO TABS: 180.0000 mg | ORAL_TABLET | Freq: Every day | ORAL | 2 refills | Status: DC

## 2022-12-19 NOTE — Progress Notes (Signed)
  [facility] Internal Medicine Residency Telephone Encounter Continuity Care Appointment  HPI:  This telephone encounter was created for [name] on 12/19/2022 for the following purpose/cc environmental allergies.   Past Medical History:  Past Medical History:  Diagnosis Date   Asthma    ECZEMA 03/09/2009   Qualifier: Diagnosis of  By: Redmond Pulling  MD, Valerie     GERD 03/09/2009   Qualifier: Diagnosis of  By: Redmond Pulling  MD, Mateo Flow     Hypertension      ROS:  Nasal congestion, eye itching, R knee pain,   Assessment / Plan / Recommendations:  Please see A&P under problem oriented charting for assessment of the patient's acute and chronic medical conditions.  As always, pt is advised that if symptoms worsen or new symptoms arise, they should go to an urgent care facility or to to ER for further evaluation.   Consent and Medical Decision Making:  Patient discussed with Dr. Jimmye Norman This is a telephone encounter between Lynn Bond and Lynn Bond on 12/19/2022 for environmental allergies and fall. The visit was conducted with the patient located at home and Lynn Bond at [facility]. The patient's identity was confirmed using their DOB and current address. The patient has consented to being evaluated through a telephone encounter and understands the associated risks (an examination cannot be done and the patient may need to come in for an appointment) / benefits (allows the patient to remain at home, decreasing exposure to coronavirus). I personally spent 25 minutes on medical discussion.

## 2022-12-19 NOTE — Assessment & Plan Note (Signed)
Patient reports she tripped and fell 2 days ago.  It hit her right knee which has some swelling and pain.  No bruising or skin breaks.  She is able to walk with a cane and pain is manageable with Tylenol and her Voltaren gel.  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  Work note provided for her.  Follow-up in clinic as needed if knee pain worsening.

## 2022-12-19 NOTE — Assessment & Plan Note (Signed)
Reports history of seasonal allergies in the springtime with pollen.  Is currently having itching in her eyes, watery eyes, and nose congestion.  She has taken Allegra and Flonase with good effect in the past.  Will refill her Allegra 180 mg daily and Flonase.  Also recommend Pataday eyedrops for eye symptoms.

## 2022-12-25 ENCOUNTER — Encounter (INDEPENDENT_AMBULATORY_CARE_PROVIDER_SITE_OTHER): Payer: Self-pay | Admitting: Physician Assistant

## 2022-12-25 ENCOUNTER — Ambulatory Visit (INDEPENDENT_AMBULATORY_CARE_PROVIDER_SITE_OTHER): Payer: Self-pay | Admitting: Physician Assistant

## 2022-12-25 VITALS — BP 114/79 | HR 78 | Temp 98.9°F | Ht 66.0 in | Wt 231.0 lb

## 2022-12-25 DIAGNOSIS — I10 Essential (primary) hypertension: Secondary | ICD-10-CM | POA: Insufficient documentation

## 2022-12-25 DIAGNOSIS — Z6836 Body mass index (BMI) 36.0-36.9, adult: Secondary | ICD-10-CM | POA: Insufficient documentation

## 2022-12-25 DIAGNOSIS — Z6837 Body mass index (BMI) 37.0-37.9, adult: Secondary | ICD-10-CM

## 2022-12-25 DIAGNOSIS — R7303 Prediabetes: Secondary | ICD-10-CM

## 2022-12-25 DIAGNOSIS — E559 Vitamin D deficiency, unspecified: Secondary | ICD-10-CM

## 2022-12-25 NOTE — Progress Notes (Signed)
Office: [phone]  /  Fax: [phone]  WEIGHT SUMMARY AND BIOMETRICS  Vitals Temp: 98.9 F (37.2 C) BP: 114/79 Pulse Rate: 78 SpO2: 98 %   Anthropometric Measurements Height: 5\' 6"  (1.676 m) Weight: 231 lb (104.8 kg) BMI (Calculated): 37.3 Weight at Last Visit: 233 lb Weight Lost Since Last Visit: 2 lb Weight Gained Since Last Visit: 0 lb Starting Weight: 262 lb Total Weight Loss (lbs): 31 lb (14.1 kg)   Body Composition  Body Fat %: 48.5 % Fat Mass (lbs): 112.2 lbs Muscle Mass (lbs): 113 lbs Total Body Water (lbs): 83.4 lbs Visceral Fat Rating : 14   Other Clinical Data Fasting: No Labs: No Today's Visit #: 9 Starting Date: 06/10/22     HPI  Chief Complaint: OBESITY  Lynn Bond is here to discuss her progress with her obesity treatment plan. She is on the the Category 3 Plan and states she is following her eating plan approximately 75 % of the time. She states she is exercising/water aerobic 15 minutes 4 times per week.   Interval History:  Since last office visit she is down 2 lbs. Lynn Bond of 29 lbs since Sept. 2023/ Down 72 lbs since 11/2019! Has focused on getting adequate protein. Won family weight challenge!  She is getting a full dental extraction on 12/31/22 and we discussed soft foods prep and using a blender to puree foods, protein supplementation with shakes and provided written protein shake information. She will need to heal for 1 month prior to denture fitting.   She sees orthopedics 01/01/23 for preparation for total knee replacement.  Exercising in pool. Has difficulty walking due to knee pain.  She is working only her night job as was having too much difficulty walking and standing.   Pharmacotherapy: Topamax 50 mg twice daily. No side effects with topamax. PCP refilled at recent visit and has refills ordered.   PHYSICAL EXAM:  Blood pressure 114/79, pulse 78, temperature 98.9 F (37.2 C), height 5\' 6"  (1.676 m), weight 231 lb (104.8 kg),  SpO2 98 %. Body mass index is 37.28 kg/m.  General: She is overweight, cooperative, alert, well developed, and in no acute distress. PSYCH: Has normal mood, affect and thought process.   Lungs: Normal breathing effort, no conversational dyspnea.  DIAGNOSTIC DATA REVIEWED:  BMET    Component Value Date/Time   NA 142 06/10/2022 1214   K 3.8 06/10/2022 1214   CL 101 06/10/2022 1214   CO2 24 06/10/2022 1214   GLUCOSE 80 06/10/2022 1214   GLUCOSE 68 (L) 04/25/2014 1635   BUN 17 06/10/2022 1214   CREATININE 0.81 06/10/2022 1214   CREATININE 0.81 04/25/2014 1635   CALCIUM 9.6 06/10/2022 1214   GFRNONAA 83 10/13/2019 1419   GFRNONAA 76 10/11/2012 0917   GFRAA 95 10/13/2019 1419   GFRAA 88 10/11/2012 0917   Lab Results  Component Value Date   HGBA1C 5.8 (H) 10/06/2022   HGBA1C 5.8 01/28/2010   Lab Results  Component Value Date   INSULIN 6.2 06/10/2022   Lab Results  Component Value Date   TSH 1.290 06/10/2022   CBC    Component Value Date/Time   WBC 5.2 06/10/2022 1214   WBC 8.2 10/11/2012 0917   RBC 4.91 06/10/2022 1214   RBC 4.20 10/11/2012 0918   RBC 4.25 10/11/2012 0917   HGB 11.4 06/10/2022 1214   HCT 38.6 06/10/2022 1214   PLT 314 06/10/2022 1214   MCV 79 06/10/2022 1214   MCH 23.2 (L) 06/10/2022  1214   MCH 25.4 (L) 10/11/2012 0917   MCHC 29.5 (L) 06/10/2022 1214   MCHC 31.8 10/11/2012 0917   RDW 16.8 (H) 06/10/2022 1214   Iron Studies    Component Value Date/Time   IRON 30 (L) 10/11/2012 0918   TIBC 321 10/11/2012 0918   FERRITIN 22 10/11/2012 0918   IRONPCTSAT 9 (L) 10/11/2012 0918   Lipid Panel     Component Value Date/Time   CHOL 162 06/10/2022 1214   TRIG 49 06/10/2022 1214   HDL 60 06/10/2022 1214   CHOLHDL 2.7 06/10/2022 1214   CHOLHDL 2.9 10/11/2012 0917   VLDL 7 10/11/2012 0917   LDLCALC 92 06/10/2022 1214   Hepatic Function Panel     Component Value Date/Time   PROT 7.2 06/10/2022 1214   ALBUMIN 4.0 06/10/2022 1214   AST 11  06/10/2022 1214   ALT 11 06/10/2022 1214   ALKPHOS 71 06/10/2022 1214   BILITOT 0.4 06/10/2022 1214      Component Value Date/Time   TSH 1.290 06/10/2022 1214   Nutritional Lab Results  Component Value Date   VD25OH 52.8 10/06/2022   VD25OH 19.8 (L) 06/10/2022    ASSOCIATED CONDITIONS ADDRESSED TODAY  ASSESSMENT AND PLAN  Problem List Items Addressed This Visit     Pre-diabetes - Primary    Prediabetes with Cravings.  Last A1c was 5.8  Medication(s): None Topamax 50 mg twice daily for cravings. No side effects with topamax. She is working on Camera operator to decrease simple carbohydrates, increase lean proteins and exercise to promote weight loss, improve glycemic control and prevent progression to Type 2 diabetes.   Lab Results  Component Value Date   HGBA1C 5.8 (H) 10/06/2022   HGBA1C 5.8 (H) 06/10/2022   HGBA1C 5.4 07/24/2021   HGBA1C 6.0 (A) 10/13/2019   HGBA1C 5.6 12/13/2015   Lab Results  Component Value Date   INSULIN 6.2 06/10/2022   Plan: Continue topamax for cravings.   Continue working on nutrition plan to decrease simple carbohydrates, increase lean proteins and exercise to promote weight loss, improve glycemic control and prevent progression to Type 2 diabetes.         Vitamin D deficiency    Vitamin D Deficiency Vitamin D is at goal of 50.  Most recent vitamin D level was 52.8. PCP stopped Ergocalciferol 12/03/22 based on last labs. Will plan to follow up over the next 2 months. . Lab Results  Component Value Date   VD25OH 52.8 10/06/2022   VD25OH 19.8 (L) 06/10/2022   Plan: Continues multi vitamin daily at this point as Ergocalciferol discontinued at PCP visit 12/03/2022.  Will plan to recheck level over next 2-3 months.        Morbid obesity (HCC)-starting bmi 42.29   Essential hypertension    Hypertension Hypertension well controlled and no significant medication side effects noted.  Medication(s): amlodipine 10 mg daily    HCTZ 25 mg  daily      No symptoms of hypotension.  BP Readings from Last 3 Encounters:  12/25/22 114/79  12/03/22 127/83  11/26/22 118/83   Lab Results  Component Value Date   CREATININE 0.81 06/10/2022   CREATININE 0.90 07/24/2021   CREATININE 0.82 10/13/2019  No results found for: "GFR"  Plan: Continue all antihypertensives at current dosages.Continue to work on nutrition plan to promote weight loss and improve BP control. Monitor for hypotension as continues weight loss efforts.         BMI 37.0-37.9, adult Current  BMI 37.3       TREATMENT PLAN FOR OBESITY:  Recommended Dietary Goals  Adaleya is currently in the action stage of change. As such, her goal is to continue weight management plan. She has agreed to the Category 3 Plan.  Behavioral Intervention  We discussed the following Behavioral Modification Strategies today: increasing lean protein intake, decreasing simple carbohydrates , increasing vegetables, increasing water intake, work on meal planning and easy cooking plans, planning for success, and keeping healthy foods at home.  Additional resources provided today: NA  Recommended Physical Activity Goals  Layza has been advised to work up to 150 minutes of moderate intensity aerobic activity a week and strengthening exercises 2-3 times per week for cardiovascular health, weight loss maintenance and preservation of muscle mass.   She has agreed to Continue current level of physical activity    Pharmacotherapy We discussed various medication options to help Krisi with her weight loss efforts and we both agreed to continue topamax (reordered by PCP with refills at visit 12/03/22).    Return in about 4 weeks (around 01/22/2023).Marland Kitchen She was informed of the importance of frequent follow up visits to maximize her success with intensive lifestyle modifications for her multiple health conditions.   ATTESTASTION STATEMENTS:  Reviewed by clinician on day of visit: allergies,  medications, problem list, medical history, surgical history, family history, social history, and previous encounter notes.   I have personally spent 45 minutes total time today in preparation, patient care, nutritional counseling and documentation for this visit, including the following: review of clinical lab tests; review of medical tests/procedures/services.      Arbie Reisz, PA-C

## 2022-12-25 NOTE — Assessment & Plan Note (Addendum)
Hypertension Hypertension well controlled and no significant medication side effects noted.  Medication(s): amlodipine 10 mg daily    HCTZ 25 mg daily      No symptoms of hypotension.  BP Readings from Last 3 Encounters:  12/25/22 114/79  12/03/22 127/83  11/26/22 118/83   Lab Results  Component Value Date   CREATININE 0.81 06/10/2022   CREATININE 0.90 07/24/2021   CREATININE 0.82 10/13/2019   No results found for: "GFR"  Plan: Continue all antihypertensives at current dosages.Continue to work on nutrition plan to promote weight loss and improve BP control. Monitor for hypotension as continues weight loss efforts.

## 2022-12-25 NOTE — Assessment & Plan Note (Addendum)
Prediabetes with Cravings.  Last A1c was 5.8  Medication(s): None Topamax 50 mg twice daily for cravings. No side effects with topamax. She is working on Camera operator to decrease simple carbohydrates, increase lean proteins and exercise to promote weight loss, improve glycemic control and prevent progression to Type 2 diabetes.   Lab Results  Component Value Date   HGBA1C 5.8 (H) 10/06/2022   HGBA1C 5.8 (H) 06/10/2022   HGBA1C 5.4 07/24/2021   HGBA1C 6.0 (A) 10/13/2019   HGBA1C 5.6 12/13/2015   Lab Results  Component Value Date   INSULIN 6.2 06/10/2022    Plan: Continue topamax for cravings.   Continue working on nutrition plan to decrease simple carbohydrates, increase lean proteins and exercise to promote weight loss, improve glycemic control and prevent progression to Type 2 diabetes.

## 2022-12-25 NOTE — Assessment & Plan Note (Signed)
Vitamin D Deficiency Vitamin D is at goal of 50.  Most recent vitamin D level was 52.8. PCP stopped Ergocalciferol 12/03/22 based on last labs. Will plan to follow up over the next 2 months. . Lab Results  Component Value Date   VD25OH 52.8 10/06/2022   VD25OH 19.8 (L) 06/10/2022    Plan: Continues multi vitamin daily at this point as Ergocalciferol discontinued at PCP visit 12/03/2022.  Will plan to recheck level over next 2-3 months.

## 2022-12-31 NOTE — Progress Notes (Signed)
Internal Medicine Clinic Attending ? ?Case discussed with Dr. Liang  At the time of the visit.  We reviewed the resident?s history and exam and pertinent patient test results.  I agree with the assessment, diagnosis, and plan of care documented in the resident?s note. ? ?

## 2023-01-01 DIAGNOSIS — M25561 Pain in right knee: Secondary | ICD-10-CM | POA: Diagnosis not present

## 2023-01-15 DIAGNOSIS — M25561 Pain in right knee: Secondary | ICD-10-CM | POA: Diagnosis not present

## 2023-01-19 ENCOUNTER — Ambulatory Visit (HOSPITAL_COMMUNITY): Payer: Self-pay | Admitting: Emergency Medicine

## 2023-01-19 DIAGNOSIS — G8929 Other chronic pain: Secondary | ICD-10-CM
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
## 2023-01-20 ENCOUNTER — Encounter (INDEPENDENT_AMBULATORY_CARE_PROVIDER_SITE_OTHER): Payer: Self-pay | Admitting: Physician Assistant

## 2023-01-20 ENCOUNTER — Ambulatory Visit (INDEPENDENT_AMBULATORY_CARE_PROVIDER_SITE_OTHER): Payer: Self-pay | Admitting: Physician Assistant

## 2023-01-20 ENCOUNTER — Other Ambulatory Visit: Payer: Self-pay

## 2023-01-20 VITALS — BP 131/85 | HR 66 | Temp 97.9°F | Ht 66.0 in | Wt 229.0 lb

## 2023-01-20 DIAGNOSIS — Z6837 Body mass index (BMI) 37.0-37.9, adult: Secondary | ICD-10-CM

## 2023-01-20 DIAGNOSIS — J4530 Mild persistent asthma, uncomplicated: Secondary | ICD-10-CM

## 2023-01-20 DIAGNOSIS — I10 Essential (primary) hypertension: Secondary | ICD-10-CM

## 2023-01-20 DIAGNOSIS — R7303 Prediabetes: Secondary | ICD-10-CM

## 2023-01-20 MED ORDER — ALBUTEROL SULFATE (2.5 MG/3ML) 0.083% IN NEBU
2.5000 mg | INHALATION_SOLUTION | RESPIRATORY_TRACT | 2 refills | Status: DC | PRN
Start: 2023-01-20 — End: 2023-06-12

## 2023-01-20 NOTE — Progress Notes (Signed)
Office: [phone]  /  Fax: [phone]  WEIGHT SUMMARY AND BIOMETRICS  Vitals Temp: 97.9 F (36.6 C) BP: 131/85 Pulse Rate: 66 SpO2: 100 %   Anthropometric Measurements Height:  (1.676 m) Weight: 229 lb (103.9 kg) BMI (Calculated): 36.98 Weight at Last Visit: 231lb Weight Lost Since Last Visit: 2 lb Weight Gained Since Last Visit: 0 lb Starting Weight: 262 lb Total Weight Loss (lbs): 33 lb (15 kg)   Body Composition  Body Fat %: 49.7 % Fat Mass (lbs): 49.7 lbs Muscle Mass (lbs): 109.6 lbs Total Body Water (lbs): 88.2 lbs Visceral Fat Rating : 14   Other Clinical Data Fasting: no Labs: No Today's Visit #: 10 Starting Date: 06/10/22     HPI  Chief Complaint: OBESITY  Lynn Bond is here to discuss her progress with her obesity treatment plan. She is on the the Category 3 Plan and states she is following her eating plan approximately 40 % of the time. She states she is exercising 20 minutes 3 times per week.   Interval History:  Since last office visit she is down another 2 pounds. She has had full dental extractions completed (in prep for knee replacement surgery) and has to wait approximately 3 months to have dentures prepared.  She has been doing well continuing to work on meeting her protein needs.  She is eating [place] yogurt which she has found surprisingly good, and eating baked fish and rotisserie chicken which she cuts up very finely. Hunger and appetite well-controlled overall. Her exercise remains limited by her arthritis-goes to [employer] and does water aerobics.   Scheduled to have knee replacement surgery on 02/09/2023. Postoperatively she her daughter will be staying with her during the daytime and her husband will be with her at night.  We discussed some strategies to avoid emotional/comfort eating in the postoperative period.  She has already purchased some crossword puzzle books, enjoys playing games and has bought [employer] to  make bracelets as she enjoys doing this.   Pharmacotherapy: Topamax for cravings associated with prediabetes. no side effects/ no refill needed at this visit  PHYSICAL EXAM:  Blood pressure 131/85, pulse 66, temperature 97.9 F (36.6 C), height  (1.676 m), weight 229 lb (103.9 kg), SpO2 100 %. Body mass index is 36.96 kg/m.  General: She is overweight, cooperative, alert, well developed, and in no acute distress. Antalgic appearing gait.  Cardiovascular: NR 60's regular PSYCH: Has normal mood, affect and thought process.   Lungs: Normal breathing effort, no conversational dyspnea.  DIAGNOSTIC DATA REVIEWED:  BMET    Component Value Date/Time   NA 142 06/10/2022 1214   K 3.8 06/10/2022 1214   CL 101 06/10/2022 1214   CO2 24 06/10/2022 1214   GLUCOSE 80 06/10/2022 1214   GLUCOSE 68 (L) 04/25/2014 1635   BUN 17 06/10/2022 1214   CREATININE 0.81 06/10/2022 1214   CREATININE 0.81 04/25/2014 1635   CALCIUM 9.6 06/10/2022 1214   GFRNONAA 83 10/13/2019 1419   GFRNONAA 76 10/11/2012 0917   GFRAA 95 10/13/2019 1419   GFRAA 88 10/11/2012 0917   Lab Results  Component Value Date   HGBA1C 5.8 (H) 10/06/2022   HGBA1C 5.8 01/28/2010   Lab Results  Component Value Date   INSULIN 6.2 06/10/2022   Lab Results  Component Value Date   TSH 1.290 06/10/2022   CBC    Component Value Date/Time   WBC 5.2 06/10/2022 1214   WBC 8.2 10/11/2012 0917  RBC 4.91 06/10/2022 1214   RBC 4.20 10/11/2012 0918   RBC 4.25 10/11/2012 0917   HGB 11.4 06/10/2022 1214   HCT 38.6 06/10/2022 1214   PLT 314 06/10/2022 1214   MCV 79 06/10/2022 1214   MCH 23.2 (L) 06/10/2022 1214   MCH 25.4 (L) 10/11/2012 0917   MCHC 29.5 (L) 06/10/2022 1214   MCHC 31.8 10/11/2012 0917   RDW 16.8 (H) 06/10/2022 1214   Iron Studies    Component Value Date/Time   IRON 30 (L) 10/11/2012 0918   TIBC 321 10/11/2012 0918   FERRITIN 22 10/11/2012 0918   IRONPCTSAT 9 (L) 10/11/2012 0918   Lipid Panel      Component Value Date/Time   CHOL 162 06/10/2022 1214   TRIG 49 06/10/2022 1214   HDL 60 06/10/2022 1214   CHOLHDL 2.7 06/10/2022 1214   CHOLHDL 2.9 10/11/2012 0917   VLDL 7 10/11/2012 0917   LDLCALC 92 06/10/2022 1214   Hepatic Function Panel     Component Value Date/Time   PROT 7.2 06/10/2022 1214   ALBUMIN 4.0 06/10/2022 1214   AST 11 06/10/2022 1214   ALT 11 06/10/2022 1214   ALKPHOS 71 06/10/2022 1214   BILITOT 0.4 06/10/2022 1214      Component Value Date/Time   TSH 1.290 06/10/2022 1214   Nutritional Lab Results  Component Value Date   VD25OH 52.8 10/06/2022   VD25OH 19.8 (L) 06/10/2022    ASSOCIATED CONDITIONS ADDRESSED TODAY  ASSESSMENT AND PLAN  Problem List Items Addressed This Visit     Pre-diabetes - Primary    Prediabetes with cravings.  Last A1c was 5.8/ Insulin 6.2  Medication(s): Topiramate 50 mg twice daily. No side effects. No refills needed- Filled by PCP.  Lab Results  Component Value Date   HGBA1C 5.8 (H) 10/06/2022   HGBA1C 5.8 (H) 06/10/2022   HGBA1C 5.4 07/24/2021   HGBA1C 6.0 (A) 10/13/2019   HGBA1C 5.6 12/13/2015   Lab Results  Component Value Date   INSULIN 6.2 06/10/2022   Plan: Continue Topiramate 50 mg twice daily. No refills needed.  Continue working on nutrition plan to decrease simple carbohydrates, increase lean proteins and exercise to promote weight loss, improve glycemic control and prevent progression to Type 2 diabetes.  Discussed strategies to avoid emotional eating/comfort eating in post operative period.  Will plan to see her back 1 more visit prior to surgery .        Morbid obesity (HCC)-starting bmi 42.29   Essential hypertension    Hypertension Hypertension improved, reasonably well controlled, and no significant medication side effects noted.  Medication(s): Amlodipine 10 mg daily   HCTZ 25 mg daily      BP Readings from Last 3 Encounters:  01/20/23 131/85  12/25/22 114/79  12/03/22 127/83   Lab  Results  Component Value Date   CREATININE 0.81 06/10/2022   CREATININE 0.90 07/24/2021   CREATININE 0.82 10/13/2019  No results found for: "GFR"  Plan: Continue all antihypertensives at current dosages. No refills required this visit.  Continue to work on nutrition plan to promote weight loss and improve BP control.         BMI 37.0-37.9, adult Current BMI 37.3   Current BMI 37.1. Down 33 lbs! TBW loss of 12.6% since 06/10/2022!! Has goal of getting under 200 following knee replacement surgery!  TREATMENT PLAN FOR OBESITY:  Recommended Dietary Goals  Lynn Bond is currently in the action stage of change. As such, her goal is to  continue weight management plan. She has agreed to the Category 3 Plan.  Behavioral Intervention  We discussed the following Behavioral Modification Strategies today: increasing lean protein intake, decreasing simple carbohydrates , increasing vegetables, increasing lower glycemic fruits, increasing water intake, continue to practice mindfulness when eating, and planning for success.  Additional resources provided today: NA  Recommended Physical Activity Goals  Lynn Bond has been advised to work up to 150 minutes of moderate intensity aerobic activity a week and strengthening exercises 2-3 times per week for cardiovascular health, weight loss maintenance and preservation of muscle mass.   She has agreed to Continue current level of physical activity    Pharmacotherapy We discussed various medication options to help Lynn Bond with her weight loss efforts and we both agreed to continue Topamax 50 mg twice daily for cravings and continue to work on nutritional and behavioral strategies to promote weight loss.  .    Return in about 2 weeks (around 02/03/2023).Marland Kitchen She was informed of the importance of frequent follow up visits to maximize her success with intensive lifestyle modifications for her multiple health conditions.   ATTESTASTION STATEMENTS:  Reviewed by  clinician on day of visit: allergies, medications, problem list, medical history, surgical history, family history, social history, and previous encounter notes.   I have personally spent 30 minutes total time today in preparation, patient care, nutritional counseling and documentation for this visit, including the following: review of clinical lab tests; review of medical tests/procedures/services.      Jolan Mealor, PA-C

## 2023-01-20 NOTE — Assessment & Plan Note (Addendum)
Prediabetes with cravings.  Last A1c was 5.8/ Insulin 6.2  Medication(s): Topiramate 50 mg twice daily. No side effects. No refills needed- Filled by PCP.  Lab Results  Component Value Date   HGBA1C 5.8 (H) 10/06/2022   HGBA1C 5.8 (H) 06/10/2022   HGBA1C 5.4 07/24/2021   HGBA1C 6.0 (A) 10/13/2019   HGBA1C 5.6 12/13/2015   Lab Results  Component Value Date   INSULIN 6.2 06/10/2022    Plan: Continue Topiramate 50 mg twice daily. No refills needed.  Continue working on nutrition plan to decrease simple carbohydrates, increase lean proteins and exercise to promote weight loss, improve glycemic control and prevent progression to Type 2 diabetes.  Discussed strategies to avoid emotional eating/comfort eating in post operative period.  Will plan to see her back 1 more visit prior to surgery .

## 2023-01-20 NOTE — Assessment & Plan Note (Addendum)
Hypertension Hypertension improved, reasonably well controlled, and no significant medication side effects noted.  Medication(s): Amlodipine 10 mg daily   HCTZ 25 mg daily      BP Readings from Last 3 Encounters:  01/20/23 131/85  12/25/22 114/79  12/03/22 127/83   Lab Results  Component Value Date   CREATININE 0.81 06/10/2022   CREATININE 0.90 07/24/2021   CREATININE 0.82 10/13/2019   No results found for: "GFR"  Plan: Continue all antihypertensives at current dosages. No refills required this visit.  Continue to work on nutrition plan to promote weight loss and improve BP control.

## 2023-01-23 ENCOUNTER — Telehealth: Payer: Self-pay

## 2023-01-23 DIAGNOSIS — I10 Essential (primary) hypertension: Secondary | ICD-10-CM

## 2023-01-23 NOTE — Telephone Encounter (Signed)
Requesting to speak with a nurse about getting blood pressure medication refill, pt do not know the name of the medication. Please call pt back.

## 2023-01-23 NOTE — Telephone Encounter (Addendum)
Amlodipine and HCTZ both sent on 3/6 for #90 with 2 refills. States she already received amlodipine. Notified her to call pharmacy for refill on HCTZ.

## 2023-01-28 NOTE — Patient Instructions (Signed)
DUE TO COVID-19 ONLY TWO VISITORS  (aged 56 and older)  ARE ALLOWED TO COME WITH YOU AND STAY IN THE WAITING ROOM ONLY DURING PRE OP AND PROCEDURE.   **NO VISITORS ARE ALLOWED IN THE SHORT STAY AREA OR RECOVERY ROOM!!**  IF YOU WILL BE ADMITTED INTO THE HOSPITAL YOU ARE ALLOWED ONLY FOUR SUPPORT PEOPLE DURING VISITATION HOURS ONLY (7 AM -8PM)   The support person(s) must pass our screening, gel in and out, and wear a mask at all times, including in the patient's room. Patients must also wear a mask when staff or their support person are in the room. Visitors GUEST BADGE MUST BE WORN VISIBLY  One adult visitor may remain with you overnight and MUST be in the room by 8 P.M.     Your procedure is scheduled on: 02/09/23   Report to [facility] Main Entrance    Report to admitting at : 7:30 AM   Call this number if you have problems the morning of surgery [phone]   Do not eat food :After Midnight.   After Midnight you may have the following liquids until : 7:00 AM DAY OF SURGERY  Water Black Coffee (sugar ok, NO MILK/CREAM OR CREAMERS)  Tea (sugar ok, NO MILK/CREAM OR CREAMERS) regular and decaf                             Plain Jell-O (NO RED)                                           Fruit ices (not with fruit pulp, NO RED)                                     Popsicles (NO RED)                                                                  Juice: apple, WHITE grape, WHITE cranberry Sports drinks like Gatorade (NO RED)   The day of surgery:  Drink ONE (1) Pre-Surgery Clear G2 at : 7:00 AM the morning of surgery. Drink in one sitting. Do not sip.  This drink was given to you during your hospital  pre-op appointment visit. Nothing else to drink after completing the  Pre-Surgery Clear Ensure or G2.          If you have questions, please contact your surgeon's office.   Oral Hygiene is also important to reduce your risk of infection.                                     Remember - BRUSH YOUR TEETH THE MORNING OF SURGERY WITH YOUR REGULAR TOOTHPASTE  DENTURES WILL BE REMOVED PRIOR TO SURGERY PLEASE DO NOT APPLY "Poly grip" OR ADHESIVES!!!   Do NOT smoke after Midnight   Take these medicines the morning of surgery with A SIP OF WATER: topiramate,fexofenadine,amlodipine.Use inhalers as usual.Tylenol as needed.  DO  NOT TAKE ANY ORAL DIABETIC MEDICATIONS DAY OF YOUR SURGERY                              You may not have any metal on your body including hair pins, jewelry, and body piercing             Do not wear make-up, lotions, powders, perfumes/cologne, or deodorant  Do not wear nail polish including gel and S&S, artificial/acrylic nails, or any other type of covering on natural nails including finger and toenails. If you have artificial nails, gel coating, etc. that needs to be removed by a nail salon please have this removed prior to surgery or surgery may need to be canceled/ delayed if the surgeon/ anesthesia feels like they are unable to be safely monitored.   Do not shave  48 hours prior to surgery.    Do not bring valuables to the hospital. [facility] IS NOT             RESPONSIBLE   FOR VALUABLES.   Contacts, glasses, or bridgework may not be worn into surgery.   Bring small overnight bag day of surgery.   DO NOT BRING YOUR HOME MEDICATIONS TO THE HOSPITAL. PHARMACY WILL DISPENSE MEDICATIONS LISTED ON YOUR MEDICATION LIST TO YOU DURING YOUR ADMISSION IN THE HOSPITAL!    Patients discharged on the day of surgery will not be allowed to drive home.  Someone NEEDS to stay with you for the first 24 hours after anesthesia.   Special Instructions: Bring a copy of your healthcare power of attorney and living will documents         the day of surgery if you haven't scanned them before.              Please read over the following fact sheets you were given: IF YOU HAVE QUESTIONS ABOUT YOUR PRE-OP INSTRUCTIONS PLEASE CALL [phone]       Incentive Spirometer  An incentive spirometer is a tool that can help keep your lungs clear and active. This tool measures how well you are filling your lungs with each breath. Taking long deep breaths may help reverse or decrease the chance of developing breathing (pulmonary) problems (especially infection) following: A long period of time when you are unable to move or be active. BEFORE THE PROCEDURE  If the spirometer includes an indicator to show your best effort, your nurse or respiratory therapist will set it to a desired goal. If possible, sit up straight or lean slightly forward. Try not to slouch. Hold the incentive spirometer in an upright position. INSTRUCTIONS FOR USE  Sit on the edge of your bed if possible, or sit up as far as you can in bed or on a chair. Hold the incentive spirometer in an upright position. Breathe out normally. Place the mouthpiece in your mouth and seal your lips tightly around it. Breathe in slowly and as deeply as possible, raising the piston or the ball toward the top of the column. Hold your breath for 3-5 seconds or for as long as possible. Allow the piston or ball to fall to the bottom of the column. Remove the mouthpiece from your mouth and breathe out normally. Rest for a few seconds and repeat Steps 1 through 7 at least 10 times every 1-2 hours when you are awake. Take your time and take a few normal breaths between deep breaths. The spirometer may include an  indicator to show your best effort. Use the indicator as a goal to work toward during each repetition. After each set of 10 deep breaths, practice coughing to be sure your lungs are clear. If you have an incision (the cut made at the time of surgery), support your incision when coughing by placing a pillow or rolled up towels firmly against it. Once you are able to get out of bed, walk around indoors and cough well. You may stop using the incentive spirometer when instructed by your caregiver.   RISKS AND COMPLICATIONS Take your time so you do not get dizzy or light-headed. If you are in pain, you may need to take or ask for pain medication before doing incentive spirometry. It is harder to take a deep breath if you are having pain. AFTER USE Rest and breathe slowly and easily. It can be helpful to keep track of a log of your progress. Your caregiver can provide you with a simple table to help with this. If you are using the spirometer at home, follow these instructions: SEEK MEDICAL CARE IF:  You are having difficultly using the spirometer. You have trouble using the spirometer as often as instructed. Your pain medication is not giving enough relief while using the spirometer. You develop fever of 100.5 F (38.1 C) or higher. SEEK IMMEDIATE MEDICAL CARE IF:  You cough up bloody sputum that had not been present before. You develop fever of 102 F (38.9 C) or greater. You develop worsening pain at or near the incision site. MAKE SURE YOU:  Understand these instructions. Will watch your condition. Will get help right away if you are not doing well or get worse. Document Released: 01/26/2007 Document Revised: 12/08/2011 Document Reviewed: 03/29/2007 [facility] Patient Information 2014 [place], [place].   ________________________________________________________________________

## 2023-01-29 ENCOUNTER — Encounter (HOSPITAL_COMMUNITY): Payer: Self-pay | Admitting: *Deleted

## 2023-01-29 ENCOUNTER — Encounter (HOSPITAL_COMMUNITY)
Admission: RE | Admit: 2023-01-29 | Discharge: 2023-01-29 | Disposition: A | Discharge status: Home / Self Care | Payer: Commercial Managed Care - HMO | Source: Ambulatory Visit | Attending: Orthopedic Surgery | Admitting: Orthopedic Surgery

## 2023-01-29 ENCOUNTER — Other Ambulatory Visit: Payer: Self-pay

## 2023-01-29 VITALS — BP 127/89 | HR 69 | Temp 97.9°F | Ht 66.0 in | Wt 226.0 lb

## 2023-01-29 DIAGNOSIS — Z01812 Encounter for preprocedural laboratory examination: Secondary | ICD-10-CM | POA: Insufficient documentation

## 2023-01-29 DIAGNOSIS — R7303 Prediabetes: Secondary | ICD-10-CM | POA: Insufficient documentation

## 2023-01-29 DIAGNOSIS — Z01818 Encounter for other preprocedural examination: Secondary | ICD-10-CM

## 2023-01-29 DIAGNOSIS — G8929 Other chronic pain: Secondary | ICD-10-CM

## 2023-01-29 DIAGNOSIS — M25561 Pain in right knee: Secondary | ICD-10-CM | POA: Diagnosis not present

## 2023-01-29 HISTORY — DX: Unspecified osteoarthritis, unspecified site: M19.90

## 2023-01-29 HISTORY — DX: Prediabetes: R73.03

## 2023-01-29 LAB — CBC WITH DIFFERENTIAL/PLATELET
Abs Immature Granulocytes: 0.02 10*3/uL (ref 0.00–0.07)
Basophils Absolute: 0 10*3/uL (ref 0.0–0.1)
Basophils Relative: 1 %
Eosinophils Absolute: 0.2 10*3/uL (ref 0.0–0.5)
Eosinophils Relative: 4 %
HCT: 40.3 % (ref 36.0–46.0)
Hemoglobin: 12.5 g/dL (ref 12.0–15.0)
Immature Granulocytes: 0 %
Lymphocytes Relative: 26 %
Lymphs Abs: 1.6 10*3/uL (ref 0.7–4.0)
MCH: 25.4 pg — ABNORMAL LOW (ref 26.0–34.0)
MCHC: 31 g/dL (ref 30.0–36.0)
MCV: 81.7 fL (ref 80.0–100.0)
Monocytes Absolute: 0.5 10*3/uL (ref 0.1–1.0)
Monocytes Relative: 9 %
Neutro Abs: 3.7 10*3/uL (ref 1.7–7.7)
Neutrophils Relative %: 60 %
Platelets: 320 10*3/uL (ref 150–400)
RBC: 4.93 MIL/uL (ref 3.87–5.11)
RDW: 17.5 % — ABNORMAL HIGH (ref 11.5–15.5)
WBC: 6.1 10*3/uL (ref 4.0–10.5)
nRBC: 0 % (ref 0.0–0.2)

## 2023-01-29 LAB — COMPREHENSIVE METABOLIC PANEL
ALT: 12 U/L (ref 0–44)
AST: 13 U/L — ABNORMAL LOW (ref 15–41)
Albumin: 3.8 g/dL (ref 3.5–5.0)
Alkaline Phosphatase: 50 U/L (ref 38–126)
Anion gap: 9 (ref 5–15)
BUN: 18 mg/dL (ref 6–20)
CO2: 26 mmol/L (ref 22–32)
Calcium: 9 mg/dL (ref 8.9–10.3)
Chloride: 102 mmol/L (ref 98–111)
Creatinine, Ser: 0.83 mg/dL (ref 0.44–1.00)
GFR, Estimated: >60 mL/min (ref >60)
Glucose, Bld: 92 mg/dL (ref 70–99)
Potassium: 3.3 mmol/L — ABNORMAL LOW (ref 3.5–5.1)
Sodium: 137 mmol/L (ref 135–145)
Total Bilirubin: 0.9 mg/dL (ref 0.3–1.2)
Total Protein: 8.1 g/dL (ref 6.5–8.1)

## 2023-01-29 LAB — GLUCOSE, CAPILLARY: Glucose-Capillary: 106 mg/dL — ABNORMAL HIGH (ref 70–99)

## 2023-01-29 LAB — HEMOGLOBIN A1C
Hgb A1c MFr Bld: 5.6 % (ref 4.8–5.6)
Mean Plasma Glucose: 114.02 mg/dL

## 2023-01-29 LAB — TYPE AND SCREEN
ABO/RH(D): O POS
Antibody Screen: NEGATIVE

## 2023-01-29 NOTE — Progress Notes (Addendum)
For Short Stay: COVID SWAB appointment date:  Bowel Prep reminder: N/A   For Anesthesia: PCP - DO: Doran Stabler Cardiologist -   Chest x-ray -  EKG - 06/10/22 Stress Test -  ECHO -  Cardiac Cath -  Pacemaker/ICD device last checked: Pacemaker orders received: Device Rep notified:  Spinal Cord Stimulator: N/A  Sleep Study - N/A CPAP -   Fasting Blood Sugar - N/A Checks Blood Sugar ___0__ times a day Date and result of last Hgb A1c-  Last dose of GLP1 agonist- N/A GLP1 instructions:   Last dose of SGLT-2 inhibitors-  SGLT-2 instructions:   Blood Thinner Instructions: N/A Aspirin Instructions: Last Dose:  Activity level: Can go up a flight of stairs and activities of daily living without stopping and without chest pain and/or shortness of breath   Able to exercise without chest pain and/or shortness of breath   Unable to go up a flight of stairs without chest pain and/or shortness of breath     Anesthesia review: Hx: HTN,Pre-DIA.  Patient denies shortness of breath, fever, cough and chest pain at PAT appointment   Patient verbalized understanding of instructions that were given to them at the PAT appointment. Patient was also instructed that they will need to review over the PAT instructions again at home before surgery.

## 2023-01-30 LAB — SURGICAL PCR SCREEN
MRSA, PCR: POSITIVE — AB
Staphylococcus aureus: POSITIVE — AB

## 2023-01-30 NOTE — Progress Notes (Signed)
PCR: + MRSA, + STAPH. 

## 2023-02-05 ENCOUNTER — Encounter (INDEPENDENT_AMBULATORY_CARE_PROVIDER_SITE_OTHER): Payer: Self-pay | Admitting: Physician Assistant

## 2023-02-05 ENCOUNTER — Ambulatory Visit (INDEPENDENT_AMBULATORY_CARE_PROVIDER_SITE_OTHER): Payer: Commercial Managed Care - HMO | Admitting: Physician Assistant

## 2023-02-05 VITALS — BP 116/81 | HR 85 | Temp 97.8°F | Ht 66.0 in | Wt 223.0 lb

## 2023-02-05 DIAGNOSIS — R7303 Prediabetes: Secondary | ICD-10-CM | POA: Diagnosis not present

## 2023-02-05 DIAGNOSIS — M1711 Unilateral primary osteoarthritis, right knee: Secondary | ICD-10-CM

## 2023-02-05 DIAGNOSIS — Z6836 Body mass index (BMI) 36.0-36.9, adult: Secondary | ICD-10-CM

## 2023-02-05 DIAGNOSIS — Z6835 Body mass index (BMI) 35.0-35.9, adult: Secondary | ICD-10-CM

## 2023-02-05 DIAGNOSIS — I10 Essential (primary) hypertension: Secondary | ICD-10-CM | POA: Diagnosis not present

## 2023-02-05 NOTE — Progress Notes (Signed)
.[facility]  Office: [phone]  /  Fax: [phone]  WEIGHT SUMMARY AND BIOMETRICS  Vitals Temp: 97.8 F (36.6 C) BP: 116/81 Pulse Rate: 85 SpO2: 100 %   Anthropometric Measurements Height: 5\' 6"  (1.676 m) Weight: 223 lb (101.2 kg) BMI (Calculated): 36.01 Weight at Last Visit: 229lb Weight Lost Since Last Visit: 6lb Weight Gained Since Last Visit: 0 Starting Weight: 262lb Total Weight Loss (lbs): 39 lb (17.7 kg)   Body Composition  Body Fat %: 47.2 % Fat Mass (lbs): 105.2 lbs Muscle Mass (lbs): 111.8 lbs Total Body Water (lbs): 83.4 lbs Visceral Fat Rating : 13   Other Clinical Data Fasting: no Labs: no Today's Visit #: 11 Starting Date: 06/10/22     HPI  Chief Complaint: OBESITY  Lynn Bond is here to discuss her progress with her obesity treatment plan. She is on the the Category 3 Plan and states she is following her eating plan approximately 50 % of the time. She states she is exercising 0 minutes 0 times per week.   Interval History:  Since last office visit she down 6 lbs! Total of 39 lbs since 05/2022!! Current BMI 36.0 TBW loss of 14.9%  Having Right Total knee replacement surgery 02/09/2023-  Hunger/appetite-well controlled. Getting adequate protein.  Cravings- not excessive. Does not eat anything after 7 pm nightly. This is one of the biggest lifestyle changes she has implemented.  Stress- manageable . Appropriately anxious about upcoming surgery  Energy level- much improved overall.  Exercise-walking track at [employer] - 3 laps (0.75 miles)  with daughter but not able to exercise easily with knee pain.  Hydration-drinks almost only water, no sugar sweetened beverages. Some zero sugar at times.    Pharmacotherapy: Topamax started by PCP for cravings associated with prediabetes. No side effects.   TREATMENT PLAN FOR OBESITY:  Recommended Dietary Goals  Lynn Bond is currently in the action stage of change. As such, her goal is to continue weight management  plan. She has agreed to the Category 3 Plan.  Behavioral Intervention  We discussed the following Behavioral Modification Strategies today: increasing lean protein intake, decreasing simple carbohydrates , increasing vegetables, increasing lower glycemic fruits, increasing water intake, keeping healthy foods at home, emotional eating strategies and understanding the difference between hunger signals and cravings, continue to practice mindfulness when eating, and planning for success.  Additional resources provided today: NA  Recommended Physical Activity Goals  Lynn Bond has been advised to work up to 150 minutes of moderate intensity aerobic activity a week and strengthening exercises 2-3 times per week for cardiovascular health, weight loss maintenance and preservation of muscle mass.   She has agreed to Continue current level of physical activity    Pharmacotherapy We discussed various medication options to help Lynn Bond with her weight loss efforts and we both agreed to continue to work on nutritional and behavioral strategies to promote weight loss.   She is going to have right total knee replacement surgery 02/09/23 and will need to follow up in 4 weeks. We discussed can do video visit if desired or has concerns in the interim .    Return in about 4 weeks (around 03/05/2023).Marland Kitchen She was informed of the importance of frequent follow up visits to maximize her success with intensive lifestyle modifications for her multiple health conditions.  PHYSICAL EXAM:  Blood pressure 116/81, pulse 85, temperature 97.8 F (36.6 C), height 5\' 6"  (1.676 m), weight 223 lb (101.2 kg), last menstrual period 11/01/2019, SpO2 100 %. Body mass index is  35.99 kg/m.  General: She is overweight, cooperative, alert, well developed, and in no acute distress. Walks with antalgic appearing gait.  PSYCH: Has normal mood, affect and thought process.   Cardiovascular: HR 80's regular Lungs: Normal breathing effort, no  conversational dyspnea. Neuro: no focal deficits.   DIAGNOSTIC DATA REVIEWED:  BMET    Component Value Date/Time   NA 137 01/29/2023 1134   NA 142 06/10/2022 1214   K 3.3 (L) 01/29/2023 1134   CL 102 01/29/2023 1134   CO2 26 01/29/2023 1134   GLUCOSE 92 01/29/2023 1134   BUN 18 01/29/2023 1134   BUN 17 06/10/2022 1214   CREATININE 0.83 01/29/2023 1134   CREATININE 0.81 04/25/2014 1635   CALCIUM 9.0 01/29/2023 1134   GFRNONAA >60 01/29/2023 1134   GFRNONAA 76 10/11/2012 0917   GFRAA 95 10/13/2019 1419   GFRAA 88 10/11/2012 0917   Lab Results  Component Value Date   HGBA1C 5.6 01/29/2023   HGBA1C 5.8 01/28/2010   Lab Results  Component Value Date   INSULIN 6.2 06/10/2022   Lab Results  Component Value Date   TSH 1.290 06/10/2022   CBC    Component Value Date/Time   WBC 6.1 01/29/2023 1134   RBC 4.93 01/29/2023 1134   HGB 12.5 01/29/2023 1134   HGB 11.4 06/10/2022 1214   HCT 40.3 01/29/2023 1134   HCT 38.6 06/10/2022 1214   PLT 320 01/29/2023 1134   PLT 314 06/10/2022 1214   MCV 81.7 01/29/2023 1134   MCV 79 06/10/2022 1214   MCH 25.4 (L) 01/29/2023 1134   MCHC 31.0 01/29/2023 1134   RDW 17.5 (H) 01/29/2023 1134   RDW 16.8 (H) 06/10/2022 1214   Iron Studies    Component Value Date/Time   IRON 30 (L) 10/11/2012 0918   TIBC 321 10/11/2012 0918   FERRITIN 22 10/11/2012 0918   IRONPCTSAT 9 (L) 10/11/2012 0918   Lipid Panel     Component Value Date/Time   CHOL 162 06/10/2022 1214   TRIG 49 06/10/2022 1214   HDL 60 06/10/2022 1214   CHOLHDL 2.7 06/10/2022 1214   CHOLHDL 2.9 10/11/2012 0917   VLDL 7 10/11/2012 0917   LDLCALC 92 06/10/2022 1214   Hepatic Function Panel     Component Value Date/Time   PROT 8.1 01/29/2023 1134   PROT 7.2 06/10/2022 1214   ALBUMIN 3.8 01/29/2023 1134   ALBUMIN 4.0 06/10/2022 1214   AST 13 (L) 01/29/2023 1134   ALT 12 01/29/2023 1134   ALKPHOS 50 01/29/2023 1134   BILITOT 0.9 01/29/2023 1134   BILITOT 0.4  06/10/2022 1214      Component Value Date/Time   TSH 1.290 06/10/2022 1214   Nutritional Lab Results  Component Value Date   VD25OH 52.8 10/06/2022   VD25OH 19.8 (L) 06/10/2022    ASSOCIATED CONDITIONS ADDRESSED TODAY  ASSESSMENT AND PLAN  Problem List Items Addressed This Visit     Osteoarthritis of right knee   Pre-diabetes - Primary   Morbid obesity (HCC)-starting bmi 42.29   Essential hypertension   Other Visit Diagnoses     BMI 36.0-36.9,adult Current BMI 36.0           Osteoarthritis of right knee For TKA 02/09/2023 Plan: Follow post operative progress. Strategies to stay on track with nutrition including making sure meeting protein needs, maintaining hydration and strategies to avoid boredom/avoid emotional/comfort eating in the postoperative period. She has already purchased some crossword puzzle books, enjoys playing games and has  bought [employer] to make bracelets as she enjoys doing this.   Prediabetes Last A1c was 5.6/ INsulin 6.2- nearing goals.  She is working on Engineer, technical sales to decrease simple carbohydrates, increase lean proteins and exercise to promote weight loss, improve glycemic control and prevent progression to Type 2 diabetes.    Medication(s): None Lab Results  Component Value Date   HGBA1C 5.6 01/29/2023   HGBA1C 5.8 (H) 10/06/2022   HGBA1C 5.8 (H) 06/10/2022   HGBA1C 5.4 07/24/2021   HGBA1C 6.0 (A) 10/13/2019   Lab Results  Component Value Date   INSULIN 6.2 06/10/2022    Plan:  Continue working on nutrition plan to decrease simple carbohydrates, increase lean proteins and exercise to promote weight loss, improve glycemic control and prevent progression to Type 2 diabetes.  Will plan to recheck labs once recovering well following surgery.   Hypertension Hypertension well controlled, improved, no significant medication side effects noted, and needs further observation.  Medication(s): HCTZ 25 mg daily    Norvasc 10 mg  daily  BP Readings from Last 3 Encounters:  02/05/23 116/81  01/29/23 127/89  01/20/23 131/85   Lab Results  Component Value Date   CREATININE 0.83 01/29/2023   CREATININE 0.81 06/10/2022   CREATININE 0.90 07/24/2021   No results found for: "GFR"  Plan: Continue all antihypertensives at current dosages. Will need to monitor closely with continued weight loss. May be able to decrease/discontinue medication over time as continues to progress with weight loss.  Continue to work on nutrition plan to promote weight loss and improve BP control.     ATTESTASTION STATEMENTS:  Reviewed by clinician on day of visit: allergies, medications, problem list, medical history, surgical history, family history, social history, and previous encounter notes.   I have personally spent 44 minutes total time today in preparation, patient care, nutritional counseling and documentation for this visit, including the following: review of clinical lab tests; review of medical tests/procedures/services.      Mackenzie Groom, PA-C

## 2023-02-09 ENCOUNTER — Other Ambulatory Visit: Payer: Self-pay

## 2023-02-09 ENCOUNTER — Ambulatory Visit (HOSPITAL_COMMUNITY): Payer: Commercial Managed Care - HMO

## 2023-02-09 ENCOUNTER — Encounter (HOSPITAL_COMMUNITY)
Admission: RE | Disposition: A | Discharge status: Home / Self Care | Payer: Self-pay | Source: Home / Self Care | Attending: Orthopedic Surgery

## 2023-02-09 ENCOUNTER — Ambulatory Visit (HOSPITAL_COMMUNITY)
Admission: RE | Admit: 2023-02-09 | Discharge: 2023-02-09 | Disposition: A | Discharge status: Home / Self Care | Payer: Commercial Managed Care - HMO | Attending: Orthopedic Surgery | Admitting: Orthopedic Surgery

## 2023-02-09 ENCOUNTER — Ambulatory Visit (HOSPITAL_COMMUNITY): Payer: Commercial Managed Care - HMO | Admitting: Certified Registered"

## 2023-02-09 ENCOUNTER — Ambulatory Visit (HOSPITAL_BASED_OUTPATIENT_CLINIC_OR_DEPARTMENT_OTHER): Payer: Commercial Managed Care - HMO | Admitting: Certified Registered"

## 2023-02-09 ENCOUNTER — Encounter (HOSPITAL_COMMUNITY): Payer: Self-pay | Admitting: Orthopedic Surgery

## 2023-02-09 DIAGNOSIS — I10 Essential (primary) hypertension: Secondary | ICD-10-CM

## 2023-02-09 DIAGNOSIS — M1711 Unilateral primary osteoarthritis, right knee: Secondary | ICD-10-CM

## 2023-02-09 DIAGNOSIS — Z87891 Personal history of nicotine dependence: Secondary | ICD-10-CM

## 2023-02-09 DIAGNOSIS — Z471 Aftercare following joint replacement surgery: Secondary | ICD-10-CM | POA: Diagnosis not present

## 2023-02-09 DIAGNOSIS — J45909 Unspecified asthma, uncomplicated: Secondary | ICD-10-CM | POA: Diagnosis not present

## 2023-02-09 DIAGNOSIS — M170 Bilateral primary osteoarthritis of knee: Secondary | ICD-10-CM | POA: Diagnosis not present

## 2023-02-09 DIAGNOSIS — R7303 Prediabetes: Secondary | ICD-10-CM | POA: Insufficient documentation

## 2023-02-09 DIAGNOSIS — K219 Gastro-esophageal reflux disease without esophagitis: Secondary | ICD-10-CM | POA: Diagnosis not present

## 2023-02-09 DIAGNOSIS — Z96651 Presence of right artificial knee joint: Secondary | ICD-10-CM | POA: Diagnosis not present

## 2023-02-09 DIAGNOSIS — Z79899 Other long term (current) drug therapy: Secondary | ICD-10-CM | POA: Insufficient documentation

## 2023-02-09 DIAGNOSIS — Z6841 Body Mass Index (BMI) 40.0 and over, adult: Secondary | ICD-10-CM | POA: Insufficient documentation

## 2023-02-09 HISTORY — PX: TOTAL KNEE ARTHROPLASTY: SHX125

## 2023-02-09 LAB — ABO/RH: ABO/RH(D): O POS

## 2023-02-09 SURGERY — ARTHROPLASTY, KNEE, TOTAL
Anesthesia: Spinal | Site: Knee | Laterality: Right

## 2023-02-09 MED ORDER — ONDANSETRON HCL 4 MG PO TABS: 4.0000 mg | ORAL_TABLET | Freq: Four times a day (QID) | ORAL | Status: DC | PRN

## 2023-02-09 MED ORDER — ACETAMINOPHEN 160 MG/5ML PO SOLN: 325.0000 mg | Freq: Once | ORAL | Status: DC | PRN

## 2023-02-09 MED ORDER — DEXAMETHASONE SODIUM PHOSPHATE 10 MG/ML IJ SOLN
INTRAMUSCULAR | Status: AC
  Filled 2023-02-09: qty 2

## 2023-02-09 MED ORDER — PROPOFOL 10 MG/ML IV BOLUS
INTRAVENOUS | Status: DC | PRN
  Administered 2023-02-09: 20 mg via INTRAVENOUS

## 2023-02-09 MED ORDER — TRANEXAMIC ACID-NACL 1000-0.7 MG/100ML-% IV SOLN
1000.0000 mg | INTRAVENOUS | Status: AC
  Administered 2023-02-09: 1000 mg via INTRAVENOUS
  Filled 2023-02-09: qty 100

## 2023-02-09 MED ORDER — SODIUM CHLORIDE 0.9 % IV SOLN: INTRAVENOUS | Status: DC

## 2023-02-09 MED ORDER — ACETAMINOPHEN 10 MG/ML IV SOLN
1000.0000 mg | Freq: Once | INTRAVENOUS | Status: DC | PRN
  Administered 2023-02-09: 1000 mg via INTRAVENOUS

## 2023-02-09 MED ORDER — PROPOFOL 500 MG/50ML IV EMUL
INTRAVENOUS | Status: DC | PRN
  Administered 2023-02-09: 100 ug/kg/min via INTRAVENOUS

## 2023-02-09 MED ORDER — ONDANSETRON HCL 4 MG/2ML IJ SOLN: 4.0000 mg | Freq: Four times a day (QID) | INTRAMUSCULAR | Status: DC | PRN

## 2023-02-09 MED ORDER — METHOCARBAMOL 500 MG PO TABS
500.0000 mg | ORAL_TABLET | Freq: Four times a day (QID) | ORAL | Status: DC | PRN
  Administered 2023-02-09: 500 mg via ORAL

## 2023-02-09 MED ORDER — KETOROLAC TROMETHAMINE 15 MG/ML IJ SOLN
15.0000 mg | Freq: Four times a day (QID) | INTRAMUSCULAR | Status: DC
  Administered 2023-02-09: 15 mg via INTRAVENOUS

## 2023-02-09 MED ORDER — BUPIVACAINE LIPOSOME 1.3 % IJ SUSP: 20.0000 mL | Freq: Once | INTRAMUSCULAR | Status: DC

## 2023-02-09 MED ORDER — ONDANSETRON HCL 4 MG/2ML IJ SOLN
INTRAMUSCULAR | Status: AC
  Filled 2023-02-09: qty 4

## 2023-02-09 MED ORDER — ACETAMINOPHEN 325 MG PO TABS: 325.0000 mg | ORAL_TABLET | Freq: Once | ORAL | Status: DC | PRN

## 2023-02-09 MED ORDER — AMISULPRIDE (ANTIEMETIC) 5 MG/2ML IV SOLN: 10.0000 mg | Freq: Once | INTRAVENOUS | Status: DC | PRN

## 2023-02-09 MED ORDER — CEFAZOLIN SODIUM-DEXTROSE 2-4 GM/100ML-% IV SOLN: 2.0000 g | Freq: Four times a day (QID) | INTRAVENOUS | Status: DC

## 2023-02-09 MED ORDER — ROCURONIUM BROMIDE 10 MG/ML (PF) SYRINGE
PREFILLED_SYRINGE | INTRAVENOUS | Status: AC
  Filled 2023-02-09: qty 10

## 2023-02-09 MED ORDER — CHLORHEXIDINE GLUCONATE 4 % EX SOLN
1.0000 | CUTANEOUS | 1 refills | Status: DC
Start: 2023-02-09 — End: 2023-07-30

## 2023-02-09 MED ORDER — CHLORHEXIDINE GLUCONATE 0.12 % MT SOLN
15.0000 mL | Freq: Once | OROMUCOSAL | Status: AC
  Administered 2023-02-09: 15 mL via OROMUCOSAL

## 2023-02-09 MED ORDER — MIDAZOLAM HCL 2 MG/2ML IJ SOLN
INTRAMUSCULAR | Status: DC | PRN
  Administered 2023-02-09 (×2): 1 mg via INTRAVENOUS

## 2023-02-09 MED ORDER — PROMETHAZINE HCL 25 MG/ML IJ SOLN: 6.2500 mg | INTRAMUSCULAR | Status: DC | PRN

## 2023-02-09 MED ORDER — LACTATED RINGERS IV SOLN: INTRAVENOUS | Status: DC

## 2023-02-09 MED ORDER — METHOCARBAMOL 500 MG PO TABS
ORAL_TABLET | ORAL | Status: AC
  Filled 2023-02-09: qty 1

## 2023-02-09 MED ORDER — BUPIVACAINE IN DEXTROSE 0.75-8.25 % IT SOLN
INTRATHECAL | Status: DC | PRN
  Administered 2023-02-09: 2 mL via INTRATHECAL

## 2023-02-09 MED ORDER — MEPERIDINE HCL 50 MG/ML IJ SOLN: 6.2500 mg | INTRAMUSCULAR | Status: DC | PRN

## 2023-02-09 MED ORDER — PROPOFOL 500 MG/50ML IV EMUL
INTRAVENOUS | Status: AC
  Filled 2023-02-09: qty 50

## 2023-02-09 MED ORDER — CEFAZOLIN SODIUM-DEXTROSE 2-4 GM/100ML-% IV SOLN
2.0000 g | INTRAVENOUS | Status: AC
  Administered 2023-02-09: 2 g via INTRAVENOUS
  Filled 2023-02-09: qty 100

## 2023-02-09 MED ORDER — WATER FOR IRRIGATION, STERILE IR SOLN
Status: DC | PRN
  Administered 2023-02-09 (×2): 1000 mL

## 2023-02-09 MED ORDER — ACETAMINOPHEN 10 MG/ML IV SOLN
INTRAVENOUS | Status: AC
  Filled 2023-02-09: qty 100

## 2023-02-09 MED ORDER — 0.9 % SODIUM CHLORIDE (POUR BTL) OPTIME
TOPICAL | Status: DC | PRN
  Administered 2023-02-09: 1000 mL

## 2023-02-09 MED ORDER — FENTANYL CITRATE (PF) 100 MCG/2ML IJ SOLN
INTRAMUSCULAR | Status: DC | PRN
  Administered 2023-02-09: 50 ug via INTRAVENOUS

## 2023-02-09 MED ORDER — PHENYLEPHRINE HCL-NACL 20-0.9 MG/250ML-% IV SOLN
INTRAVENOUS | Status: DC | PRN
  Administered 2023-02-09: 25 ug/min via INTRAVENOUS

## 2023-02-09 MED ORDER — SODIUM CHLORIDE (PF) 0.9 % IJ SOLN
INTRAMUSCULAR | Status: AC
  Filled 2023-02-09: qty 50

## 2023-02-09 MED ORDER — ONDANSETRON HCL 4 MG/2ML IJ SOLN
INTRAMUSCULAR | Status: DC | PRN
  Administered 2023-02-09: 4 mg via INTRAVENOUS

## 2023-02-09 MED ORDER — SODIUM CHLORIDE (PF) 0.9 % IJ SOLN
INTRAMUSCULAR | Status: AC
  Filled 2023-02-09: qty 10

## 2023-02-09 MED ORDER — SODIUM CHLORIDE 0.9 % IR SOLN
Status: DC | PRN
  Administered 2023-02-09: 3000 mL

## 2023-02-09 MED ORDER — ORAL CARE MOUTH RINSE: 15.0000 mL | Freq: Once | OROMUCOSAL | Status: AC

## 2023-02-09 MED ORDER — DEXAMETHASONE SODIUM PHOSPHATE 10 MG/ML IJ SOLN
INTRAMUSCULAR | Status: DC | PRN
  Administered 2023-02-09: 8 mg via INTRAVENOUS

## 2023-02-09 MED ORDER — VANCOMYCIN HCL IN DEXTROSE 1-5 GM/200ML-% IV SOLN
1000.0000 mg | Freq: Once | INTRAVENOUS | Status: AC
  Administered 2023-02-09: 1000 mg via INTRAVENOUS
  Filled 2023-02-09: qty 200

## 2023-02-09 MED ORDER — DEXAMETHASONE SODIUM PHOSPHATE 10 MG/ML IJ SOLN: 8.0000 mg | Freq: Once | INTRAMUSCULAR | Status: DC

## 2023-02-09 MED ORDER — FENTANYL CITRATE (PF) 100 MCG/2ML IJ SOLN
INTRAMUSCULAR | Status: AC
  Filled 2023-02-09: qty 2

## 2023-02-09 MED ORDER — CELECOXIB 100 MG PO CAPS: 100.0000 mg | ORAL_CAPSULE | Freq: Two times a day (BID) | ORAL | 0 refills | Status: AC

## 2023-02-09 MED ORDER — METHOCARBAMOL 500 MG IVPB - SIMPLE MED: 500.0000 mg | Freq: Four times a day (QID) | INTRAVENOUS | Status: DC | PRN

## 2023-02-09 MED ORDER — PHENYLEPHRINE HCL (PRESSORS) 10 MG/ML IV SOLN
INTRAVENOUS | Status: AC
  Filled 2023-02-09: qty 1

## 2023-02-09 MED ORDER — OXYCODONE HCL 5 MG PO TABS
ORAL_TABLET | ORAL | Status: AC
  Filled 2023-02-09: qty 1

## 2023-02-09 MED ORDER — MUPIROCIN 2 % EX OINT
1.0000 | TOPICAL_OINTMENT | Freq: Two times a day (BID) | CUTANEOUS | 0 refills | Status: AC
Start: 2023-02-09 — End: 2023-03-11

## 2023-02-09 MED ORDER — ASPIRIN 81 MG PO TBEC: 81.0000 mg | DELAYED_RELEASE_TABLET | Freq: Two times a day (BID) | ORAL | 0 refills | Status: AC

## 2023-02-09 MED ORDER — BUPIVACAINE LIPOSOME 1.3 % IJ SUSP
INTRAMUSCULAR | Status: AC
  Filled 2023-02-09: qty 20

## 2023-02-09 MED ORDER — PROPOFOL 10 MG/ML IV BOLUS
INTRAVENOUS | Status: AC
  Filled 2023-02-09: qty 20

## 2023-02-09 MED ORDER — KETOROLAC TROMETHAMINE 15 MG/ML IJ SOLN
INTRAMUSCULAR | Status: AC
  Filled 2023-02-09: qty 1

## 2023-02-09 MED ORDER — METHOCARBAMOL 500 MG PO TABS: 500.0000 mg | ORAL_TABLET | Freq: Three times a day (TID) | ORAL | 0 refills | Status: AC | PRN

## 2023-02-09 MED ORDER — BUPIVACAINE LIPOSOME 1.3 % IJ SUSP
INTRAMUSCULAR | Status: DC | PRN
  Administered 2023-02-09: 20 mL

## 2023-02-09 MED ORDER — ACETAMINOPHEN 325 MG PO TABS: 325.0000 mg | ORAL_TABLET | Freq: Four times a day (QID) | ORAL | Status: DC | PRN

## 2023-02-09 MED ORDER — SUCCINYLCHOLINE CHLORIDE 200 MG/10ML IV SOSY
PREFILLED_SYRINGE | INTRAVENOUS | Status: AC
  Filled 2023-02-09: qty 10

## 2023-02-09 MED ORDER — LIDOCAINE 2% (20 MG/ML) 5 ML SYRINGE
INTRAMUSCULAR | Status: DC | PRN
  Administered 2023-02-09: 40 mg via INTRAVENOUS

## 2023-02-09 MED ORDER — LACTATED RINGERS IV BOLUS: 250.0000 mL | Freq: Once | INTRAVENOUS | Status: DC

## 2023-02-09 MED ORDER — OXYCODONE HCL 5 MG PO TABS: 5.0000 mg | ORAL_TABLET | ORAL | 0 refills | Status: AC | PRN

## 2023-02-09 MED ORDER — EPHEDRINE 5 MG/ML INJ
INTRAVENOUS | Status: AC
  Filled 2023-02-09: qty 5

## 2023-02-09 MED ORDER — PROPOFOL 1000 MG/100ML IV EMUL
INTRAVENOUS | Status: AC
  Filled 2023-02-09: qty 100

## 2023-02-09 MED ORDER — POVIDONE-IODINE 10 % EX SWAB: 2.0000 | Freq: Once | CUTANEOUS | Status: DC

## 2023-02-09 MED ORDER — HYDROMORPHONE HCL 1 MG/ML IJ SOLN: 0.5000 mg | INTRAMUSCULAR | Status: DC | PRN

## 2023-02-09 MED ORDER — SODIUM CHLORIDE 0.9% FLUSH
INTRAVENOUS | Status: DC | PRN
  Administered 2023-02-09: 50 mL
  Administered 2023-02-09: 10 mL

## 2023-02-09 MED ORDER — LIDOCAINE HCL (PF) 2 % IJ SOLN
INTRAMUSCULAR | Status: AC
  Filled 2023-02-09: qty 10

## 2023-02-09 MED ORDER — ACETAMINOPHEN 500 MG PO TABS
1000.0000 mg | ORAL_TABLET | Freq: Once | ORAL | Status: AC
  Administered 2023-02-09: 1000 mg via ORAL
  Filled 2023-02-09: qty 2

## 2023-02-09 MED ORDER — ROPIVACAINE HCL 5 MG/ML IJ SOLN
INTRAMUSCULAR | Status: DC | PRN
  Administered 2023-02-09: 30 mL via PERINEURAL

## 2023-02-09 MED ORDER — ACETAMINOPHEN 500 MG PO TABS: 1000.0000 mg | ORAL_TABLET | Freq: Four times a day (QID) | ORAL | Status: DC

## 2023-02-09 MED ORDER — MIDAZOLAM HCL 2 MG/2ML IJ SOLN
INTRAMUSCULAR | Status: AC
  Filled 2023-02-09: qty 2

## 2023-02-09 MED ORDER — OXYCODONE HCL 5 MG PO TABS
5.0000 mg | ORAL_TABLET | ORAL | Status: DC | PRN
  Administered 2023-02-09: 5 mg via ORAL

## 2023-02-09 MED ORDER — LACTATED RINGERS IV SOLN: INTRAVENOUS | Status: DC | PRN

## 2023-02-09 MED ORDER — HYDROMORPHONE HCL 1 MG/ML IJ SOLN: 0.2500 mg | INTRAMUSCULAR | Status: DC | PRN

## 2023-02-09 MED ORDER — PHENYLEPHRINE 80 MCG/ML (10ML) SYRINGE FOR IV PUSH (FOR BLOOD PRESSURE SUPPORT)
PREFILLED_SYRINGE | INTRAVENOUS | Status: AC
  Filled 2023-02-09: qty 10

## 2023-02-09 MED ORDER — ACETAMINOPHEN 500 MG PO TABS: 1000.0000 mg | ORAL_TABLET | Freq: Three times a day (TID) | ORAL | 0 refills | Status: AC | PRN

## 2023-02-09 MED ORDER — LACTATED RINGERS IV BOLUS
500.0000 mL | Freq: Once | INTRAVENOUS | Status: AC
  Administered 2023-02-09: 500 mL via INTRAVENOUS

## 2023-02-09 MED ORDER — ONDANSETRON HCL 4 MG PO TABS: 4.0000 mg | ORAL_TABLET | Freq: Three times a day (TID) | ORAL | 0 refills | Status: AC | PRN

## 2023-02-09 SURGICAL SUPPLY — 63 items
ADH SKN CLS APL DERMABOND .7 (GAUZE/BANDAGES/DRESSINGS) ×1
APL PRP STRL LF DISP 70% ISPRP (MISCELLANEOUS) ×2
BAG COUNTER SPONGE SURGICOUNT (BAG) IMPLANT
BAG SPNG CNTER NS LX DISP (BAG)
BLADE SAG 18X100X1.27 (BLADE) ×2 IMPLANT
BLADE SAW SAG 35X64 .89 (BLADE) ×2 IMPLANT
BNDG CMPR 5X3 CHSV STRCH STRL (GAUZE/BANDAGES/DRESSINGS) ×1
BNDG CMPR MED 10X6 ELC LF (GAUZE/BANDAGES/DRESSINGS) ×1
BNDG COHESIVE 3X5 TAN ST LF (GAUZE/BANDAGES/DRESSINGS) ×2 IMPLANT
BNDG ELASTIC 6X10 VLCR STRL LF (GAUZE/BANDAGES/DRESSINGS) ×2 IMPLANT
BOWL SMART MIX CTS (DISPOSABLE) ×2 IMPLANT
CEMENT BONE REFOBACIN R1X40 US (Cement) IMPLANT
CHLORAPREP W/TINT 26 (MISCELLANEOUS) ×4 IMPLANT
COMP FEM PS KNEE NRW 7 RT (Joint) ×1 IMPLANT
COMPONENT FEM PS KNEE NRW 7 RT (Joint) IMPLANT
COVER SURGICAL LIGHT HANDLE (MISCELLANEOUS) ×2 IMPLANT
CUFF TOURN SGL QUICK 34 (TOURNIQUET CUFF) ×1
CUFF TRNQT CYL 34X4.125X (TOURNIQUET CUFF) ×2 IMPLANT
DERMABOND ADVANCED .7 DNX12 (GAUZE/BANDAGES/DRESSINGS) ×2 IMPLANT
DRAPE INCISE IOBAN 85X60 (DRAPES) ×2 IMPLANT
DRAPE SHEET LG 3/4 BI-LAMINATE (DRAPES) ×2 IMPLANT
DRAPE U-SHAPE 47X51 STRL (DRAPES) ×2 IMPLANT
DRESSING AQUACEL AG SP 3.5X10 (GAUZE/BANDAGES/DRESSINGS) ×2 IMPLANT
DRSG AQUACEL AG ADV 3.5X10 (GAUZE/BANDAGES/DRESSINGS) IMPLANT
DRSG AQUACEL AG SP 3.5X10 (GAUZE/BANDAGES/DRESSINGS) ×1
ELECT REM PT RETURN 15FT ADLT (MISCELLANEOUS) ×2 IMPLANT
GAUZE SPONGE 4X4 12PLY STRL (GAUZE/BANDAGES/DRESSINGS) ×2 IMPLANT
GLOVE BIO SURGEON STRL SZ 6.5 (GLOVE) ×4 IMPLANT
GLOVE BIOGEL PI IND STRL 6.5 (GLOVE) ×2 IMPLANT
GLOVE BIOGEL PI IND STRL 8 (GLOVE) ×2 IMPLANT
GLOVE SURG ORTHO 8.0 STRL STRW (GLOVE) ×4 IMPLANT
GOWN STRL REUS W/ TWL XL LVL3 (GOWN DISPOSABLE) ×4 IMPLANT
GOWN STRL REUS W/TWL XL LVL3 (GOWN DISPOSABLE) ×2
HANDPIECE INTERPULSE COAX TIP (DISPOSABLE) ×1
HDLS TROCR DRIL PIN KNEE 75 (PIN) ×1
HOLDER FOLEY CATH W/STRAP (MISCELLANEOUS) ×2 IMPLANT
HOOD PEEL AWAY T7 (MISCELLANEOUS) ×6 IMPLANT
IMPL PATELLA METAL SZ32X10 (Joint) IMPLANT
INSERT TIB ASF PERS CD6-7 RT (Insert) IMPLANT
KIT TURNOVER KIT A (KITS) IMPLANT
MANIFOLD NEPTUNE II (INSTRUMENTS) ×2 IMPLANT
MARKER SKIN DUAL TIP RULER LAB (MISCELLANEOUS) ×2 IMPLANT
NS IRRIG 1000ML POUR BTL (IV SOLUTION) ×2 IMPLANT
PACK TOTAL KNEE CUSTOM (KITS) ×2 IMPLANT
PIN DRILL HDLS TROCAR 75 4PK (PIN) IMPLANT
SCREW HEADED 33MM KNEE (MISCELLANEOUS) IMPLANT
SET HNDPC FAN SPRY TIP SCT (DISPOSABLE) ×2 IMPLANT
SOLUTION IRRIG SURGIPHOR (IV SOLUTION) IMPLANT
SPIKE FLUID TRANSFER (MISCELLANEOUS) ×2 IMPLANT
STEM TIB PS KNEE D 0D RT (Stem) IMPLANT
STRIP CLOSURE SKIN 1/2X4 (GAUZE/BANDAGES/DRESSINGS) ×2 IMPLANT
SUT MNCRL AB 3-0 PS2 18 (SUTURE) ×2 IMPLANT
SUT STRATAFIX 0 PDS 27 VIOLET (SUTURE) ×1
SUT STRATAFIX PDO 1 14 VIOLET (SUTURE) ×1
SUT STRATFX PDO 1 14 VIOLET (SUTURE) ×1
SUT VIC AB 2-0 CT2 27 (SUTURE) ×4 IMPLANT
SUTURE STRATFX 0 PDS 27 VIOLET (SUTURE) ×2 IMPLANT
SUTURE STRATFX PDO 1 14 VIOLET (SUTURE) ×2 IMPLANT
SYR 50ML LL SCALE MARK (SYRINGE) ×2 IMPLANT
TRAY FOLEY MTR SLVR 14FR STAT (SET/KITS/TRAYS/PACK) IMPLANT
TUBE SUCTION HIGH CAP CLEAR NV (SUCTIONS) ×2 IMPLANT
UNDERPAD 30X36 HEAVY ABSORB (UNDERPADS AND DIAPERS) ×2 IMPLANT
WRAP KNEE MAXI GEL POST OP (GAUZE/BANDAGES/DRESSINGS) IMPLANT

## 2023-02-09 NOTE — Anesthesia Postprocedure Evaluation (Signed)
Anesthesia Post Note  Patient: Lynn Bond  Procedure(s) Performed: TOTAL KNEE ARTHROPLASTY (Right: Knee)     Patient location during evaluation: PACU Anesthesia Type: Spinal Level of consciousness: oriented and awake and alert Pain management: pain level controlled Vital Signs Assessment: post-procedure vital signs reviewed and stable Respiratory status: spontaneous breathing, respiratory function stable and patient connected to nasal cannula oxygen Cardiovascular status: blood pressure returned to baseline and stable Postop Assessment: no headache, no backache and no apparent nausea or vomiting Anesthetic complications: no  No notable events documented.  Last Vitals:  Vitals:   02/09/23 1400 02/09/23 1505  BP: 93/62 122/76  Pulse: 75 (!) 58  Resp:    Temp:    SpO2: 97% 98%    Last Pain:  Vitals:   02/09/23 1505  TempSrc:   PainSc: 3                  Shelton Silvas

## 2023-02-09 NOTE — Interval H&P Note (Signed)

## 2023-02-09 NOTE — Anesthesia Procedure Notes (Signed)
Date/Time: 02/09/2023 7:15 AM  Performed by: Minerva Ends, CRNAPre-anesthesia Checklist: Patient identified, Emergency Drugs available, Suction available, Patient being monitored and Timeout performed Patient Re-evaluated:Patient Re-evaluated prior to induction Oxygen Delivery Method: Simple face mask Placement Confirmation: positive ETCO2 and breath sounds checked- equal and bilateral

## 2023-02-09 NOTE — Progress Notes (Signed)
Orthopedic Tech Progress Note Patient Details:  Lynn Bond 11-10-1965 [id number]  Ortho Devices Type of Ortho Device: Bone foam zero knee Ortho Device/Splint Interventions: Ordered  Delivered Bone foam to patient. Did not apply due to patient being in stretcher and not a bed. Left for RN to apply.     Sherilyn Banker 02/09/2023, 11:37 AM

## 2023-02-09 NOTE — Evaluation (Signed)
Physical Therapy Evaluation Patient Details Name: Lynn Bond MRN: [id number] DOB: Jul 27, 1966 Today's Date: 02/09/2023  History of Present Illness  57 yo female presents to therapy s/p R TKA on 02/09/2023 due to failure of conservative measures. Pt PMH includes but is not limited to: asthma, HTN, R CTR and heel spur sx.  Clinical Impression    Lynn Bond is a 57 y.o. female POD 0 s/p R TKA. Patient reports mod I with use of SPC with mobility at baseline. Patient is now limited by functional impairments (see PT problem list below) and requires min guard and cues for transfers and gait with RW. Patient was able to ambulate 40 feet x 2 with RW and min guard  and cues for safe walker management. Patient educated on safe sequencing for car transfers, safety and fall risk prevention with mobility tasks, pain management and use of CP/ICE pt and spouse verbalized understanding of safe guarding position for people assisting with mobility. Patient instructed in exercises to facilitate ROM and circulation reviewed with HO provided . Patient will benefit from continued skilled PT interventions to address impairments and progress towards PLOF. Patient has met mobility goals at adequate level for discharge home with family support and OPPT; will continue to follow if pt continues acute stay to progress towards Mod I goals.      Recommendations for follow up therapy are one component of a multi-disciplinary discharge planning process, led by the attending physician.  Recommendations may be updated based on patient status, additional functional criteria and insurance authorization.  Follow Up Recommendations       Assistance Recommended at Discharge Intermittent Supervision/Assistance  Patient can return home with the following  A little help with walking and/or transfers;A little help with bathing/dressing/bathroom;Assistance with cooking/housework;Assist for transportation;Help with stairs or  ramp for entrance    Equipment Recommendations Rolling walker (2 wheels) (adjusted and provided at eval)  Recommendations for Other Services       Functional Status Assessment Patient has had a recent decline in their functional status and demonstrates the ability to make significant improvements in function in a reasonable and predictable amount of time.     Precautions / Restrictions Precautions Precautions: Knee;Fall Restrictions Weight Bearing Restrictions: No      Mobility  Bed Mobility Overal bed mobility: Needs Assistance Bed Mobility: Supine to Sit     Supine to sit: Supervision     General bed mobility comments: min cues    Transfers Overall transfer level: Needs assistance Equipment used: Rolling walker (2 wheels) Transfers: Sit to/from Stand Sit to Stand: Min guard           General transfer comment: cues for proper UE and AD placement with bed, recliner and commode transfers    Ambulation/Gait Ambulation/Gait assistance: Min guard Gait Distance (Feet): 40 Feet Assistive device: Rolling walker (2 wheels) Gait Pattern/deviations: Step-to pattern, Antalgic Gait velocity: decreased     General Gait Details: B UE support at RW to offload R LE in stance phase  Stairs            Wheelchair Mobility    Modified Rankin (Stroke Patients Only)       Balance Overall balance assessment: Needs assistance Sitting-balance support: Feet unsupported, Single extremity supported Sitting balance-Leahy Scale: Good     Standing balance support: Bilateral upper extremity supported, During functional activity, Reliant on assistive device for balance Standing balance-Leahy Scale: Fair Standing balance comment: static standing No UE support  Pertinent Vitals/Pain Pain Assessment Pain Assessment: 0-10 Pain Score: 3  Pain Location: R knee Pain Descriptors / Indicators: Aching, Dull, Operative site guarding Pain  Intervention(s): Limited activity within patient's tolerance, Monitored during session, Premedicated before session, Repositioned, Ice applied    Home Living Family/patient expects to be discharged to:: Private residence Living Arrangements: Spouse/significant other Available Help at Discharge: Friend(s) Type of Home: House         Home Layout: One level Home Equipment: Cane - single point      Prior Function Prior Level of Function : Independent/Modified Independent             Mobility Comments: occational use of SPC for mobility and mod I with all ADLs, self care tasks, IADLs       Hand Dominance        Extremity/Trunk Assessment        Lower Extremity Assessment Lower Extremity Assessment: RLE deficits/detail RLE Deficits / Details: ankle DF/PF 5/5; SLR < 10 degree lag RLE Sensation: WNL    Cervical / Trunk Assessment Cervical / Trunk Assessment:  (wfl)  Communication   Communication: No difficulties  Cognition Arousal/Alertness: Awake/alert Behavior During Therapy: WFL for tasks assessed/performed Overall Cognitive Status: Within Functional Limits for tasks assessed                                          General Comments      Exercises Total Joint Exercises Ankle Circles/Pumps: AROM, Both, 20 reps Quad Sets: AROM, Right, 5 reps Heel Slides: AROM, Right, 5 reps Hip ABduction/ADduction: AROM, Right, 5 reps Straight Leg Raises: AROM, Right, 5 reps Long Arc Quad: AROM, Right, 5 reps   Assessment/Plan    PT Assessment Patient needs continued PT services  PT Problem List Decreased strength;Decreased range of motion;Decreased activity tolerance;Decreased balance;Decreased mobility;Decreased coordination;Pain       PT Treatment Interventions DME instruction;Gait training;Functional mobility training;Therapeutic activities;Therapeutic exercise;Balance training;Neuromuscular re-education;Patient/family education;Modalities    PT  Goals (Current goals can be found in the Care Plan section)  Acute Rehab PT Goals Patient Stated Goal: to be able to walk normally and no pain to go to the [employer] PT Goal Formulation: With patient Time For Goal Achievement: 02/23/23 Potential to Achieve Goals: Good    Frequency       Co-evaluation               AM-PAC PT "6 Clicks" Mobility  Outcome Measure Help needed turning from your back to your side while in a flat bed without using bedrails?: None Help needed moving from lying on your back to sitting on the side of a flat bed without using bedrails?: A Little Help needed moving to and from a bed to a chair (including a wheelchair)?: A Little Help needed standing up from a chair using your arms (e.g., wheelchair or bedside chair)?: A Little Help needed to walk in hospital room?: A Little Help needed climbing 3-5 steps with a railing? : A Lot 6 Click Score: 18    End of Session Equipment Utilized During Treatment: Gait belt Activity Tolerance: Patient tolerated treatment well Patient left: in chair;with call bell/phone within reach;with family/visitor present;with nursing/sitter in room Nurse Communication: Mobility status;Other (comment) (readiness for d/c) PT Visit Diagnosis: Unsteadiness on feet (R26.81);Other abnormalities of gait and mobility (R26.89);Muscle weakness (generalized) (M62.81);Pain Pain - Right/Left: Right Pain - part of body:  Knee    Time: 1610-9604 PT Time Calculation (min) (ACUTE ONLY): 43 min   Charges:   PT Evaluation $PT Eval Low Complexity: 1 Low PT Treatments $Gait Training: 8-22 mins $Therapeutic Exercise: 8-22 mins         Johnny Bridge, PT Acute Rehab   Jacqualyn Posey 02/09/2023, 3:48 PM

## 2023-02-09 NOTE — Anesthesia Procedure Notes (Signed)
Date/Time: 02/09/2023 7:00 AM  Performed by: Minerva Ends, CRNAPre-anesthesia Checklist: Patient identified, Emergency Drugs available, Suction available, Patient being monitored and Timeout performed Patient Re-evaluated:Patient Re-evaluated prior to induction Oxygen Delivery Method: Nasal cannula Placement Confirmation: breath sounds checked- equal and bilateral and positive ETCO2 Dental Injury: Teeth and Oropharynx as per pre-operative assessment

## 2023-02-09 NOTE — Discharge Instructions (Addendum)

## 2023-02-09 NOTE — Anesthesia Procedure Notes (Signed)
Anesthesia Regional Block: Adductor canal block   Pre-Anesthetic Checklist: , timeout performed,  Correct Patient, Correct Site, Correct Laterality,  Correct Procedure, Correct Position, site marked,  Risks and benefits discussed,  Surgical consent,  Pre-op evaluation,  At surgeon's request and post-op pain management  Laterality: Right  Prep: chloraprep       Needles:  Injection technique: Single-shot  Needle Type: Echogenic Stimulator Needle     Needle Length: 9cm  Needle Gauge: 21     Additional Needles:   Procedures:,,,, ultrasound used (permanent image in chart),,    Narrative:  Start time: 02/09/2023 7:05 AM End time: 02/09/2023 7:10 AM Injection made incrementally with aspirations every 5 mL.  Performed by: Personally  Anesthesiologist: Shelton Silvas, MD  Additional Notes: Discussed risks and benefits of the nerve block in detail, including but not limited vascular injury, permanent nerve damage and infection.   Patient tolerated the procedure well. Local anesthetic introduced in an incremental fashion under minimal resistance after negative aspirations. No paresthesias were elicited. After completion of the procedure, no acute issues were identified and patient continued to be monitored by RN.

## 2023-02-09 NOTE — Transfer of Care (Signed)
Immediate Anesthesia Transfer of Care Note  Patient: Lynn Bond  Procedure(s) Performed: TOTAL KNEE ARTHROPLASTY (Right: Knee)  Patient Location: PACU  Anesthesia Type:Spinal  Level of Consciousness: awake  Airway & Oxygen Therapy: Patient Spontanous Breathing and Patient connected to face mask oxygen  Post-op Assessment: Report given to RN and Post -op Vital signs reviewed and stable  Post vital signs: Reviewed and stable  Last Vitals:  Vitals Value Taken Time  BP 89/65 02/09/23 0945  Temp    Pulse 55 02/09/23 0948  Resp 16 02/09/23 0948  SpO2 100 % 02/09/23 0948  Vitals shown include unvalidated device data.  Last Pain:  Vitals:   02/09/23 0605  TempSrc: Oral  PainSc:          Complications: No notable events documented.

## 2023-02-09 NOTE — Op Note (Signed)
DATE OF SURGERY:  02/09/2023 TIME: 9:15 AM  PATIENT NAME:  Lynn Bond   AGE: 57 y.o.    PRE-OPERATIVE DIAGNOSIS: End-stage right knee osteoarthritis  POST-OPERATIVE DIAGNOSIS:  Same  PROCEDURE: Right total Knee Arthroplasty  SURGEON:  Lakea Mittelman A Annise Boran, MD   ASSISTANT: Kathie Dike, PA-C, present and scrubbed throughout the case, critical for assistance with exposure, retraction, instrumentation, and closure.   OPERATIVE IMPLANTS:  Press-fit Zimmer persona size 7 narrow femur, D press-fit Osseo Ti tibial baseplate, 32 mm NexGen press-fit patella, 11 mm MC poly insert Implant Name Type Inv. Item Serial No. Manufacturer Lot No. LRB No. Used Action  COMP FEM PS KNEE NRW 7 RT - [id number] Joint COMP FEM PS KNEE NRW 7 RT  [employer](ORTH,TRAU,BIO,SG) [id number] Right 1 Implanted  IMPL PATELLA METAL [id number] - [id number] Joint IMPL PATELLA METAL [id number]  [employer](ORTH,TRAU,BIO,SG) [id number] Right 1 Implanted  STEM TIB PS KNEE D 0D RT - [id number] Stem STEM TIB PS KNEE D 0D RT  [employer](ORTH,TRAU,BIO,SG) [id number] Right 1 Implanted  INSERT TIB ASF PERS CD6-7 RT - [id number] Insert INSERT TIB ASF PERS CD6-7 RT  [employer](ORTH,TRAU,BIO,SG) [id number] Right 1 Implanted     PREOPERATIVE INDICATIONS:  [name] is a 57 y.o. year old female with end stage bone on bone degenerative arthritis of the knee who failed conservative treatment, including injections, antiinflammatories, activity modification, and assistive devices, and had significant impairment of their activities of daily living, and elected for Total Knee Arthroplasty.   The risks, benefits, and alternatives were discussed at length including but not limited to the risks of infection, bleeding, nerve injury, stiffness, blood clots, the need for revision surgery, cardiopulmonary complications, among others, and they were willing to proceed.  ESTIMATED BLOOD LOSS: 50cc  OPERATIVE DESCRIPTION:   Once  adequate anesthesia was induced, preoperative antibiotics, 2 gm of ancef and 1g vanc (Preop MRSA+ nasal swab),1 gm of Tranexamic Acid, and 8 mg of Decadron administered, the patient was positioned supine with a right thigh tourniquet placed.  The right lower extremity was prepped and draped in sterile fashion.  A time-  out was performed identifying the patient, planned procedure, and the appropriate extremity.     The leg was  exsanguinated, tourniquet elevated to 250 mmHg.  A midline incision was  made followed by median parapatellar arthrotomy. Anterior horn of the medial meniscus was released and resected. A medial release was performed, the infrapatellar fat pad was resected with care taken to protect the patellar tendon. The suprapatellar fat was removed to exposed the distal anterior femur. The anterior horn of the lateral meniscus and ACL were released.    Following initial  exposure, I first started with the femur  The femoral  canal was opened with a drill, canal was suctioned to try to prevent fat emboli.  An  intramedullary rod was passed set at 7 degrees valgus (given severe lateral bone loss), 10 mm. The distal femur was resected.  Following this resection, the tibia was  subluxated anteriorly.  Using the extramedullary guide, 10mm of bone was resected off   the proximal lateral tibia.  We confirmed the gap would be  stable medially and laterally with a size 10mm spacer block as well as confirmed that the tibial cut was perpendicular in the coronal plane, checking with an alignment rod.    Once this was done, the posterior femoral referencing femoral sizer was placed under to the posterior condyles with 3 degrees of external rotational  which was parallel to the transepicondylar axis and perpendicular to [employer]. The femur was sized to be a size 7 in the anterior-  posterior dimension. The  anterior, posterior, and  chamfer cuts were made without difficulty nor   notching making certain  that I was along the anterior cortex to help  with flexion gap stability. Next a laminar spreader was placed with the knee in flexion and the medial lateral menisci were resected.  5 cc of the Exparel mixture was injected in the medial side of the back of the knee and 3 cc in the lateral side.  1/2 inch curved osteotome was used to resect posterior osteophyte that was then removed with a pituitary rongeur.       At this point, the tibia was sized to be a size D.  The size D tray was  then pinned in position. Trial reduction was now carried with a 7 femur, D tibia, a 10 mm MC insert. Slight medial laxity improved with 11mm insert. The knee had full extension and was stable to varus valgus stress in extension.  The knee was slightly tight in flexion and the PCL was partially released.   Attention was next directed to the patella.  Precut  measurement was noted to be 22 mm.  I resected down to 14 mm and used a  32mm patellar button to restore patellar height as well as cover the cut surface.     The patella lug holes were drilled and a 32mm patella poly trial was placed.    The knee was brought to full extension with good flexion stability with the patella tracking through the trochlea without application of pressure.    Next the femoral component was again assessed and determined to be seated and appropriately lateralized.  The femoral lug holes were drilled.  The femoral component was then removed. Tibial component was again assessed and felt to be seated and appropriately rotated with the medial third of the tubercle. The tibia was then drilled, and keel punched.     Final components were  opened and impacted into place.   The knee was irrigated with sterile Betadine diluted in saline as well as pulse lavage normal saline. The synovial lining was  then injected a dilute Exparel.     I confirmed that I was satisfied with the range of motion and stability, and the final 11 mm MC poly insert was chosen.   It was placed into the knee.         The tourniquet had been let down at 60 minutes.  No significant hemostasis was required.  The medial parapatellar arthrotomy was then reapproximated using #1 Stratafix sutures with the knee  in flexion.  The remaining wound was closed with 0 stratafix, 2-0 Vicryl, and running 3-0 Monocryl. The knee was cleaned, dried, dressed sterilely using Dermabond and   Aquacel dressing.  The patient was then brought to recovery room in stable condition, tolerating the procedure  well. There were no complications.   Post op recs: WB: WBAT Abx: ancef + vanc (preop MRSA) Imaging: PACU xrays DVT prophylaxis: Aspirin 81mg  BID x4 weeks Follow up: 2 weeks after surgery for a wound check with Dr. [name] at [facility].  Address: [address], [place], [place] [address]  Office Phone: [phone]  Weber Cooks, MD Orthopaedic Surgery

## 2023-02-09 NOTE — Anesthesia Preprocedure Evaluation (Addendum)
Anesthesia Evaluation  Patient identified by MRN, date of birth, ID band Patient awake    Reviewed: Allergy & Precautions, NPO status , Patient's Chart, lab work & pertinent test results  Airway Mallampati: I  TM Distance: >3 FB Neck ROM: Full    Dental  (+) Edentulous Upper, Edentulous Lower   Pulmonary asthma , former smoker   breath sounds clear to auscultation       Cardiovascular hypertension, Pt. on medications  Rhythm:Regular Rate:Normal     Neuro/Psych negative neurological ROS  negative psych ROS   GI/Hepatic Neg liver ROS,GERD  ,,  Endo/Other  negative endocrine ROS    Renal/GU negative Renal ROS     Musculoskeletal  (+) Arthritis ,    Abdominal   Peds  Hematology negative hematology ROS (+)   Anesthesia Other Findings   Reproductive/Obstetrics                             Anesthesia Physical Anesthesia Plan  ASA: 2  Anesthesia Plan: Spinal   Post-op Pain Management: Regional block*   Induction: Intravenous  PONV Risk Score and Plan: 3 and Ondansetron, Dexamethasone and Propofol infusion  Airway Management Planned: Simple Face Mask and Natural Airway  Additional Equipment: None  Intra-op Plan:   Post-operative Plan:   Informed Consent: I have reviewed the patients History and Physical, chart, labs and discussed the procedure including the risks, benefits and alternatives for the proposed anesthesia with the patient or authorized representative who has indicated his/her understanding and acceptance.       Plan Discussed with: CRNA  Anesthesia Plan Comments:        Anesthesia Quick Evaluation

## 2023-02-09 NOTE — Anesthesia Procedure Notes (Signed)
Spinal  Start time: 02/09/2023 7:18 AM End time: 02/09/2023 7:21 AM Reason for block: surgical anesthesia Staffing Performed: anesthesiologist  Anesthesiologist: Shelton Silvas, MD Performed by: Shelton Silvas, MD Authorized by: Shelton Silvas, MD   Preanesthetic Checklist Completed: patient identified, IV checked, site marked, risks and benefits discussed, surgical consent, monitors and equipment checked, pre-op evaluation and timeout performed Spinal Block Patient position: sitting Prep: DuraPrep and site prepped and draped Location: L2-3 Injection technique: single-shot Needle Needle type: Pencan  Needle gauge: 24 G Needle length: 10 cm Needle insertion depth: 10 cm Additional Notes Patient tolerated well. No immediate complications.  Attempt #1 at Level L4-5, patient appears to have small, lipomatous mass at the level Aborted procedure.   Attempt #2 at L2-3 - successful  Functioning IV was confirmed and monitors were applied. Sterile prep and drape, including hand hygiene and sterile gloves were used. The patient was positioned and the back was prepped. The skin was anesthetized with lidocaine. Free flow of clear CSF was obtained prior to injecting local anesthetic into the CSF. The spinal needle aspirated freely following injection. The needle was carefully withdrawn. The patient tolerated the procedure well.

## 2023-02-10 ENCOUNTER — Encounter (HOSPITAL_COMMUNITY): Payer: Self-pay | Admitting: Orthopedic Surgery

## 2023-02-13 ENCOUNTER — Ambulatory Visit (HOSPITAL_COMMUNITY)
Admission: RE | Admit: 2023-02-13 | Discharge: 2023-02-13 | Disposition: A | Discharge status: Home / Self Care | Payer: Commercial Managed Care - HMO | Source: Ambulatory Visit | Attending: Cardiology | Admitting: Cardiology

## 2023-02-13 ENCOUNTER — Other Ambulatory Visit (HOSPITAL_COMMUNITY): Payer: Self-pay | Admitting: Physician Assistant

## 2023-02-13 DIAGNOSIS — R609 Edema, unspecified: Secondary | ICD-10-CM

## 2023-02-23 ENCOUNTER — Encounter: Payer: Self-pay | Admitting: *Deleted

## 2023-02-24 DIAGNOSIS — M1711 Unilateral primary osteoarthritis, right knee: Secondary | ICD-10-CM | POA: Diagnosis not present

## 2023-03-12 ENCOUNTER — Ambulatory Visit (INDEPENDENT_AMBULATORY_CARE_PROVIDER_SITE_OTHER): Payer: Commercial Managed Care - HMO | Admitting: Physician Assistant

## 2023-03-12 ENCOUNTER — Encounter (INDEPENDENT_AMBULATORY_CARE_PROVIDER_SITE_OTHER): Payer: Self-pay | Admitting: Physician Assistant

## 2023-03-12 VITALS — BP 134/82 | HR 61 | Temp 97.7°F | Ht 66.0 in | Wt 226.0 lb

## 2023-03-12 DIAGNOSIS — I10 Essential (primary) hypertension: Secondary | ICD-10-CM

## 2023-03-12 DIAGNOSIS — Z6836 Body mass index (BMI) 36.0-36.9, adult: Secondary | ICD-10-CM

## 2023-03-12 DIAGNOSIS — R7303 Prediabetes: Secondary | ICD-10-CM

## 2023-03-12 MED ORDER — TOPIRAMATE 50 MG PO TABS
50.0000 mg | ORAL_TABLET | Freq: Two times a day (BID) | ORAL | 2 refills | Status: DC
Start: 2023-03-12 — End: 2023-11-25

## 2023-03-12 NOTE — Progress Notes (Signed)
.smr  Office: [phone]  /  Fax: [phone]  WEIGHT SUMMARY AND BIOMETRICS  Vitals Temp: 97.7 F (36.5 C) BP: 134/82 Pulse Rate: 61 SpO2: 100 %   Anthropometric Measurements Height: 5\' 6"  (1.676 m) Weight: 226 lb (102.5 kg) BMI (Calculated): 36.49 Weight at Last Visit: 223 lb Weight Lost Since Last Visit: 0 lb Weight Gained Since Last Visit: 3 lb Starting Weight: 262 lb   Body Composition  Body Fat %: 50.4 % Fat Mass (lbs): 114.4 lbs Muscle Mass (lbs): 106.8 lbs Total Body Water (lbs): 88.6 lbs Visceral Fat Rating : 14   Other Clinical Data Fasting: yes Labs: no Today's Visit #: 12 Starting Date: 06/10/22     HPI  Chief Complaint: OBESITY  Nicole is here to discuss her progress with her obesity treatment plan. She is on the the Category 3 Plan and states she is following her eating plan approximately 65 % of the time. She states she is exercising 45 with Physical therapy following total knee replacement surgery 45 minutes 2 times per week.   Interval History:  Since last office visit she is up 3 lbs.  She had a Right total knee replacement on 02/09/23. Reports did well with surgery overall. Had Venous US 02/13/23 post op and DVT was ruled out. Swelling after surgery is slowly improving.  Hunger/appetite-Feels she is getting adequate protein using Premier protein shakes, blending up meats as has not been able to obtain dentures yet following full extractions prior to having total knee surgery done.  Cravings- Well controlled. Stress- increased with recent TKR surgery, but manageable overall Sleep- Felt she was sleeping too much following surgery due to pain medications and has been transitioning off of narcotic pain medication.  Exercise-PT 2 times weekly. Discussed trying to do some chair exercise with upper extremities as well and she is hopeful she can resume water aerobics soon depending on how TKR incision is healing.    Pharmacotherapy: Topamax for  craving associated with prediabetes. No side effects with topamax.   TREATMENT PLAN FOR OBESITY: Current BMI 36.6 Down 36 lbs TBW loss 13.74% Recommended Dietary Goals  Janeliz is currently in the action stage of change. As such, her goal is to continue weight management plan. She has agreed to the Category 3 Plan.  Behavioral Intervention  We discussed the following Behavioral Modification Strategies today: increasing lean protein intake, decreasing simple carbohydrates , increasing vegetables, increasing lower glycemic fruits, avoiding skipping meals, increasing water intake, continue to practice mindfulness when eating, and planning for success.  Additional resources provided today: NA  Recommended Physical Activity Goals  Sanne has been advised to work up to 150 minutes of moderate intensity aerobic activity a week and strengthening exercises 2-3 times per week for cardiovascular health, weight loss maintenance and preservation of muscle mass.   She has agreed to Continue current level of physical activity , Think about ways to increase daily physical activity and overcoming barriers to exercise, and work on chair exercises until she is more mobile following surgery   Pharmacotherapy We discussed various medication options to help Tyhesha with her weight loss efforts and we both agreed to continue topamax 50 mg twice daily for craving/emotional eating associated with prediabetes.    Return in about 4 weeks (around 04/09/2023).Marland Kitchen She was informed of the importance of frequent follow up visits to maximize her success with intensive lifestyle modifications for her multiple health conditions.  PHYSICAL EXAM:  Blood pressure 134/82, pulse 61, temperature 97.7 F (36.5 C),  height 5\' 6"  (1.676 m), weight 226 lb (102.5 kg), last menstrual period 11/01/2019, SpO2 100 %. Body mass index is 36.48 kg/m.  General: She is overweight, cooperative, alert, well developed, and in no acute  distress. Walking with cane with antalgic appearing gait.  PSYCH: Has normal mood, affect and thought process.   Cardiovascular: HR 60's regular, BP 134/682 Lungs: Normal breathing effort, no conversational dyspnea. Neuro: no focal deficits.  DIAGNOSTIC DATA REVIEWED:  BMET    Component Value Date/Time   NA 137 01/29/2023 1134   NA 142 06/10/2022 1214   K 3.3 (L) 01/29/2023 1134   CL 102 01/29/2023 1134   CO2 26 01/29/2023 1134   GLUCOSE 92 01/29/2023 1134   BUN 18 01/29/2023 1134   BUN 17 06/10/2022 1214   CREATININE 0.83 01/29/2023 1134   CREATININE 0.81 04/25/2014 1635   CALCIUM 9.0 01/29/2023 1134   GFRNONAA >60 01/29/2023 1134   GFRNONAA 76 10/11/2012 0917   GFRAA 95 10/13/2019 1419   GFRAA 88 10/11/2012 0917   Lab Results  Component Value Date   HGBA1C 5.6 01/29/2023   HGBA1C 5.8 01/28/2010   Lab Results  Component Value Date   INSULIN 6.2 06/10/2022   Lab Results  Component Value Date   TSH 1.290 06/10/2022   CBC    Component Value Date/Time   WBC 6.1 01/29/2023 1134   RBC 4.93 01/29/2023 1134   HGB 12.5 01/29/2023 1134   HGB 11.4 06/10/2022 1214   HCT 40.3 01/29/2023 1134   HCT 38.6 06/10/2022 1214   PLT 320 01/29/2023 1134   PLT 314 06/10/2022 1214   MCV 81.7 01/29/2023 1134   MCV 79 06/10/2022 1214   MCH 25.4 (L) 01/29/2023 1134   MCHC 31.0 01/29/2023 1134   RDW 17.5 (H) 01/29/2023 1134   RDW 16.8 (H) 06/10/2022 1214   Iron Studies    Component Value Date/Time   IRON 30 (L) 10/11/2012 0918   TIBC 321 10/11/2012 0918   FERRITIN 22 10/11/2012 0918   IRONPCTSAT 9 (L) 10/11/2012 0918   Lipid Panel     Component Value Date/Time   CHOL 162 06/10/2022 1214   TRIG 49 06/10/2022 1214   HDL 60 06/10/2022 1214   CHOLHDL 2.7 06/10/2022 1214   CHOLHDL 2.9 10/11/2012 0917   VLDL 7 10/11/2012 0917   LDLCALC 92 06/10/2022 1214   Hepatic Function Panel     Component Value Date/Time   PROT 8.1 01/29/2023 1134   PROT 7.2 06/10/2022 1214    ALBUMIN 3.8 01/29/2023 1134   ALBUMIN 4.0 06/10/2022 1214   AST 13 (L) 01/29/2023 1134   ALT 12 01/29/2023 1134   ALKPHOS 50 01/29/2023 1134   BILITOT 0.9 01/29/2023 1134   BILITOT 0.4 06/10/2022 1214      Component Value Date/Time   TSH 1.290 06/10/2022 1214   Nutritional Lab Results  Component Value Date   VD25OH 52.8 10/06/2022   VD25OH 19.8 (L) 06/10/2022    ASSOCIATED CONDITIONS ADDRESSED TODAY  ASSESSMENT AND PLAN  Problem List Items Addressed This Visit     Pre-diabetes - Primary   Relevant Medications   topiramate (TOPAMAX) 50 MG tablet   Morbid obesity (HCC)-starting bmi 42.29   Essential hypertension   BMI 36.0-36.9,adult   Prediabetes with cravings:  Last A1c was 5.6  Medication(s): Topiramate 50 mg twice daily.   No side effects with topamax.  Polyphagia:No She is working on nutrition plan to decrease simple carbohydrates, increase lean proteins and exercise to  promote weight loss, improve glycemic control and prevent progression to Type 2 diabetes.   Lab Results  Component Value Date   HGBA1C 5.6 01/29/2023   HGBA1C 5.8 (H) 10/06/2022   HGBA1C 5.8 (H) 06/10/2022   HGBA1C 5.4 07/24/2021   HGBA1C 6.0 (A) 10/13/2019   Lab Results  Component Value Date   INSULIN 6.2 06/10/2022    Plan: Continue and refill Topiramate 50 mg twice daily.  Continue working on nutrition plan to decrease simple carbohydrates, increase lean proteins and exercise to promote weight loss, improve glycemic control and prevent progression to Type 2 diabetes.   Hypertension Hypertension asymptomatic, reasonably well controlled, and no significant medication side effects noted.  Medication(s): amlodipine 10 mg daily   HCTZ 25 mg daily Renal function is normal.   BP Readings from Last 3 Encounters:  03/12/23 134/82  02/09/23 122/76  02/05/23 116/81   Lab Results  Component Value Date   CREATININE 0.83 01/29/2023   CREATININE 0.81 06/10/2022   CREATININE 0.90 07/24/2021    No results found for: "GFR"  Plan: Continue all antihypertensives at current dosages.  Continue to work on nutrition plan to promote weight loss and improve BP control.    ATTESTASTION STATEMENTS:  Reviewed by clinician on day of visit: allergies, medications, problem list, medical history, surgical history, family history, social history, and previous encounter notes.   I have personally spent 32 minutes total time today in preparation, patient care, nutritional counseling and documentation for this visit, including the following: review of clinical lab tests; review of medical tests/procedures/services.      Nijee Heatwole, PA-C

## 2023-03-26 ENCOUNTER — Telehealth (INDEPENDENT_AMBULATORY_CARE_PROVIDER_SITE_OTHER): Payer: Self-pay | Admitting: Physician Assistant

## 2023-03-26 NOTE — Telephone Encounter (Signed)
Patient stated she needs a refill of Topamax

## 2023-04-09 ENCOUNTER — Ambulatory Visit (INDEPENDENT_AMBULATORY_CARE_PROVIDER_SITE_OTHER): Payer: Commercial Managed Care - HMO | Admitting: Physician Assistant
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
## 2023-04-14 ENCOUNTER — Ambulatory Visit: Payer: Commercial Managed Care - HMO | Attending: Orthopedic Surgery | Admitting: Physical Therapy

## 2023-04-14 ENCOUNTER — Encounter: Payer: Self-pay | Admitting: Physical Therapy

## 2023-04-14 ENCOUNTER — Other Ambulatory Visit: Payer: Self-pay

## 2023-04-14 DIAGNOSIS — R2681 Unsteadiness on feet: Secondary | ICD-10-CM | POA: Diagnosis present

## 2023-04-14 DIAGNOSIS — M25561 Pain in right knee: Secondary | ICD-10-CM | POA: Insufficient documentation

## 2023-04-14 DIAGNOSIS — G8929 Other chronic pain: Secondary | ICD-10-CM | POA: Diagnosis present

## 2023-04-14 DIAGNOSIS — M6281 Muscle weakness (generalized): Secondary | ICD-10-CM | POA: Insufficient documentation

## 2023-04-14 DIAGNOSIS — R2689 Other abnormalities of gait and mobility: Secondary | ICD-10-CM | POA: Diagnosis present

## 2023-04-14 NOTE — Patient Instructions (Signed)
Access Code: [email or web address] URL: [email or web address] Date: 04/14/2023 Prepared by: Garen Lah  Exercises - Sit to stand with sink support Movement snack  - 1 x daily - 7 x weekly - 3 sets - 10 reps - Supine Knee Extension Stretch on Towel Roll  - 1 x daily - 7 x weekly - 3 sets - 10 reps - 5 sec  hold - Supine Heel Slide with Strap  - 1 x daily - 7 x weekly - 3 sets - 10 reps - Long Arc Quad  - 1 x daily - 7 x weekly - 3 sets - 10 reps - Standing Terminal Knee Extension with Resistance  - 1 x daily - 7 x weekly - 3 sets - 10 reps - Standing Hamstring Stretch with Step  - 1 x daily - 7 x weekly - 3 sets - 10 reps  Garen Lah, PT, ATRIC Certified Exercise Expert for the Aging Adult  04/14/23 8:34 AM Phone: [phone] Fax: [phone]

## 2023-04-16 ENCOUNTER — Encounter: Payer: Self-pay | Admitting: Physical Therapy
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
## 2023-04-20 ENCOUNTER — Ambulatory Visit: Payer: Commercial Managed Care - HMO | Admitting: Physical Therapy

## 2023-04-20 ENCOUNTER — Encounter: Payer: Self-pay | Admitting: Physical Therapy

## 2023-04-20 DIAGNOSIS — M25561 Pain in right knee: Secondary | ICD-10-CM | POA: Diagnosis not present

## 2023-04-20 DIAGNOSIS — G8929 Other chronic pain: Secondary | ICD-10-CM

## 2023-04-20 DIAGNOSIS — M6281 Muscle weakness (generalized): Secondary | ICD-10-CM

## 2023-04-20 DIAGNOSIS — R2681 Unsteadiness on feet: Secondary | ICD-10-CM

## 2023-04-20 DIAGNOSIS — R2689 Other abnormalities of gait and mobility: Secondary | ICD-10-CM
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
## 2023-04-21 ENCOUNTER — Encounter: Payer: Self-pay | Admitting: Physical Therapy

## 2023-04-21 ENCOUNTER — Ambulatory Visit: Payer: Commercial Managed Care - HMO | Admitting: Physical Therapy

## 2023-04-21 DIAGNOSIS — G8929 Other chronic pain: Secondary | ICD-10-CM

## 2023-04-21 DIAGNOSIS — M6281 Muscle weakness (generalized): Secondary | ICD-10-CM

## 2023-04-21 DIAGNOSIS — R2689 Other abnormalities of gait and mobility: Secondary | ICD-10-CM

## 2023-04-21 DIAGNOSIS — M25561 Pain in right knee: Secondary | ICD-10-CM | POA: Diagnosis not present

## 2023-04-21 DIAGNOSIS — R2681 Unsteadiness on feet: Secondary | ICD-10-CM
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
## 2023-04-28 ENCOUNTER — Encounter: Payer: Self-pay | Admitting: Physical Therapy

## 2023-04-28 ENCOUNTER — Ambulatory Visit: Payer: Commercial Managed Care - HMO | Admitting: Physical Therapy

## 2023-04-28 DIAGNOSIS — R2689 Other abnormalities of gait and mobility: Secondary | ICD-10-CM

## 2023-04-28 DIAGNOSIS — R2681 Unsteadiness on feet: Secondary | ICD-10-CM

## 2023-04-28 DIAGNOSIS — M6281 Muscle weakness (generalized): Secondary | ICD-10-CM

## 2023-04-28 DIAGNOSIS — G8929 Other chronic pain: Secondary | ICD-10-CM

## 2023-04-28 DIAGNOSIS — M25561 Pain in right knee: Secondary | ICD-10-CM | POA: Diagnosis not present
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
## 2023-04-30 ENCOUNTER — Encounter: Payer: Self-pay | Admitting: Physical Therapy

## 2023-04-30 ENCOUNTER — Ambulatory Visit: Payer: Commercial Managed Care - HMO | Attending: Orthopedic Surgery | Admitting: Physical Therapy

## 2023-04-30 DIAGNOSIS — M25561 Pain in right knee: Secondary | ICD-10-CM | POA: Insufficient documentation

## 2023-04-30 DIAGNOSIS — M6281 Muscle weakness (generalized): Secondary | ICD-10-CM | POA: Diagnosis present

## 2023-04-30 DIAGNOSIS — G8929 Other chronic pain: Secondary | ICD-10-CM | POA: Diagnosis present

## 2023-04-30 DIAGNOSIS — R2681 Unsteadiness on feet: Secondary | ICD-10-CM | POA: Insufficient documentation

## 2023-04-30 DIAGNOSIS — R2689 Other abnormalities of gait and mobility: Secondary | ICD-10-CM | POA: Insufficient documentation

## 2023-05-05 ENCOUNTER — Encounter: Payer: Self-pay | Admitting: Physical Therapy

## 2023-05-05 ENCOUNTER — Ambulatory Visit: Payer: Commercial Managed Care - HMO | Admitting: Physical Therapy

## 2023-05-05 DIAGNOSIS — G8929 Other chronic pain: Secondary | ICD-10-CM

## 2023-05-05 DIAGNOSIS — M6281 Muscle weakness (generalized): Secondary | ICD-10-CM

## 2023-05-05 DIAGNOSIS — R2689 Other abnormalities of gait and mobility: Secondary | ICD-10-CM

## 2023-05-05 DIAGNOSIS — M25561 Pain in right knee: Secondary | ICD-10-CM | POA: Diagnosis not present

## 2023-05-05 DIAGNOSIS — H5203 Hypermetropia, bilateral: Secondary | ICD-10-CM | POA: Diagnosis not present
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
## 2023-05-07 ENCOUNTER — Ambulatory Visit: Payer: Commercial Managed Care - HMO | Admitting: Physical Therapy
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
## 2023-05-12 ENCOUNTER — Encounter: Payer: Self-pay | Admitting: Physical Therapy

## 2023-05-12 ENCOUNTER — Ambulatory Visit: Payer: Commercial Managed Care - HMO | Admitting: Physical Therapy

## 2023-05-12 DIAGNOSIS — R2681 Unsteadiness on feet: Secondary | ICD-10-CM

## 2023-05-12 DIAGNOSIS — M6281 Muscle weakness (generalized): Secondary | ICD-10-CM

## 2023-05-12 DIAGNOSIS — G8929 Other chronic pain: Secondary | ICD-10-CM

## 2023-05-12 DIAGNOSIS — R2689 Other abnormalities of gait and mobility: Secondary | ICD-10-CM

## 2023-05-12 DIAGNOSIS — M25561 Pain in right knee: Secondary | ICD-10-CM | POA: Diagnosis not present
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
## 2023-05-14 ENCOUNTER — Encounter: Payer: Self-pay | Admitting: Physical Therapy

## 2023-05-14 ENCOUNTER — Ambulatory Visit: Payer: Commercial Managed Care - HMO | Admitting: Physical Therapy

## 2023-05-14 DIAGNOSIS — M25561 Pain in right knee: Secondary | ICD-10-CM | POA: Diagnosis not present

## 2023-05-14 DIAGNOSIS — R2681 Unsteadiness on feet: Secondary | ICD-10-CM

## 2023-05-14 DIAGNOSIS — G8929 Other chronic pain: Secondary | ICD-10-CM

## 2023-05-14 DIAGNOSIS — M6281 Muscle weakness (generalized): Secondary | ICD-10-CM

## 2023-05-14 DIAGNOSIS — R2689 Other abnormalities of gait and mobility: Secondary | ICD-10-CM

## 2023-05-21 ENCOUNTER — Ambulatory Visit: Payer: Commercial Managed Care - HMO | Admitting: Physical Therapy

## 2023-05-27 ENCOUNTER — Other Ambulatory Visit: Payer: Self-pay

## 2023-05-27 ENCOUNTER — Encounter (HOSPITAL_BASED_OUTPATIENT_CLINIC_OR_DEPARTMENT_OTHER): Payer: Self-pay | Admitting: Emergency Medicine

## 2023-05-27 ENCOUNTER — Emergency Department (HOSPITAL_BASED_OUTPATIENT_CLINIC_OR_DEPARTMENT_OTHER)
Admission: EM | Admit: 2023-05-27 | Discharge: 2023-05-28 | Discharge status: Against Medical Advice | Payer: Commercial Managed Care - HMO | Attending: Emergency Medicine | Admitting: Emergency Medicine

## 2023-05-27 DIAGNOSIS — Z5321 Procedure and treatment not carried out due to patient leaving prior to being seen by health care provider: Secondary | ICD-10-CM | POA: Insufficient documentation

## 2023-05-27 DIAGNOSIS — R109 Unspecified abdominal pain: Secondary | ICD-10-CM | POA: Diagnosis not present

## 2023-05-27 LAB — COMPREHENSIVE METABOLIC PANEL
ALT: 9 U/L (ref 0–44)
AST: 12 U/L — ABNORMAL LOW (ref 15–41)
Albumin: 4.3 g/dL (ref 3.5–5.0)
Alkaline Phosphatase: 68 U/L (ref 38–126)
Anion gap: 10 (ref 5–15)
BUN: 12 mg/dL (ref 6–20)
CO2: 31 mmol/L (ref 22–32)
Calcium: 9.3 mg/dL (ref 8.9–10.3)
Chloride: 98 mmol/L (ref 98–111)
Creatinine, Ser: 0.87 mg/dL (ref 0.44–1.00)
GFR, Estimated: >60 mL/min (ref >60)
Glucose, Bld: 99 mg/dL (ref 70–99)
Potassium: 3.3 mmol/L — ABNORMAL LOW (ref 3.5–5.1)
Sodium: 139 mmol/L (ref 135–145)
Total Bilirubin: 0.7 mg/dL (ref 0.3–1.2)
Total Protein: 8.6 g/dL — ABNORMAL HIGH (ref 6.5–8.1)

## 2023-05-27 LAB — URINALYSIS, ROUTINE W REFLEX MICROSCOPIC
Bilirubin Urine: NEGATIVE
Glucose, UA: NEGATIVE mg/dL
Hgb urine dipstick: NEGATIVE
Ketones, ur: NEGATIVE mg/dL
Leukocytes,Ua: NEGATIVE
Nitrite: NEGATIVE
Protein, ur: NEGATIVE mg/dL
Specific Gravity, Urine: 1.018 (ref 1.005–1.030)
pH: 7.5 (ref 5.0–8.0)

## 2023-05-27 LAB — CBC
HCT: 40.8 % (ref 36.0–46.0)
Hemoglobin: 12.9 g/dL (ref 12.0–15.0)
MCH: 25.5 pg — ABNORMAL LOW (ref 26.0–34.0)
MCHC: 31.6 g/dL (ref 30.0–36.0)
MCV: 80.6 fL (ref 80.0–100.0)
Platelets: 281 10*3/uL (ref 150–400)
RBC: 5.06 MIL/uL (ref 3.87–5.11)
RDW: 17.3 % — ABNORMAL HIGH (ref 11.5–15.5)
WBC: 6.5 10*3/uL (ref 4.0–10.5)
nRBC: 0 % (ref 0.0–0.2)

## 2023-05-27 LAB — LIPASE, BLOOD: Lipase: 15 U/L (ref 11–51)

## 2023-05-27 NOTE — ED Triage Notes (Signed)
Pt via pov from home with abdominal cramping sine Monday and constipation x 1 week. Pt denies emesis. Pt has taken maalox and mylanta with no help. Pt alert & oriented, nad noted.

## 2023-05-28 ENCOUNTER — Ambulatory Visit: Payer: Commercial Managed Care - HMO | Admitting: Physical Therapy

## 2023-05-28 ENCOUNTER — Other Ambulatory Visit: Payer: Self-pay

## 2023-05-28 ENCOUNTER — Encounter (HOSPITAL_BASED_OUTPATIENT_CLINIC_OR_DEPARTMENT_OTHER): Payer: Self-pay | Admitting: Emergency Medicine

## 2023-05-28 ENCOUNTER — Emergency Department (HOSPITAL_BASED_OUTPATIENT_CLINIC_OR_DEPARTMENT_OTHER): Payer: Commercial Managed Care - HMO | Admitting: Radiology

## 2023-05-28 ENCOUNTER — Emergency Department (HOSPITAL_BASED_OUTPATIENT_CLINIC_OR_DEPARTMENT_OTHER)
Admission: EM | Admit: 2023-05-28 | Discharge: 2023-05-28 | Disposition: A | Discharge status: Home / Self Care | Payer: Commercial Managed Care - HMO | Source: Home / Self Care | Attending: Emergency Medicine | Admitting: Emergency Medicine

## 2023-05-28 DIAGNOSIS — Z91010 Allergy to peanuts: Secondary | ICD-10-CM | POA: Insufficient documentation

## 2023-05-28 DIAGNOSIS — K5900 Constipation, unspecified: Secondary | ICD-10-CM | POA: Insufficient documentation

## 2023-05-28 DIAGNOSIS — R109 Unspecified abdominal pain: Secondary | ICD-10-CM

## 2023-05-28 MED ORDER — POLYETHYLENE GLYCOL 3350 17 G PO PACK: 17.0000 g | PACK | Freq: Every day | ORAL | 0 refills | Status: DC

## 2023-05-28 MED ORDER — BISACODYL 10 MG RE SUPP: 10.0000 mg | RECTAL | 0 refills | Status: DC | PRN

## 2023-05-28 NOTE — Discharge Instructions (Signed)
You were seen for your constipation in the emergency department.   At home, please stay well-hydrated and take the MiraLAX we have prescribed you twice a day until you start having bowel movements again.  Please continue to take the MiraLAX daily after that.  You may also use a [place] suppositories we have given you.  Check your MyChart online for the results of any tests that had not resulted by the time you left the emergency department.   Follow-up with your primary doctor in 2-3 days regarding your visit.    Return immediately to the emergency department if you experience any of the following: Severe abdominal pain, vomiting, or any other concerning symptoms.    Thank you for visiting our Emergency Department. It was a pleasure taking care of you today.

## 2023-05-28 NOTE — ED Notes (Signed)
Pt no answer x 3 in lobby, assumed LWBS after triage

## 2023-05-28 NOTE — ED Provider Notes (Signed)
[place] EMERGENCY DEPARTMENT AT [facility] Provider Note   CSN: [id number] Arrival date & time: 05/28/23  1914     History  Chief Complaint  Patient presents with   Abdominal Pain    Lynn Bond is a 57 y.o. female.  57 year old female who presents emergency department with constipation.  Had a knee surgery in May and since then has been having intermittent constipation.  Last bowel movement was on Tuesday and says that it was small and liquid.  Before that she had a bowel movement a week ago that was like hard pebbles.  Says that she will intermittently have diffuse abdominal discomfort which is relieved with passing gas.  No vomiting or nausea.  No history of abdominal surgeries.  Was taking oxycodone but stopped a week ago.  Tried MiraLAX once 2 days ago. Came to the emergency department last night but left before being seen and had lab work that was reassuring which included a CBC, CMP, lipase, and urinalysis.       Home Medications Prior to Admission medications   Medication Sig Start Date End Date Taking? Authorizing Provider  bisacodyl (DULCOLAX) 10 MG suppository Place 1 suppository (10 mg total) rectally as needed for moderate constipation. 05/28/23  Yes Rondel Baton, MD  polyethylene glycol (MIRALAX) 17 g packet Take 17 g by mouth daily. 05/28/23  Yes Rondel Baton, MD  albuterol (PROVENTIL) (2.5 MG/3ML) 0.083% nebulizer solution Take 3 mLs (2.5 mg total) by nebulization every 4 (four) hours as needed for wheezing or shortness of breath. 01/20/23 01/20/24  Doran Stabler, DO  amLODipine (NORVASC) 10 MG tablet Take 1 tablet (10 mg total) by mouth daily. Patient not taking: Reported on 04/14/2023 12/03/22 08/30/23  Doran Stabler, DO  chlorhexidine (HIBICLENS) 4 % external liquid Apply 15 mLs (1 Application total) topically as directed for 30 doses. Use as directed daily for 5 days every other week for 6 weeks. Patient not taking: Reported on 04/14/2023  02/09/23   Cecil Cobbs, PA-C  diclofenac Sodium (VOLTAREN ARTHRITIS PAIN) 1 % GEL Apply 2 g topically 4 (four) times daily. 11/05/22   Steffanie Rainwater, MD  fexofenadine (ALLEGRA) 180 MG tablet Take 1 tablet (180 mg total) by mouth daily. Patient not taking: Reported on 04/14/2023 12/19/22   Quincy Simmonds, MD  fluticasone [facility]) 50 MCG/ACT nasal spray Place 2 sprays into both nostrils daily. Patient taking differently: Place 2 sprays into both nostrils daily as needed for allergies or rhinitis. 12/19/22 03/19/23  Quincy Simmonds, MD  hydrochlorothiazide (HYDRODIURIL) 25 MG tablet Take 1 tablet (25 mg total) by mouth daily. for high blood pressure 12/03/22   Doran Stabler, DO  methocarbamol (ROBAXIN) 500 MG tablet Take 500 mg by mouth 3 (three) times daily. 02/20/23   [provider]  oxyCODONE (OXY IR/ROXICODONE) 5 MG immediate release tablet Take 5 mg by mouth. 03/05/23   [provider]  Respiratory Therapy Supplies (NEBULIZER MASK ADULT) MISC 1 each by Does not apply route daily as needed. 01/31/21   Doran Stabler, DO  topiramate (TOPAMAX) 50 MG tablet Take 1 tablet (50 mg total) by mouth 2 (two) times daily. 03/12/23 12/07/23  Rayburn, Fanny Bien, PA-C      Allergies    Benadryl [diphenhydramine hcl] and Peanut-containing drug products    Review of Systems   Review of Systems  Physical Exam Updated Vital Signs BP 127/79 (BP Location: Left Arm)   Pulse 69   Temp 97.9 F (36.6 C)  Resp 16   Ht 5\' 5"  (1.651 m)   Wt 104.8 kg   LMP 11/01/2019   SpO2 100%   BMI 38.44 kg/m  Physical Exam Constitutional:      General: She is not in acute distress.    Appearance: Normal appearance. She is not ill-appearing.  Pulmonary:     Effort: Pulmonary effort is normal.  Abdominal:     General: Abdomen is flat. There is no distension.     Palpations: Abdomen is soft. There is no mass.     Tenderness: There is no abdominal tenderness. There is no guarding.  Neurological:      Mental Status: She is alert.     ED Results / Procedures / Treatments   Labs (all labs ordered are listed, but only abnormal results are displayed) Labs Reviewed - No data to display  EKG None  Radiology DG Abdomen 1 View  Result Date: 05/28/2023 CLINICAL DATA:  upright to assess for air fluid levels, constipation. Abdominal pain. No nausea/vomiting. EXAM: ABDOMEN - 1 VIEW COMPARISON:  07/04/2005. FINDINGS: The bowel gas pattern is non-obstructive. Evaluation for air-fluid level is nondiagnostic on the supine films. Mild/physiological stool burden mainly in the left hemicolon. No evidence of pneumoperitoneum, within the limitations of a supine film. No acute osseous abnormalities. The soft tissues are within normal limits. Surgical changes, devices, tubes and lines: None. IMPRESSION: 1. Nonobstructive bowel gas pattern. Mild/physiologic stool burden. Electronically Signed   By: Jules Schick M.D.   On: 05/28/2023 09:23    Procedures Procedures    Medications Ordered in ED Medications - No data to display  ED Course/ Medical Decision Making/ A&P                                 Medical Decision Making Amount and/or Complexity of Data Reviewed Radiology: ordered.  Risk OTC drugs.   Lynn Bond is a 57 y.o. female with comorbidities that complicate the patient evaluation including recent knee surgery who presents to the emergency department constipation  Initial Ddx:  Drug-induced constipation, idiopathic constipation, ileus, bowel obstruction  MDM/Course:  Patient presents to the emergency department with constipation.  Was in the emergency department yesterday and had labs that were reassuring but left prior to being seen.  No significant nausea and vomiting that would suggest a bowel obstruction.  Was on a long course of oxycodone suspect she may have some drug-induced constipation which may be worsened by the fact that she is less mobile than usual.  She had an  upright abdominal x-ray which did not show any fluid levels.  Given the fact she is overall well-appearing and upon re-evaluation did not develop any new or concerning findings feel that she is suitable for continued outpatient treatment.  Instructed to take MiraLAX twice daily and suppositories until she starts having regular bowel movement and on the change to MiraLAX daily.  Will have her follow-up with her primary doctor in several days.  Return precautions discussed prior to discharge.  This patient presents to the ED for concern of complaints listed in HPI, this involves an extensive number of treatment options, and is a complaint that carries with it a high risk of complications and morbidity. Disposition including potential need for admission considered.   Dispo: DC Home. Return precautions discussed including, but not limited to, those listed in the AVS. Allowed pt time to ask questions which were answered fully prior  to dc.  Additional history obtained from spouse Records reviewed Outpatient Clinic Notes I independently reviewed the following imaging with scope of interpretation limited to determining acute life threatening conditions related to emergency care:  Abdominal x-ray  and agree with the radiologist interpretation with the following exceptions: none I have reviewed the patients home medications and made adjustments as needed          Final Clinical Impression(s) / ED Diagnoses Final diagnoses:  Constipation, unspecified constipation type  Abdominal discomfort    Rx / DC Orders ED Discharge Orders          Ordered    polyethylene glycol (MIRALAX) 17 g packet  Daily        05/28/23 0927    bisacodyl (DULCOLAX) 10 MG suppository  As needed        05/28/23 0927              Rondel Baton, MD 05/28/23 1810

## 2023-05-28 NOTE — ED Triage Notes (Signed)
Pt arrived POV with abdominal pain since Monday. No N/V/D. Constipation for 3 weeks-1 or 2 BM in 3 weeks. Knee surgery 02/09/2023. No stool softeners but was taking pain medication until about a week or 2 ago. Was here last night, but LWBS.

## 2023-05-31 DIAGNOSIS — H5213 Myopia, bilateral: Secondary | ICD-10-CM | POA: Diagnosis not present

## 2023-06-02 ENCOUNTER — Telehealth: Payer: Self-pay | Admitting: Physical Therapy

## 2023-06-02 NOTE — Telephone Encounter (Signed)
Pt called at preferred mobile number 2 x and message states pt is not accepting calls at number for Self [name])  Garen Lah, PT, [facility] Certified Exercise Expert for the Aging Adult  06/02/23 1:50 PM Phone: [phone] Fax: [phone]

## 2023-06-04 ENCOUNTER — Encounter: Payer: Self-pay | Admitting: Physical Therapy

## 2023-06-04 ENCOUNTER — Ambulatory Visit: Payer: Managed Care, Other (non HMO) | Attending: Orthopedic Surgery | Admitting: Physical Therapy

## 2023-06-04 ENCOUNTER — Telehealth: Payer: Self-pay | Admitting: Physical Therapy

## 2023-06-04 DIAGNOSIS — R2689 Other abnormalities of gait and mobility: Secondary | ICD-10-CM | POA: Insufficient documentation

## 2023-06-04 DIAGNOSIS — R2681 Unsteadiness on feet: Secondary | ICD-10-CM | POA: Diagnosis present

## 2023-06-04 DIAGNOSIS — M6281 Muscle weakness (generalized): Secondary | ICD-10-CM | POA: Diagnosis present

## 2023-06-04 DIAGNOSIS — G8929 Other chronic pain: Secondary | ICD-10-CM | POA: Diagnosis present

## 2023-06-04 DIAGNOSIS — M25561 Pain in right knee: Secondary | ICD-10-CM | POA: Insufficient documentation
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
## 2023-06-11 ENCOUNTER — Ambulatory Visit: Payer: Managed Care, Other (non HMO) | Admitting: Physical Therapy

## 2023-06-12 ENCOUNTER — Other Ambulatory Visit: Payer: Self-pay | Admitting: *Deleted

## 2023-06-12 DIAGNOSIS — J4530 Mild persistent asthma, uncomplicated: Secondary | ICD-10-CM

## 2023-06-12 MED ORDER — ALBUTEROL SULFATE (2.5 MG/3ML) 0.083% IN NEBU
2.5000 mg | INHALATION_SOLUTION | RESPIRATORY_TRACT | 2 refills | Status: DC | PRN
Start: 2023-06-12 — End: 2023-10-05

## 2023-06-12 MED ORDER — ALBUTEROL SULFATE HFA 108 (90 BASE) MCG/ACT IN AERS: 2.0000 | INHALATION_SPRAY | Freq: Four times a day (QID) | RESPIRATORY_TRACT | 2 refills | Status: DC | PRN

## 2023-06-12 NOTE — Telephone Encounter (Signed)
Pt states with the change in the weather, it's affecting her asthma. She needs a refill on the Albuterol inhaler and albuterol neb solution. Also an appt has been schedule w/Dr Carron Brazen on 07/06/23. Thanks

## 2023-06-12 NOTE — Telephone Encounter (Signed)
Pt also requesting refill on Albuterol inhaler.

## 2023-06-12 NOTE — Addendum Note (Signed)
Addended by: Modena Slater on: 06/12/2023 12:45 PM   Modules accepted: Orders

## 2023-06-18 ENCOUNTER — Encounter: Payer: Commercial Managed Care - HMO | Admitting: Physical Therapy

## 2023-07-05 NOTE — Progress Notes (Deleted)
CC: Follow-up on hypertension, and asthma.  HPI:  [name] is a 57 y.o. female living with a history stated below and presents today for follow-up hypertension and asthma.Please see problem based assessment and plan for additional details.  Past Medical History:  Diagnosis Date   Arthritis    Asthma    ECZEMA 03/09/2009   Qualifier: Diagnosis of  By: Andrey Campanile  MD, Valerie     GERD 03/09/2009   Qualifier: Diagnosis of  By: Andrey Campanile  MD, Valerie     Hypertension    Pre-diabetes     Current Outpatient Medications on File Prior to Visit  Medication Sig Dispense Refill   albuterol (PROVENTIL) (2.5 MG/3ML) 0.083% nebulizer solution Take 3 mLs (2.5 mg total) by nebulization every 4 (four) hours as needed for wheezing or shortness of breath. 75 mL 2   albuterol (VENTOLIN HFA) 108 (90 Base) MCG/ACT inhaler Inhale 2 puffs into the lungs every 6 (six) hours as needed for wheezing or shortness of breath. 8 g 2   amLODipine (NORVASC) 10 MG tablet Take 1 tablet (10 mg total) by mouth daily. (Patient not taking: Reported on 04/14/2023) 90 tablet 2   bisacodyl (DULCOLAX) 10 MG suppository Place 1 suppository (10 mg total) rectally as needed for moderate constipation. 12 suppository 0   chlorhexidine (HIBICLENS) 4 % external liquid Apply 15 mLs (1 Application total) topically as directed for 30 doses. Use as directed daily for 5 days every other week for 6 weeks. (Patient not taking: Reported on 04/14/2023) 946 mL 1   diclofenac Sodium (VOLTAREN ARTHRITIS PAIN) 1 % GEL Apply 2 g topically 4 (four) times daily. 100 g 2   fexofenadine (ALLEGRA) 180 MG tablet Take 1 tablet (180 mg total) by mouth daily. (Patient not taking: Reported on 04/14/2023) 90 tablet 2   fluticasone (FLONASE) 50 MCG/ACT nasal spray Place 2 sprays into both nostrils daily. (Patient taking differently: Place 2 sprays into both nostrils daily as needed for allergies or rhinitis.) 11.1 mL 2   hydrochlorothiazide (HYDRODIURIL)  25 MG tablet Take 1 tablet (25 mg total) by mouth daily. for high blood pressure 90 tablet 2   methocarbamol (ROBAXIN) 500 MG tablet Take 500 mg by mouth 3 (three) times daily.     oxyCODONE (OXY IR/ROXICODONE) 5 MG immediate release tablet Take 5 mg by mouth.     polyethylene glycol (MIRALAX) 17 g packet Take 17 g by mouth daily. 14 each 0   Respiratory Therapy Supplies (NEBULIZER MASK ADULT) MISC 1 each by Does not apply route daily as needed. 1 each 0   topiramate (TOPAMAX) 50 MG tablet Take 1 tablet (50 mg total) by mouth 2 (two) times daily. 180 tablet 2   No current facility-administered medications on file prior to visit.     Review of Systems: ROS negative except for what is noted on the assessment and plan.  There were no vitals filed for this visit.  Physical Exam: Constitutional: Well appearing, not in acute distress. Cardiovascular: regular rate and rhythm, no m/r/g Pulmonary/Chest: normal work of breathing on room air, lungs clear to auscultation bilaterally Abdominal: soft, non-tender, non-distended MSK: Normal range of motion. No effusion, no pain to palpation.  Assessment & Plan:   Essential hypertension, benign Blood pressure is well-controlled ****   -Continue HCTZ 25 mg  - amlodipine 10 mg daily - BMP @ NEXT OV    Osteoarthritis of both knees  Patient is doing much better with the right knee arthritis.  She has lost ***** lbs in the last few months by working with water aerobic exercise at the gym.    She completed right hip replacement of this year.  a BMI of ***. She is applying for Medicaid to get her teeth removed before her right knee replacement (per orthopedic).    Asthma  ***PF? On my lung exams, lungs clear bilaterally.  - albuterol neb and inhaler.   Obesity, unspecified Good progress in the last few months.  Continue Topamax and following weight loss clinic   Healthcare maintenance - Flu shot   -Patient cannot tolerate frog-leg position  due to right knee pain so we will hold off on Pap smear   Patient {GC/GE:[id number]::"discussed with","seen with"} Dr. {WGNFA:[id number]::"HQIONGEX","B. Hoffman","Mullen","Narendra","Vincent","Guilloud","Lau","Machen"}  No problem-specific Assessment & Plan notes found for this encounter.   Laretta Bolster, MD [facility] Internal Medicine, PGY-1 Phone: [phone] Date 07/05/2023 Time 6:57 PM

## 2023-07-06 ENCOUNTER — Encounter: Payer: Commercial Managed Care - HMO | Admitting: Student

## 2023-07-30 ENCOUNTER — Encounter: Payer: Self-pay | Admitting: Student

## 2023-07-30 ENCOUNTER — Ambulatory Visit (INDEPENDENT_AMBULATORY_CARE_PROVIDER_SITE_OTHER): Payer: Managed Care, Other (non HMO) | Admitting: Student

## 2023-07-30 VITALS — BP 140/86 | HR 80 | Temp 98.2°F | Ht 65.0 in | Wt 258.8 lb

## 2023-07-30 DIAGNOSIS — Z6841 Body Mass Index (BMI) 40.0 and over, adult: Secondary | ICD-10-CM

## 2023-07-30 DIAGNOSIS — I10 Essential (primary) hypertension: Secondary | ICD-10-CM

## 2023-07-30 MED ORDER — OZEMPIC (0.25 OR 0.5 MG/DOSE) 2 MG/1.5ML ~~LOC~~ SOPN: 0.2500 mg | PEN_INJECTOR | SUBCUTANEOUS | 3 refills | Status: DC

## 2023-07-30 MED ORDER — HYDROCHLOROTHIAZIDE 25 MG PO TABS: 25.0000 mg | ORAL_TABLET | Freq: Every day | ORAL | 2 refills | Status: DC

## 2023-07-30 MED ORDER — AMLODIPINE BESYLATE 10 MG PO TABS
10.0000 mg | ORAL_TABLET | Freq: Every day | ORAL | 2 refills | Status: DC
Start: 2023-07-30 — End: 2024-06-20

## 2023-07-30 NOTE — Patient Instructions (Signed)
Thank you so much for coming to the clinic today!   I am refilling your medications, as well as sending in the Ozempic. If it is too expensive you don't need to pick it up.   If you have any questions please feel free to the call the clinic at anytime at [phone]. It was a pleasure seeing you!  Best, Dr. Thomasene Ripple

## 2023-07-31 ENCOUNTER — Other Ambulatory Visit: Payer: Managed Care, Other (non HMO)

## 2023-07-31 ENCOUNTER — Telehealth: Payer: Self-pay | Admitting: *Deleted

## 2023-07-31 DIAGNOSIS — Z1211 Encounter for screening for malignant neoplasm of colon: Secondary | ICD-10-CM

## 2023-07-31 NOTE — Assessment & Plan Note (Signed)
Patient presents for follow-up regarding her blood pressure.  Her pressure in clinic is 135/49.  She does state that she checks her pressure at home is usually in the 120s to 130s systolic.  She denies any symptoms of chest pain, shortness of breath or blurry vision.  Will continue her current regiment of hydrochlorothiazide 25 mg and Norvasc 10 mg.

## 2023-07-31 NOTE — Assessment & Plan Note (Signed)
Patient has been attempting to lose weight for the last year or so.  She started a weight loss program where she was initially to 98, and then went to 220.  After her knee surgery in May, since she was not able to mobilize as well her weight increased to 258.  She was taking Topamax to help with weight loss however discussed with her that there is not a lot of benefit regarding Topamax when it comes to weight loss.  Will trial Ozempic, barring insurance restraints, but overall educated patient about weight loss techniques and diet and exercise.

## 2023-07-31 NOTE — Progress Notes (Signed)
CC: HTN follow up  HPI:  [name] is a 57 y.o. female living with a history stated below and presents today for HTN follow up. Please see problem based assessment and plan for additional details.  Past Medical History:  Diagnosis Date   Achilles tendinitis of left lower extremity 05/11/2019   Arthritis    Asthma    ECZEMA 03/09/2009   Qualifier: Diagnosis of  By: Andrey Campanile  MD, Valerie     Eczema 02/13/2015   GERD 03/09/2009   Qualifier: Diagnosis of  By: Andrey Campanile  MD, Valerie     Hypertension    Obesity, unspecified 10/11/2008   Qualifier: Diagnosis of   By: Andrey Campanile  MD, Frutoso Schatz     Current Outpatient Medications on File Prior to Visit  Medication Sig Dispense Refill   albuterol (PROVENTIL) (2.5 MG/3ML) 0.083% nebulizer solution Take 3 mLs (2.5 mg total) by nebulization every 4 (four) hours as needed for wheezing or shortness of breath. 75 mL 2   albuterol (VENTOLIN HFA) 108 (90 Base) MCG/ACT inhaler Inhale 2 puffs into the lungs every 6 (six) hours as needed for wheezing or shortness of breath. 8 g 2   fexofenadine (ALLEGRA) 180 MG tablet Take 1 tablet (180 mg total) by mouth daily. (Patient not taking: Reported on 04/14/2023) 90 tablet 2   fluticasone (FLONASE) 50 MCG/ACT nasal spray Place 2 sprays into both nostrils daily. (Patient taking differently: Place 2 sprays into both nostrils daily as needed for allergies or rhinitis.) 11.1 mL 2   Respiratory Therapy Supplies (NEBULIZER MASK ADULT) MISC 1 each by Does not apply route daily as needed. 1 each 0   topiramate (TOPAMAX) 50 MG tablet Take 1 tablet (50 mg total) by mouth 2 (two) times daily. 180 tablet 2   No current facility-administered medications on file prior to visit.    Family History  Problem Relation Age of Onset   Hypertension Mother    Cancer Father    Colon polyps Brother    Colon cancer Neg Hx    Esophageal cancer Neg Hx    Prostate cancer Neg Hx    Rectal cancer Neg Hx      Social History   Socioeconomic History   Marital status: Married    Spouse name: Not on file   Number of children: Not on file   Years of education: Not on file   Highest education level: Not on file  Occupational History   Not on file  Tobacco Use   Smoking status: Former    Current packs/day: 0.00    Types: Cigarettes    Quit date: 10/20/1988    Years since quitting: 34.8   Smokeless tobacco: Never   Tobacco comments:    quit 24yrs ago  Vaping Use   Vaping status: Never Used  Substance and Sexual Activity   Alcohol use: No    Alcohol/week: 0.0 standard drinks of alcohol   Drug use: No   Sexual activity: Yes  Other Topics Concern   Not on file  Social History Narrative   Works at [employer]    Social Determinants of Health   Financial Resource Strain: Low Risk  (11/05/2022)   Overall Financial Resource Strain (CARDIA)    Difficulty of Paying Living Expenses: Not hard at all  Food Insecurity: No Food Insecurity (11/05/2022)   Hunger Vital Sign    Worried About Running Out of Food in the Last Year:  Never true    Ran Out of Food in the Last Year: Never true  Transportation Needs: No Transportation Needs (11/05/2022)   PRAPARE - Administrator, Civil Service (Medical): No    Lack of Transportation (Non-Medical): No  Physical Activity: Inactive (11/05/2022)   Exercise Vital Sign    Days of Exercise per Week: 0 days    Minutes of Exercise per Session: 0 min  Stress: No Stress Concern Present (11/05/2022)   [employer] of Occupational Health - Occupational Stress Questionnaire    Feeling of Stress : Not at all  Social Connections: Moderately Isolated (11/05/2022)   Social Connection and Isolation Panel [NHANES]    Frequency of Communication with Friends and Family: More than three times a week    Frequency of Social Gatherings with Friends and Family: More than three times a week    Attends Religious Services: More than 4 times per year    Active  Member of [employer] or Organizations: No    Attends Banker Meetings: Never    Marital Status: Divorced  Catering manager Violence: Not At Risk (11/05/2022)   Humiliation, Afraid, Rape, and Kick questionnaire    Fear of Current or Ex-Partner: No    Emotionally Abused: No    Physically Abused: No    Sexually Abused: No    Review of Systems: ROS negative except for what is noted on the assessment and plan.  Vitals:   07/30/23 1602 07/30/23 1626  BP: (!) 135/94 (!) 140/86  Pulse: 79 80  Temp: 98.2 F (36.8 C)   TempSrc: Oral   SpO2: 100%   Weight: 258 lb 12.8 oz (117.4 kg)   Height: 5\' 5"  (1.651 m)     Physical Exam: Constitutional: well-appearing female  in no acute distress Cardiovascular: regular rate and rhythm, no m/r/g Pulmonary/Chest: normal work of breathing on room air, lungs clear to auscultation bilaterally Abdominal: soft, non-tender, non-distended MSK: normal bulk and tone Neurological: alert & oriented x 3, 5/5 strength in bilateral upper and lower extremities, normal gait   Assessment & Plan:   Essential hypertension Patient presents for follow-up regarding her blood pressure.  Her pressure in clinic is 135/49.  She does state that she checks her pressure at home is usually in the 120s to 130s systolic.  She denies any symptoms of chest pain, shortness of breath or blurry vision.  Will continue her current regiment of hydrochlorothiazide 25 mg and Norvasc 10 mg.  Morbid obesity (HCC)-starting bmi 42.29 Patient has been attempting to lose weight for the last year or so.  She started a weight loss program where she was initially to 98, and then went to 220.  After her knee surgery in May, since she was not able to mobilize as well her weight increased to 258.  She was taking Topamax to help with weight loss however discussed with her that there is not a lot of benefit regarding Topamax when it comes to weight loss.  Will trial Ozempic, barring insurance  restraints, but overall educated patient about weight loss techniques and diet and exercise.  Patient discussed with Dr. Maryagnes Amos [name], M.D. [facility] Internal Medicine, PGY-2 Pager: [phone] Date 07/31/2023 Time 5:09 PM

## 2023-08-02 LAB — FECAL OCCULT BLOOD, IMMUNOCHEMICAL: Fecal Occult Bld: NEGATIVE

## 2023-08-03 ENCOUNTER — Other Ambulatory Visit: Payer: Self-pay | Admitting: Student

## 2023-08-03 DIAGNOSIS — J301 Allergic rhinitis due to pollen: Secondary | ICD-10-CM

## 2023-08-03 DIAGNOSIS — J4530 Mild persistent asthma, uncomplicated: Secondary | ICD-10-CM

## 2023-08-03 NOTE — Telephone Encounter (Signed)
Prior Authorization for patient (Ozempic 0.25 or 0.5 MG) came through on cover my meds was submitted with last office notes awaiting approval or denial.  ONG:[id number]

## 2023-08-03 NOTE — Progress Notes (Signed)
 Internal Medicine Clinic Attending  Case discussed with the resident physician at the time of the visit.  We reviewed the patient's history, exam, and pertinent patient test results.  I agree with the assessment, diagnosis, and plan of care documented in the resident's note.

## 2023-08-04 ENCOUNTER — Telehealth: Payer: Self-pay

## 2023-08-04 NOTE — Telephone Encounter (Signed)
Prior Authorization for patient (Ozempic 0.25 or 0.5 mg) came through on cover my meds was submitted with last office notes awaiting approval or denial.   NWG:[id number]

## 2023-08-04 NOTE — Telephone Encounter (Signed)
Decision:Denied  Based on the information provided, I am unable to approve coverage for this medication because: This request is denied because the medication requested is not currently FDA approved for weight  loss. Note that this individual's benefit plan does not cover weight loss medications. Ozempic (semaglutide) is considered medically necessary when the individual meets all of the  following criteria (1, 2, and 3): 1. Documented diagnosis of Type 2 diabetes mellitus; 2. Both of the  following (A and B): A. Documentation of one the following (i, ii, iii, iv, v, or vi): i. Unable to achieve  goal HbA1C despite metformin or metformin-containing regimen (meglitinides, sulfonylureas, or  thiazolidinediones) at greater than or equal to 1,500 mg per day; ii. Intolerance to metformin 1,500  mg per day despite appropriate dose titration duration (for example, period of 8-12 weeks); iii.  Contraindication to metformin per FDA label (for example, acute/chronic metabolic acidosis, severe  renal dysfunction); iv. Not a candidate for metformin (for example, hepatic impairment, moderate  renal dysfunction, unstable heart failure, individual is using an agent for a non-diabetic [facility] on behalf of [address] [employer]. approved indication); v. Initial metformin combination therapy is clinically appropriate for elevated  HbA1C (for example, HbA1C greater than 1.5% above goal); or vi. Initial metformin combination  therapy is clinically appropriate in an individual with co-morbid conditions (such as ASCVD, heart  failure, or CKD); and B. Continues maximally tolerated metformin therapy, if not contraindicated per  FDA label, intolerant, or otherwise not a candidate; and 3. Documented failure, contraindication or  intolerance to Trulicity (dulaglutide). Rosann Auerbach does not cover Ozempic for any other use because it is  only approved for type 2 diabetes mellitus.

## 2023-08-08 ENCOUNTER — Other Ambulatory Visit: Payer: Self-pay

## 2023-08-08 ENCOUNTER — Encounter (HOSPITAL_BASED_OUTPATIENT_CLINIC_OR_DEPARTMENT_OTHER): Payer: Self-pay | Admitting: Emergency Medicine

## 2023-08-08 ENCOUNTER — Emergency Department (HOSPITAL_BASED_OUTPATIENT_CLINIC_OR_DEPARTMENT_OTHER)
Admission: EM | Admit: 2023-08-08 | Discharge: 2023-08-08 | Disposition: A | Discharge status: Home / Self Care | Payer: Commercial Managed Care - HMO | Attending: Emergency Medicine | Admitting: Emergency Medicine

## 2023-08-08 ENCOUNTER — Emergency Department (HOSPITAL_BASED_OUTPATIENT_CLINIC_OR_DEPARTMENT_OTHER): Payer: Commercial Managed Care - HMO | Admitting: Radiology

## 2023-08-08 DIAGNOSIS — Y92410 Unspecified street and highway as the place of occurrence of the external cause: Secondary | ICD-10-CM | POA: Insufficient documentation

## 2023-08-08 DIAGNOSIS — S8991XA Unspecified injury of right lower leg, initial encounter: Secondary | ICD-10-CM | POA: Diagnosis present

## 2023-08-08 DIAGNOSIS — Z79899 Other long term (current) drug therapy: Secondary | ICD-10-CM | POA: Insufficient documentation

## 2023-08-08 DIAGNOSIS — S8001XA Contusion of right knee, initial encounter: Secondary | ICD-10-CM | POA: Insufficient documentation

## 2023-08-08 DIAGNOSIS — Z91010 Allergy to peanuts: Secondary | ICD-10-CM | POA: Diagnosis not present
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
## 2023-08-14 ENCOUNTER — Other Ambulatory Visit: Payer: Self-pay

## 2023-08-14 ENCOUNTER — Emergency Department (HOSPITAL_COMMUNITY)
Admission: EM | Admit: 2023-08-14 | Discharge: 2023-08-14 | Disposition: A | Discharge status: Home / Self Care | Payer: Commercial Managed Care - HMO | Attending: Emergency Medicine | Admitting: Emergency Medicine

## 2023-08-14 ENCOUNTER — Encounter (HOSPITAL_COMMUNITY): Payer: Self-pay | Admitting: Emergency Medicine

## 2023-08-14 DIAGNOSIS — Z91010 Allergy to peanuts: Secondary | ICD-10-CM | POA: Insufficient documentation

## 2023-08-14 DIAGNOSIS — T782XXA Anaphylactic shock, unspecified, initial encounter: Secondary | ICD-10-CM | POA: Insufficient documentation

## 2023-08-14 DIAGNOSIS — R0602 Shortness of breath: Secondary | ICD-10-CM | POA: Insufficient documentation

## 2023-08-14 DIAGNOSIS — J45909 Unspecified asthma, uncomplicated: Secondary | ICD-10-CM | POA: Diagnosis not present

## 2023-08-14 DIAGNOSIS — Z79899 Other long term (current) drug therapy: Secondary | ICD-10-CM | POA: Diagnosis not present

## 2023-08-14 DIAGNOSIS — Z7952 Long term (current) use of systemic steroids: Secondary | ICD-10-CM | POA: Diagnosis not present

## 2023-08-14 DIAGNOSIS — I10 Essential (primary) hypertension: Secondary | ICD-10-CM | POA: Insufficient documentation

## 2023-08-14 DIAGNOSIS — T7840XA Allergy, unspecified, initial encounter: Secondary | ICD-10-CM | POA: Diagnosis present

## 2023-08-14 MED ORDER — METHYLPREDNISOLONE SODIUM SUCC 125 MG IJ SOLR
125.0000 mg | Freq: Once | INTRAMUSCULAR | Status: AC
  Administered 2023-08-14: 125 mg via INTRAVENOUS
  Filled 2023-08-14: qty 2

## 2023-08-14 MED ORDER — FAMOTIDINE IN NACL 20-0.9 MG/50ML-% IV SOLN
20.0000 mg | Freq: Once | INTRAVENOUS | Status: AC
  Administered 2023-08-14: 20 mg via INTRAVENOUS
  Filled 2023-08-14: qty 50

## 2023-08-14 MED ORDER — EPINEPHRINE 0.3 MG/0.3ML IJ SOAJ: 0.3000 mg | INTRAMUSCULAR | 0 refills | Status: AC | PRN

## 2023-08-14 MED ORDER — EPINEPHRINE 0.3 MG/0.3ML IJ SOAJ
0.3000 mg | Freq: Once | INTRAMUSCULAR | Status: AC
  Administered 2023-08-14: 0.3 mg via INTRAMUSCULAR
  Filled 2023-08-14: qty 0.3

## 2023-08-14 MED ORDER — FAMOTIDINE 20 MG PO TABS: 20.0000 mg | ORAL_TABLET | Freq: Two times a day (BID) | ORAL | 0 refills | Status: DC

## 2023-08-14 MED ORDER — PREDNISONE 50 MG PO TABS: 50.0000 mg | ORAL_TABLET | Freq: Every day | ORAL | 0 refills | Status: DC

## 2023-08-14 MED ORDER — IPRATROPIUM-ALBUTEROL 0.5-2.5 (3) MG/3ML IN SOLN
3.0000 mL | Freq: Once | RESPIRATORY_TRACT | Status: AC
  Administered 2023-08-14: 3 mL via RESPIRATORY_TRACT
  Filled 2023-08-14: qty 3

## 2023-08-14 NOTE — ED Triage Notes (Signed)
Pt reports while eating she became [facility] and feels like her throat is closing. Pt is allergic to nuts.

## 2023-08-14 NOTE — ED Notes (Signed)
Pt in NAD at d/c from ED. A&o. Ambulatory. Respirations even & unlabored. Skin warm & dry. Pt reports symptoms have greatly improved. Pt verbalized understanding of d/c teaching including follow up care, medications and reasons to return to the ED. No needs or questions expressed at d/c.

## 2023-08-14 NOTE — ED Provider Notes (Signed)
[place] EMERGENCY DEPARTMENT AT [facility] Provider Note   CSN: [id number] Arrival date & time: 08/14/23  1837     History  Chief Complaint  Patient presents with   Allergic Reaction    Lynn Bond is a 57 y.o. female.   Allergic Reaction    Patient has a history of asthma hypertension acid reflux and a not allergy.  Patient states she was eating food from [employer].  She had some candied yams.  Patient states after eating it she started to notice allergic sensation in her mouth.  She called [employer] and they indicated there were knots in the yams.  Patient came to the ED with complaints of shortness of breath feeling like her throat was closing.  At triage she was given epinephrine albuterol Solu-Medrol Pepcid.  Patient noted significant improvement.  Home Medications Prior to Admission medications   Medication Sig Start Date End Date Taking? Authorizing Provider  EPINEPHrine 0.3 mg/0.3 mL IJ SOAJ injection Inject 0.3 mg into the muscle as needed for anaphylaxis. 08/14/23  Yes Linwood Dibbles, MD  famotidine (PEPCID) 20 MG tablet Take 1 tablet (20 mg total) by mouth 2 (two) times daily. 08/14/23  Yes Linwood Dibbles, MD  predniSONE (DELTASONE) 50 MG tablet Take 1 tablet (50 mg total) by mouth daily. 08/14/23  Yes Linwood Dibbles, MD  albuterol (PROVENTIL) (2.5 MG/3ML) 0.083% nebulizer solution Take 3 mLs (2.5 mg total) by nebulization every 4 (four) hours as needed for wheezing or shortness of breath. 06/12/23 06/11/24  Nooruddin, Jason Fila, MD  albuterol (VENTOLIN HFA) 108 (90 Base) MCG/ACT inhaler Inhale 2 puffs into the lungs every 6 (six) hours as needed for wheezing or shortness of breath. 06/12/23   Modena Slater, DO  amLODipine (NORVASC) 10 MG tablet Take 1 tablet (10 mg total) by mouth daily. 07/30/23 04/25/24  Nooruddin, Jason Fila, MD  fexofenadine (ALLEGRA) 180 MG tablet Take 1 tablet (180 mg total) by mouth daily. Patient not taking: Reported on 04/14/2023 12/19/22   Quincy Simmonds, MD   fluticasone [facility]) 50 MCG/ACT nasal spray Place 2 sprays into both nostrils daily. Patient taking differently: Place 2 sprays into both nostrils daily as needed for allergies or rhinitis. 12/19/22 03/19/23  Quincy Simmonds, MD  hydrochlorothiazide (HYDRODIURIL) 25 MG tablet Take 1 tablet (25 mg total) by mouth daily. for high blood pressure 07/30/23   Nooruddin, Jason Fila, MD  Respiratory Therapy Supplies (NEBULIZER MASK ADULT) MISC 1 each by Does not apply route daily as needed. 01/31/21   Doran Stabler, DO  Semaglutide,0.25 or 0.5MG /DOS, (OZEMPIC, 0.25 OR 0.5 MG/DOSE,) 2 MG/1.5ML SOPN Inject 0.25 mg into the skin once a week. 07/30/23   Nooruddin, Jason Fila, MD  topiramate (TOPAMAX) 50 MG tablet Take 1 tablet (50 mg total) by mouth 2 (two) times daily. 03/12/23 12/07/23  Rayburn, Fanny Bien, PA-C      Allergies    Benadryl [diphenhydramine hcl] and Peanut-containing drug products    Review of Systems   Review of Systems  Physical Exam Updated Vital Signs BP 119/85   Pulse 80   Temp 98.9 F (37.2 C) (Oral)   Resp 16   LMP 11/01/2019   SpO2 100%  Physical Exam Vitals and nursing note reviewed.  Constitutional:      Appearance: She is well-developed. She is not diaphoretic.  HENT:     Head: Normocephalic and atraumatic.     Right Ear: External ear normal.     Left Ear: External ear normal.  Eyes:  General: No scleral icterus.       Right eye: No discharge.        Left eye: No discharge.     Conjunctiva/sclera: Conjunctivae normal.  Neck:     Trachea: No tracheal deviation.  Cardiovascular:     Rate and Rhythm: Normal rate and regular rhythm.  Pulmonary:     Effort: Pulmonary effort is normal. No respiratory distress.     Breath sounds: Normal breath sounds. No stridor. No wheezing or rales.  Abdominal:     General: Bowel sounds are normal. There is no distension.     Palpations: Abdomen is soft.     Tenderness: There is no abdominal tenderness. There is no guarding or  rebound.  Musculoskeletal:        General: No tenderness or deformity.     Cervical back: Neck supple.  Skin:    General: Skin is warm and dry.     Findings: No rash.  Neurological:     General: No focal deficit present.     Mental Status: She is alert.     Cranial Nerves: No cranial nerve deficit, dysarthria or facial asymmetry.     Sensory: No sensory deficit.     Motor: No abnormal muscle tone or seizure activity.     Coordination: Coordination normal.  Psychiatric:        Mood and Affect: Mood normal.     ED Results / Procedures / Treatments   Labs (all labs ordered are listed, but only abnormal results are displayed) Labs Reviewed - No data to display  EKG None  Radiology No results found.  Procedures Procedures    Medications Ordered in ED Medications  ipratropium-albuterol (DUONEB) 0.5-2.5 (3) MG/3ML nebulizer solution 3 mL (3 mLs Nebulization Given 08/14/23 1853)  EPINEPHrine (EPI-PEN) injection 0.3 mg (0.3 mg Intramuscular Given 08/14/23 1850)  methylPREDNISolone sodium succinate (SOLU-MEDROL) 125 mg/2 mL injection 125 mg (125 mg Intravenous Given 08/14/23 1852)  famotidine (PEPCID) IVPB 20 mg premix (0 mg Intravenous Stopped 08/14/23 2103)    ED Course/ Medical Decision Making/ A&P                                 Medical Decision Making Problems Addressed: Anaphylaxis, initial encounter: acute illness or injury that poses a threat to life or bodily functions  Risk Prescription drug management.   Patient presented to the ED for evaluation of a severe allergic reaction.  Patient has history of nut allergy.  She ate at a restaurant and accidentally ate food that contained nuts.  Patient was feeling short of breath on arrival in the ED.  She was treated with an EpiPen steroids and Pepcid.  Patient has a Benadryl allergy.  Patient noted significant improvement.  She was monitored in the ED and did not have any recurrence of her symptoms.  Will discharge home  with course of steroids.  Patient states she can take Claritin recommend she take that medication as well as the Pepcid and prednisone.  Will give a prescription for an EpiPen to carry around with her.  Evaluation and diagnostic testing in the emergency department does not suggest an emergent condition requiring admission or immediate intervention beyond what has been performed at this time.  The patient is safe for discharge and has been instructed to return immediately for worsening symptoms, change in symptoms or any other concerns.        Final Clinical  Impression(s) / ED Diagnoses Final diagnoses:  Anaphylaxis, initial encounter    Rx / DC Orders ED Discharge Orders          Ordered    EPINEPHrine 0.3 mg/0.3 mL IJ SOAJ injection  As needed        08/14/23 2146    predniSONE (DELTASONE) 50 MG tablet  Daily        08/14/23 2146    famotidine (PEPCID) 20 MG tablet  2 times daily        08/14/23 2146              Linwood Dibbles, MD 08/14/23 2149

## 2023-08-14 NOTE — Discharge Instructions (Addendum)
Take the steroids and Pepcid as prescribed.  Also take over-the-counter Claritin for the next 5 days.  Keep the EpiPen with you at all times in case you have another severe reaction in the future.  If you experience a severe allergic reaction give yourself the EpiPen and call 911.

## 2023-08-14 NOTE — ED Provider Triage Note (Signed)
Emergency Medicine Provider Triage Evaluation Note  Lynn Bond , a 57 y.o. female  was evaluated in triage.  Pt complains of difficulty breathing, throat tight.  Pt reports exposure to peanuts.    Review of Systems  Positive: Throat tightness Negative: Fever, cough   Physical Exam  BP (!) 140/115 (BP Location: Right Arm)   Pulse 91   Resp (!) 32   LMP 11/01/2019   SpO2 100%  Gen:   Anxious,  Resp:  Tachpnea.  MSK:   Moves extremities without difficulty  Other:    Medical Decision Making  Medically screening exam initiated at 6:47 PM.  Appropriate orders placed.  Lynn Bond was informed that the remainder of the evaluation will be completed by another provider, this initial triage assessment does not replace that evaluation, and the importance of remaining in the ED until their evaluation is complete. Pt given Epi IM, albuterol, solumedrol, pepcid ordered.  Pt is allergic to benadryl.    Lynn Bond, [place] 08/14/23 1850

## 2023-09-03 ENCOUNTER — Encounter: Payer: Self-pay | Admitting: *Deleted

## 2023-09-26 ENCOUNTER — Other Ambulatory Visit: Payer: Self-pay | Admitting: Student

## 2023-10-04 ENCOUNTER — Other Ambulatory Visit: Payer: Self-pay | Admitting: Internal Medicine

## 2023-10-22 DIAGNOSIS — Z96651 Presence of right artificial knee joint: Secondary | ICD-10-CM | POA: Diagnosis not present

## 2023-10-22 DIAGNOSIS — Z6841 Body Mass Index (BMI) 40.0 and over, adult: Secondary | ICD-10-CM | POA: Diagnosis not present

## 2023-10-22 DIAGNOSIS — Z79899 Other long term (current) drug therapy: Secondary | ICD-10-CM | POA: Diagnosis not present

## 2023-10-22 DIAGNOSIS — G8929 Other chronic pain: Secondary | ICD-10-CM | POA: Diagnosis not present

## 2023-10-22 DIAGNOSIS — M25562 Pain in left knee: Secondary | ICD-10-CM | POA: Diagnosis not present

## 2023-10-22 DIAGNOSIS — M25561 Pain in right knee: Secondary | ICD-10-CM | POA: Diagnosis not present

## 2023-11-09 ENCOUNTER — Telehealth: Payer: Self-pay

## 2023-11-09 NOTE — Telephone Encounter (Signed)
 Your request has been n/a General.Closed by health plan.We thank you for taking the time to submit your request. However, this member has alternative pharmacy benefits. [employer]  is the payer of last resort. Please have the pharmacy bill the member's other insurance plan first. If the member feels that this is in error, please have the member call Member Services at [phone]. If the requested medication is not covered or was denied by the [facility] primary insurance, an explanation of benefits or proof of denial must be submitted with the request prior to coverage by [employer] .  Information has been faxed to the pharmacy.

## 2023-11-09 NOTE — Telephone Encounter (Signed)
 Prior Authorization for patient (Albuterol  Sulfate HFA 108 (90 Base)MCG/ACT aerosol) came through on cover my meds was submitted with last office notes awaiting approval or denial.  KEY: [id number]

## 2023-11-23 DIAGNOSIS — M25561 Pain in right knee: Secondary | ICD-10-CM | POA: Diagnosis not present

## 2023-11-23 DIAGNOSIS — Z96651 Presence of right artificial knee joint: Secondary | ICD-10-CM | POA: Diagnosis not present

## 2023-11-23 DIAGNOSIS — M25562 Pain in left knee: Secondary | ICD-10-CM | POA: Diagnosis not present

## 2023-11-23 DIAGNOSIS — Z79899 Other long term (current) drug therapy: Secondary | ICD-10-CM | POA: Diagnosis not present

## 2023-11-23 DIAGNOSIS — M1712 Unilateral primary osteoarthritis, left knee: Secondary | ICD-10-CM | POA: Diagnosis not present

## 2023-11-23 DIAGNOSIS — Z6841 Body Mass Index (BMI) 40.0 and over, adult: Secondary | ICD-10-CM | POA: Diagnosis not present

## 2023-11-24 ENCOUNTER — Emergency Department (HOSPITAL_BASED_OUTPATIENT_CLINIC_OR_DEPARTMENT_OTHER): Payer: Medicaid Other | Admitting: Radiology

## 2023-11-24 ENCOUNTER — Encounter (HOSPITAL_BASED_OUTPATIENT_CLINIC_OR_DEPARTMENT_OTHER): Payer: Self-pay | Admitting: Emergency Medicine

## 2023-11-24 ENCOUNTER — Observation Stay (HOSPITAL_BASED_OUTPATIENT_CLINIC_OR_DEPARTMENT_OTHER)
Admission: EM | Admit: 2023-11-24 | Discharge: 2023-11-26 | Disposition: A | Discharge status: Home / Self Care | Payer: Medicaid Other | Attending: Family Medicine | Admitting: Family Medicine

## 2023-11-24 DIAGNOSIS — S43004A Unspecified dislocation of right shoulder joint, initial encounter: Secondary | ICD-10-CM | POA: Diagnosis not present

## 2023-11-24 DIAGNOSIS — J45909 Unspecified asthma, uncomplicated: Secondary | ICD-10-CM | POA: Diagnosis not present

## 2023-11-24 DIAGNOSIS — Z96651 Presence of right artificial knee joint: Secondary | ICD-10-CM | POA: Insufficient documentation

## 2023-11-24 DIAGNOSIS — Y93H2 Activity, gardening and landscaping: Secondary | ICD-10-CM | POA: Diagnosis not present

## 2023-11-24 DIAGNOSIS — I10 Essential (primary) hypertension: Secondary | ICD-10-CM | POA: Diagnosis not present

## 2023-11-24 DIAGNOSIS — W010XXA Fall on same level from slipping, tripping and stumbling without subsequent striking against object, initial encounter: Secondary | ICD-10-CM | POA: Insufficient documentation

## 2023-11-24 DIAGNOSIS — Z79899 Other long term (current) drug therapy: Secondary | ICD-10-CM | POA: Insufficient documentation

## 2023-11-24 DIAGNOSIS — Z87891 Personal history of nicotine dependence: Secondary | ICD-10-CM | POA: Diagnosis not present

## 2023-11-24 DIAGNOSIS — S43014A Anterior dislocation of right humerus, initial encounter: Secondary | ICD-10-CM | POA: Diagnosis not present

## 2023-11-24 DIAGNOSIS — S43034A Inferior dislocation of right humerus, initial encounter: Secondary | ICD-10-CM | POA: Diagnosis not present

## 2023-11-24 MED ORDER — PROPOFOL 10 MG/ML IV BOLUS
100.0000 mg | Freq: Once | INTRAVENOUS | Status: AC
  Administered 2023-11-24: 100 mg via INTRAVENOUS

## 2023-11-24 MED ORDER — BUPIVACAINE HCL 0.5 % IJ SOLN
20.0000 mL | Freq: Once | INTRAMUSCULAR | Status: AC
  Administered 2023-11-24: 20 mL
  Filled 2023-11-24: qty 1

## 2023-11-24 MED ORDER — MIDAZOLAM HCL 2 MG/2ML IJ SOLN
1.0000 mg | Freq: Once | INTRAMUSCULAR | Status: AC
  Administered 2023-11-24: 1 mg via INTRAVENOUS
  Filled 2023-11-24: qty 2

## 2023-11-24 MED ORDER — LACTATED RINGERS IV BOLUS
1000.0000 mL | Freq: Once | INTRAVENOUS | Status: AC
  Administered 2023-11-24: 1000 mL via INTRAVENOUS

## 2023-11-24 MED ORDER — FENTANYL CITRATE PF 50 MCG/ML IJ SOSY
100.0000 ug | PREFILLED_SYRINGE | Freq: Once | INTRAMUSCULAR | Status: AC
  Administered 2023-11-24: 100 ug via INTRAVENOUS
  Filled 2023-11-24: qty 2

## 2023-11-24 MED ORDER — PROPOFOL 10 MG/ML IV BOLUS
1.0000 mg/kg | Freq: Once | INTRAVENOUS | Status: DC
  Filled 2023-11-24: qty 20

## 2023-11-24 MED ORDER — PROPOFOL 10 MG/ML IV BOLUS
30.0000 mg | Freq: Once | INTRAVENOUS | Status: AC
  Administered 2023-11-24: 30 mg via INTRAVENOUS

## 2023-11-24 MED ORDER — ONDANSETRON HCL 4 MG/2ML IJ SOLN
4.0000 mg | Freq: Once | INTRAMUSCULAR | Status: AC
  Administered 2023-11-24: 4 mg via INTRAVENOUS
  Filled 2023-11-24: qty 2

## 2023-11-24 NOTE — Discharge Instructions (Addendum)
   [facility] Discharge Instructions   General Discharge Instructions  WEIGHT BEARING STATUS: non-weightbearing right arm  RANGE OF MOTION/ACTIVITY: Stay in sling at all times  DVT/PE prophylaxis: None  Diet: as you were eating previously.  Can use over the counter stool softeners and bowel preparations, such as Miralax, to help with bowel movements.  Narcotics can be constipating.  Be sure to drink plenty of fluids  PAIN MEDICATION USE AND EXPECTATIONS  You have likely been given narcotic medications to help control your pain.  After a traumatic event that results in an fracture (broken bone) with or without surgery, it is ok to use narcotic pain medications to help control one's pain.  We understand that everyone responds to pain differently and each individual patient will be evaluated on a regular basis for the continued need for narcotic medications. Ideally, narcotic medication use should last no more than 6-8 weeks (coinciding with fracture healing).   As a patient it is your responsibility as well to monitor narcotic medication use and report the amount and frequency you use these medications when you come to your office visit.   We would also advise that if you are using narcotic medications, you should take a dose prior to therapy to maximize you participation.  IF YOU ARE ON NARCOTIC MEDICATIONS IT IS NOT PERMISSIBLE TO OPERATE A MOTOR VEHICLE (MOTORCYCLE/CAR/TRUCK/MOPED) OR HEAVY MACHINERY DO NOT MIX NARCOTICS WITH OTHER CNS (CENTRAL NERVOUS SYSTEM) DEPRESSANTS SUCH AS ALCOHOL   STOP SMOKING OR USING NICOTINE PRODUCTS!!!!  As discussed nicotine severely impairs your body's ability to heal surgical and traumatic wounds but also impairs bone healing.  Wounds and bone heal by forming microscopic blood vessels (angiogenesis) and nicotine is a vasoconstrictor (essentially, shrinks blood vessels).  Therefore, if vasoconstriction occurs to these microscopic blood vessels  they essentially disappear and are unable to deliver necessary nutrients to the healing tissue.  This is one modifiable factor that you can do to dramatically increase your chances of healing your injury.    (This means no smoking, no nicotine gum, patches, etc)   ICE INJURED/OPERATIVE EXTREMITY  Using ice can help with swelling and pain control.  Icing in a pulsatile fashion, such as 20 minutes on and 20 minutes off, can be followed.    Do not place ice directly on skin. Make sure there is a barrier between to skin and the ice pack.    Using frozen items such as frozen peas works well as the conform nicely to the are that needs to be iced.   CALL THE OFFICE WITH ANY QUESTIONS OR CONCERNS: [phone]   VISIT OUR WEBSITE FOR ADDITIONAL INFORMATION: [email or web address]

## 2023-11-24 NOTE — ED Provider Notes (Signed)
 [place] EMERGENCY DEPARTMENT AT [facility] Provider Note   CSN: [id number] Arrival date & time: 11/24/23  1831     History  Chief Complaint  Patient presents with   Lynn Bond is a 58 y.o. female.  Patient with history of hypertension, obesity presents today with complaints of fall.  She states that same occurred immediately prior to arrival today when she was outside working in the yard and tripped and fell on her right shoulder.  She is endorsing pain to same.  She did not hit her head or lose consciousness.  She is not on a blood thinner.  She was able to get up and walk around afterwards without significant discomfort other than in her shoulder.  She denies any other injuries or complaints.  The history is provided by the patient. No language interpreter was used.  Fall      Home Medications Prior to Admission medications   Medication Sig Start Date End Date Taking? Authorizing Provider  albuterol (VENTOLIN HFA) 108 (90 Base) MCG/ACT inhaler INHALE 2 PUFFS BY MOUTH EVERY 6 HOURS AS NEEDED FOR WHEEZING FOR SHORTNESS OF BREATH 10/05/23   Monna Fam, MD  amLODipine (NORVASC) 10 MG tablet Take 1 tablet (10 mg total) by mouth daily. 07/30/23 04/25/24  Nooruddin, Jason Fila, MD  EPINEPHrine 0.3 mg/0.3 mL IJ SOAJ injection Inject 0.3 mg into the muscle as needed for anaphylaxis. 08/14/23   Linwood Dibbles, MD  famotidine (PEPCID) 20 MG tablet Take 1 tablet (20 mg total) by mouth 2 (two) times daily. 08/14/23   Linwood Dibbles, MD  fexofenadine (ALLEGRA) 180 MG tablet Take 1 tablet (180 mg total) by mouth daily. Patient not taking: Reported on 04/14/2023 12/19/22   Quincy Simmonds, MD  fluticasone [facility]) 50 MCG/ACT nasal spray Place 2 sprays into both nostrils daily. Patient taking differently: Place 2 sprays into both nostrils daily as needed for allergies or rhinitis. 12/19/22 03/19/23  Quincy Simmonds, MD  hydrochlorothiazide (HYDRODIURIL) 25 MG tablet Take 1 tablet (25  mg total) by mouth daily. for high blood pressure 07/30/23   Nooruddin, Jason Fila, MD  predniSONE (DELTASONE) 50 MG tablet Take 1 tablet (50 mg total) by mouth daily. 08/14/23   Linwood Dibbles, MD  Respiratory Therapy Supplies (NEBULIZER MASK ADULT) MISC 1 each by Does not apply route daily as needed. 01/31/21   Doran Stabler, DO  Semaglutide,0.25 or 0.5MG /DOS, (OZEMPIC, 0.25 OR 0.5 MG/DOSE,) 2 MG/1.5ML SOPN Inject 0.25 mg into the skin once a week. 07/30/23   Nooruddin, Jason Fila, MD  topiramate (TOPAMAX) 50 MG tablet Take 1 tablet (50 mg total) by mouth 2 (two) times daily. 03/12/23 12/07/23  Rayburn, Fanny Bien, PA-C      Allergies    Benadryl [diphenhydramine hcl] and Peanut-containing drug products    Review of Systems   Review of Systems  Musculoskeletal:  Positive for arthralgias.  All other systems reviewed and are negative.   Physical Exam Updated Vital Signs BP (!) 149/104   Pulse 80   Temp 97.9 F (36.6 C)   Resp 20   LMP 11/01/2019   SpO2 100%  Physical Exam Vitals and nursing note reviewed.  Constitutional:      General: She is not in acute distress.    Appearance: Normal appearance. She is normal weight. She is not ill-appearing, toxic-appearing or diaphoretic.  HENT:     Head: Normocephalic and atraumatic.     Comments: No racoon eyes No battle sign Eyes:  Extraocular Movements: Extraocular movements intact.     Pupils: Pupils are equal, round, and reactive to light.  Cardiovascular:     Rate and Rhythm: Normal rate.     Comments: No tenderness to palpation of the anterior chest wall Pulmonary:     Effort: Pulmonary effort is normal. No respiratory distress.  Abdominal:     Comments: No abdominal tenderness or bruising  Musculoskeletal:        General: Normal range of motion.     Cervical back: Normal and normal range of motion.     Thoracic back: Normal.     Lumbar back: Normal.     Comments: No midline tenderness, no stepoffs or deformity noted on palpation of  cervical, thoracic, and lumbar spine  Right anterior shoulder deformity.  No overlying wounds.  Radial and ulnar pulses intact and 2+.  ROM intact to the right elbow and wrist without issue.  Distal sensation intact as well.  Skin:    General: Skin is warm and dry.  Neurological:     General: No focal deficit present.     Mental Status: She is alert and oriented to person, place, and time.  Psychiatric:        Mood and Affect: Mood normal.        Behavior: Behavior normal.    ED Results / Procedures / Treatments   Labs (all labs ordered are listed, but only abnormal results are displayed) Labs Reviewed - No data to display  EKG None  Radiology DG Shoulder Right Result Date: 11/24/2023 CLINICAL DATA:  Pain after injury EXAM: RIGHT SHOULDER - 3 VIEW COMPARISON:  None Available. FINDINGS: There is dislocation of the humeral head which is displaced anterior and inferior to the bony glenoid. Mild joint space loss with osteophyte formation along the AC joint. No fracture. Preserved bone mineralization. Recommend follow up imaging after relocation to assess for other additional injury. IMPRESSION: Anterior inferior shoulder dislocation. Electronically Signed   By: Karen Kays M.D.   On: 11/24/2023 19:31    Procedures .Reduction of dislocation  Date/Time: 11/24/2023 10:28 PM  Performed by: Lynn Bandy, PA-C Authorized by: Lynn Bandy, PA-C  Consent: Verbal consent obtained. Risks and benefits: risks, benefits and alternatives were discussed Consent given by: patient Patient understanding: patient states understanding of the procedure being performed Imaging studies: imaging studies available Required items: required blood products, implants, devices, and special equipment available Patient identity confirmed: verbally with patient Local anesthesia used: yes Anesthesia: local infiltration  Anesthesia: Local anesthesia used: yes Local Anesthetic: bupivacaine 0.5% without  epinephrine Anesthetic total: 10 mL  Sedation: Patient sedated: no  Comments: Attempted downward traction with external rotation, unsuccessfully reduction attempt. Procedure terminated due to patients pain      Medications Ordered in ED Medications  propofol (DIPRIVAN) 10 mg/mL bolus/IV push 130 mg (has no administration in time range)  ondansetron (ZOFRAN) injection 4 mg (has no administration in time range)  lactated ringers bolus 1,000 mL (has no administration in time range)  fentaNYL (SUBLIMAZE) injection 100 mcg (100 mcg Intravenous Given 11/24/23 2055)  bupivacaine (MARCAINE) 0.5 % (with pres) injection 20 mL (20 mLs Infiltration Given by Other 11/24/23 2056)  midazolam (VERSED) injection 1 mg (1 mg Intravenous Given 11/24/23 2140)  fentaNYL (SUBLIMAZE) injection 100 mcg (50 mcg Intravenous Given 11/24/23 2151)    ED Course/ Medical Decision Making/ A&P  Medical Decision Making Amount and/or Complexity of Data Reviewed Labs: ordered. Radiology: ordered.  Risk Prescription drug management. Decision regarding hospitalization.   Patient presents today with complaints of fall immediately prior to arrival today. She is afebrile, non-toxic appearing, and in no acute distress with reassuring vital signs. Physical exam reveals TTP throughout palpation of the right shoulder with obvious deformity. Good distal pulses and sensation. No other areas of pain or tenderness. X-ray imaging obtained which reveals   Anterior inferior shoulder dislocation  I have personally reviewed and interpreted this imaging and agree with radiology interpretation.  I have personally attempted bedside relocation x 2 with downward traction and exterior rotation followed by traction countertraction technique, both of which were unsuccessful.  Limitations partly due to body habitus and inadequate pain control despite 158 total mcgs of fentanyl and 1 mg of Versed.  Myself and my  attending Dr. Hart Rochester also attempted relocation a third time unsuccessfully.  Given this, plan for conscious sedation.  This is pending at shift change.  Care handoff to EDP Dr. Karene Fry at shift change.  Please see their note for continued evaluation and dispo.  This is a shared visit with supervising physician Dr. Karene Fry who has independently evaluated patient & provided guidance in evaluation/management/disposition, in agreement with care   Final Clinical Impression(s) / ED Diagnoses Final diagnoses:  Dislocation of right shoulder joint, initial encounter    Rx / DC Orders ED Discharge Orders     None         Vear Clock 11/26/23 2211    Ernie Avena, MD 11/28/23 1055

## 2023-11-24 NOTE — ED Notes (Signed)
 Patient maintained on ETCO2 monitor at 2L/min for procedural sedation. ETCO2 42, SpO2 100%. AMBU and suction at head of bed.

## 2023-11-24 NOTE — ED Provider Notes (Signed)
 Physical Exam  BP (!) 133/101   Pulse 78   Temp 98.1 F (36.7 C)   Resp 16   LMP 11/01/2019   SpO2 97%   Physical Exam  Procedures  .Sedation  Date/Time: 11/24/2023 11:39 PM  Performed by: Ernie Avena, MD Authorized by: Ernie Avena, MD   Consent:    Consent obtained:  Written   Consent given by:  Patient   Risks discussed:  Allergic reaction, dysrhythmia, inadequate sedation, nausea, vomiting, respiratory compromise necessitating ventilatory assistance and intubation, prolonged sedation necessitating reversal and prolonged hypoxia resulting in organ damage   Alternatives discussed:  Analgesia without sedation Universal protocol:    Immediately prior to procedure, a time out was called: yes     Patient identity confirmed:  Anonymous protocol, patient vented/unresponsive, provided demographic data, arm band, verbally with patient and hospital-assigned identification number Pre-sedation assessment:    Time since last food or drink:  4 hours   NPO status caution: unable to specify NPO status     ASA classification: class 1 - normal, healthy patient     Mouth opening:  3 or more finger widths   Thyromental distance:  3 finger widths   Mallampati score:  II - soft palate, uvula, fauces visible   Pre-sedation assessments completed and reviewed: airway patency, cardiovascular function, hydration status, mental status, nausea/vomiting, pain level, respiratory function and temperature   A pre-sedation assessment was completed prior to the start of the procedure Immediate pre-procedure details:    Reassessment: Patient reassessed immediately prior to procedure     Reviewed: vital signs, relevant labs/tests and NPO status     Verified: bag valve mask available, emergency equipment available, intubation equipment available, IV patency confirmed, oxygen available and reversal medications available   Procedure details (see MAR for exact dosages):    Preoxygenation:  Nasal cannula    Sedation:  Propofol   Intended level of sedation: deep   Analgesia:  Fentanyl   Intra-procedure monitoring:  Blood pressure monitoring, cardiac monitor, continuous capnometry, continuous pulse oximetry, frequent LOC assessments and frequent vital sign checks   Intra-procedure events comment:  IV infiltration   Total Provider sedation time (minutes):  10 Post-procedure details:   A post-sedation assessment was completed following the completion of the procedure.   Post-sedation assessments completed and reviewed: airway patency, cardiovascular function, hydration status, mental status, nausea/vomiting, pain level and respiratory function   Comments:     Unsuccessful sedation, patient's IV infiltrated and an adequate sedation was obtained.  Seizure terminated. .Reduction of dislocation  Date/Time: 11/24/2023 11:40 PM  Performed by: Ernie Avena, MD Authorized by: Ernie Avena, MD  Consent: Written consent obtained. Consent given by: patient Required items: required blood products, implants, devices, and special equipment available Patient identity confirmed: verbally with patient and arm band Time out: Immediately prior to procedure a "time out" was called to verify the correct patient, procedure, equipment, support staff and site/side marked as required. Preparation: Patient was prepped and draped in the usual sterile fashion.  Sedation: Patient sedated: yes Sedation type: moderate (conscious) sedation Sedatives: propofol Analgesia: fentanyl Sedation start date/time: 11/24/2023 11:30 PM Sedation end date/time: 11/24/2023 11:40 PM Vitals: Vital signs were monitored during sedation.  Patient tolerance: patient tolerated the procedure well with no immediate complications     ED Course / MDM    Medical Decision Making Amount and/or Complexity of Data Reviewed Labs: ordered. Radiology: ordered.  Risk Prescription drug management. Decision regarding hospitalization.   I  assumed care from PA  Smoot of the patient.  Briefly, the patient experienced an anterior right shoulder dislocation after a fall.  Attempts at relocation were made x 2 prior to my assumption of care which were unsuccessful.  Some limitation due to patient body habitus.  The patient was consented for procedural sedation for reduction.  Reduction was attempted and was ultimately unsuccessful.  During the sedation, the patient's IV was noted to have infiltrated.  Given the third attempt here at reduction which was unsuccessful, I consulted orthopedics for reduction in the OR.  Orthopedics: I spoke with Dr. [name] who recommended hospitalist admission, N.p.o. at midnight, plan for reduction in the OR in the morning.  Hospitalist medicine consulted for admission and the patient was updated regarding the plan of care. Dr. Haroldine Laws accepted the patient in admission.     Ernie Avena, MD 11/25/23 Jorje Guild

## 2023-11-24 NOTE — ED Triage Notes (Signed)
 Trip and fall on to right arm   Pain in shoulder and down arm

## 2023-11-24 NOTE — ED Notes (Signed)
 RT at bedside throughout procedural sedation. No further respiratory interventions required. SpO2 maintained 100%, ETCO2 41.

## 2023-11-25 ENCOUNTER — Observation Stay (HOSPITAL_COMMUNITY): Payer: Medicaid Other | Admitting: Anesthesiology

## 2023-11-25 ENCOUNTER — Other Ambulatory Visit: Payer: Self-pay

## 2023-11-25 ENCOUNTER — Observation Stay (HOSPITAL_BASED_OUTPATIENT_CLINIC_OR_DEPARTMENT_OTHER): Payer: Medicaid Other | Admitting: Anesthesiology

## 2023-11-25 ENCOUNTER — Encounter (HOSPITAL_COMMUNITY)
Admission: EM | Disposition: A | Discharge status: Home / Self Care | Payer: Self-pay | Source: Home / Self Care | Attending: Emergency Medicine

## 2023-11-25 ENCOUNTER — Encounter (HOSPITAL_COMMUNITY): Payer: Self-pay | Admitting: Family Medicine

## 2023-11-25 ENCOUNTER — Observation Stay (HOSPITAL_COMMUNITY): Payer: Medicaid Other

## 2023-11-25 DIAGNOSIS — Z87891 Personal history of nicotine dependence: Secondary | ICD-10-CM | POA: Diagnosis not present

## 2023-11-25 DIAGNOSIS — S43004A Unspecified dislocation of right shoulder joint, initial encounter: Secondary | ICD-10-CM

## 2023-11-25 DIAGNOSIS — I10 Essential (primary) hypertension: Secondary | ICD-10-CM | POA: Diagnosis not present

## 2023-11-25 DIAGNOSIS — W010XXA Fall on same level from slipping, tripping and stumbling without subsequent striking against object, initial encounter: Secondary | ICD-10-CM | POA: Diagnosis not present

## 2023-11-25 DIAGNOSIS — Y93H2 Activity, gardening and landscaping: Secondary | ICD-10-CM | POA: Diagnosis not present

## 2023-11-25 DIAGNOSIS — J45909 Unspecified asthma, uncomplicated: Secondary | ICD-10-CM | POA: Diagnosis not present

## 2023-11-25 DIAGNOSIS — Z79899 Other long term (current) drug therapy: Secondary | ICD-10-CM | POA: Diagnosis not present

## 2023-11-25 DIAGNOSIS — Z96651 Presence of right artificial knee joint: Secondary | ICD-10-CM | POA: Diagnosis not present

## 2023-11-25 HISTORY — DX: Unspecified dislocation of right shoulder joint, initial encounter: S43.004A

## 2023-11-25 HISTORY — PX: SHOULDER CLOSED REDUCTION: SHX1051

## 2023-11-25 LAB — CBC WITH DIFFERENTIAL/PLATELET
Abs Immature Granulocytes: 0.03 10*3/uL (ref 0.00–0.07)
Basophils Absolute: 0 10*3/uL (ref 0.0–0.1)
Basophils Relative: 0 %
Eosinophils Absolute: 0.1 10*3/uL (ref 0.0–0.5)
Eosinophils Relative: 1 %
HCT: 36.3 % (ref 36.0–46.0)
Hemoglobin: 11.6 g/dL — ABNORMAL LOW (ref 12.0–15.0)
Immature Granulocytes: 0 %
Lymphocytes Relative: 7 %
Lymphs Abs: 0.8 10*3/uL (ref 0.7–4.0)
MCH: 26 pg (ref 26.0–34.0)
MCHC: 32 g/dL (ref 30.0–36.0)
MCV: 81.2 fL (ref 80.0–100.0)
Monocytes Absolute: 0.3 10*3/uL (ref 0.1–1.0)
Monocytes Relative: 3 %
Neutro Abs: 9.1 10*3/uL — ABNORMAL HIGH (ref 1.7–7.7)
Neutrophils Relative %: 89 %
Platelets: 292 10*3/uL (ref 150–400)
RBC: 4.47 MIL/uL (ref 3.87–5.11)
RDW: 15.9 % — ABNORMAL HIGH (ref 11.5–15.5)
WBC: 10.3 10*3/uL (ref 4.0–10.5)
nRBC: 0 % (ref 0.0–0.2)

## 2023-11-25 LAB — BASIC METABOLIC PANEL
Anion gap: 10 (ref 5–15)
Anion gap: 9 (ref 5–15)
BUN: 18 mg/dL (ref 6–20)
BUN: 12 mg/dL (ref 6–20)
CO2: 28 mmol/L (ref 22–32)
CO2: 28 mmol/L (ref 22–32)
Calcium: 9.8 mg/dL (ref 8.9–10.3)
Calcium: 9.2 mg/dL (ref 8.9–10.3)
Chloride: 97 mmol/L — ABNORMAL LOW (ref 98–111)
Chloride: 100 mmol/L (ref 98–111)
Creatinine, Ser: 0.83 mg/dL (ref 0.44–1.00)
Creatinine, Ser: 0.75 mg/dL (ref 0.44–1.00)
GFR, Estimated: >60 mL/min (ref >60)
GFR, Estimated: >60 mL/min (ref >60)
Glucose, Bld: 134 mg/dL — ABNORMAL HIGH (ref 70–99)
Glucose, Bld: 121 mg/dL — ABNORMAL HIGH (ref 70–99)
Potassium: 3.7 mmol/L (ref 3.5–5.1)
Potassium: 3.4 mmol/L — ABNORMAL LOW (ref 3.5–5.1)
Sodium: 135 mmol/L (ref 135–145)
Sodium: 137 mmol/L (ref 135–145)

## 2023-11-25 LAB — CBC
HCT: 35.9 % — ABNORMAL LOW (ref 36.0–46.0)
Hemoglobin: 11.7 g/dL — ABNORMAL LOW (ref 12.0–15.0)
MCH: 26.4 pg (ref 26.0–34.0)
MCHC: 32.6 g/dL (ref 30.0–36.0)
MCV: 80.9 fL (ref 80.0–100.0)
Platelets: 274 10*3/uL (ref 150–400)
RBC: 4.44 MIL/uL (ref 3.87–5.11)
RDW: 16 % — ABNORMAL HIGH (ref 11.5–15.5)
WBC: 9.5 10*3/uL (ref 4.0–10.5)
nRBC: 0 % (ref 0.0–0.2)

## 2023-11-25 LAB — MRSA NEXT GEN BY PCR, NASAL: MRSA by PCR Next Gen: NOT DETECTED

## 2023-11-25 SURGERY — CLOSED REDUCTION, SHOULDER
Anesthesia: General | Site: Shoulder | Laterality: Right

## 2023-11-25 MED ORDER — MEPERIDINE HCL 25 MG/ML IJ SOLN: 6.2500 mg | INTRAMUSCULAR | Status: DC | PRN

## 2023-11-25 MED ORDER — CYCLOBENZAPRINE HCL 10 MG PO TABS
10.0000 mg | ORAL_TABLET | Freq: Every day | ORAL | Status: DC
  Administered 2023-11-25 – 2023-11-26 (×2): 10 mg via ORAL
  Filled 2023-11-25 (×2): qty 1

## 2023-11-25 MED ORDER — GABAPENTIN 100 MG PO CAPS
100.0000 mg | ORAL_CAPSULE | Freq: Two times a day (BID) | ORAL | Status: DC
  Administered 2023-11-25 – 2023-11-26 (×3): 100 mg via ORAL
  Filled 2023-11-25 (×3): qty 1

## 2023-11-25 MED ORDER — OXYCODONE HCL 5 MG/5ML PO SOLN: 5.0000 mg | Freq: Once | ORAL | Status: DC | PRN

## 2023-11-25 MED ORDER — LIDOCAINE 2% (20 MG/ML) 5 ML SYRINGE
INTRAMUSCULAR | Status: AC
  Filled 2023-11-25: qty 5

## 2023-11-25 MED ORDER — ACETAMINOPHEN 650 MG RE SUPP: 650.0000 mg | Freq: Four times a day (QID) | RECTAL | Status: DC | PRN

## 2023-11-25 MED ORDER — PROPOFOL 10 MG/ML IV BOLUS
INTRAVENOUS | Status: AC
  Filled 2023-11-25: qty 20

## 2023-11-25 MED ORDER — AMLODIPINE BESYLATE 10 MG PO TABS
10.0000 mg | ORAL_TABLET | Freq: Every day | ORAL | Status: DC
  Administered 2023-11-25 – 2023-11-26 (×2): 10 mg via ORAL
  Filled 2023-11-25 (×2): qty 1

## 2023-11-25 MED ORDER — ACETAMINOPHEN 325 MG PO TABS: 650.0000 mg | ORAL_TABLET | Freq: Four times a day (QID) | ORAL | Status: DC | PRN

## 2023-11-25 MED ORDER — SODIUM CHLORIDE 0.9 % IV SOLN: 12.5000 mg | INTRAVENOUS | Status: DC | PRN

## 2023-11-25 MED ORDER — DEXAMETHASONE SODIUM PHOSPHATE 10 MG/ML IJ SOLN
INTRAMUSCULAR | Status: DC | PRN
  Administered 2023-11-25: 10 mg via INTRAVENOUS

## 2023-11-25 MED ORDER — HYDROMORPHONE HCL 1 MG/ML IJ SOLN
INTRAMUSCULAR | Status: AC
  Filled 2023-11-25: qty 1

## 2023-11-25 MED ORDER — ORAL CARE MOUTH RINSE: 15.0000 mL | Freq: Once | OROMUCOSAL | Status: AC

## 2023-11-25 MED ORDER — FAMOTIDINE 20 MG PO TABS
20.0000 mg | ORAL_TABLET | Freq: Two times a day (BID) | ORAL | Status: DC
  Administered 2023-11-25 – 2023-11-26 (×2): 20 mg via ORAL
  Filled 2023-11-25 (×2): qty 1

## 2023-11-25 MED ORDER — OXYCODONE HCL 10 MG PO TABS: 10.0000 mg | ORAL_TABLET | Freq: Two times a day (BID) | ORAL | Status: DC

## 2023-11-25 MED ORDER — HYDROMORPHONE HCL 1 MG/ML IJ SOLN
1.0000 mg | INTRAMUSCULAR | Status: DC | PRN
  Administered 2023-11-25 (×2): 1 mg via INTRAVENOUS
  Filled 2023-11-25 (×2): qty 1

## 2023-11-25 MED ORDER — ONDANSETRON HCL 4 MG/2ML IJ SOLN
4.0000 mg | Freq: Four times a day (QID) | INTRAMUSCULAR | Status: DC | PRN
  Administered 2023-11-25: 4 mg via INTRAVENOUS
  Filled 2023-11-25: qty 2

## 2023-11-25 MED ORDER — HYDRALAZINE HCL 20 MG/ML IJ SOLN: 10.0000 mg | Freq: Four times a day (QID) | INTRAMUSCULAR | Status: DC | PRN

## 2023-11-25 MED ORDER — MIDAZOLAM HCL 2 MG/2ML IJ SOLN
INTRAMUSCULAR | Status: DC | PRN
  Administered 2023-11-25: 2 mg via INTRAVENOUS

## 2023-11-25 MED ORDER — FENTANYL CITRATE (PF) 250 MCG/5ML IJ SOLN
INTRAMUSCULAR | Status: AC
  Filled 2023-11-25: qty 5

## 2023-11-25 MED ORDER — ALBUTEROL SULFATE (2.5 MG/3ML) 0.083% IN NEBU: 2.5000 mg | INHALATION_SOLUTION | Freq: Four times a day (QID) | RESPIRATORY_TRACT | Status: DC | PRN

## 2023-11-25 MED ORDER — LIDOCAINE 2% (20 MG/ML) 5 ML SYRINGE
INTRAMUSCULAR | Status: DC | PRN
  Administered 2023-11-25: 60 mg via INTRAVENOUS

## 2023-11-25 MED ORDER — CHLORHEXIDINE GLUCONATE 0.12 % MT SOLN
OROMUCOSAL | Status: AC
  Administered 2023-11-25: 15 mL via OROMUCOSAL
  Filled 2023-11-25: qty 15

## 2023-11-25 MED ORDER — HYDROMORPHONE HCL 1 MG/ML IJ SOLN
0.2500 mg | INTRAMUSCULAR | Status: DC | PRN
  Administered 2023-11-25: 0.5 mg via INTRAVENOUS

## 2023-11-25 MED ORDER — HYDROCHLOROTHIAZIDE 25 MG PO TABS
25.0000 mg | ORAL_TABLET | Freq: Every day | ORAL | Status: DC
  Administered 2023-11-25 – 2023-11-26 (×2): 25 mg via ORAL
  Filled 2023-11-25 (×2): qty 1

## 2023-11-25 MED ORDER — HYDROMORPHONE HCL 1 MG/ML IJ SOLN: 0.5000 mg | INTRAMUSCULAR | Status: DC | PRN

## 2023-11-25 MED ORDER — CHLORHEXIDINE GLUCONATE 0.12 % MT SOLN: 15.0000 mL | Freq: Once | OROMUCOSAL | Status: AC

## 2023-11-25 MED ORDER — PROPOFOL 10 MG/ML IV BOLUS
INTRAVENOUS | Status: DC | PRN
  Administered 2023-11-25: 200 mg via INTRAVENOUS

## 2023-11-25 MED ORDER — LACTATED RINGERS IV SOLN: INTRAVENOUS | Status: DC

## 2023-11-25 MED ORDER — AMISULPRIDE (ANTIEMETIC) 5 MG/2ML IV SOLN: 10.0000 mg | Freq: Once | INTRAVENOUS | Status: DC | PRN

## 2023-11-25 MED ORDER — OXYCODONE HCL 5 MG PO TABS: 5.0000 mg | ORAL_TABLET | Freq: Once | ORAL | Status: DC | PRN

## 2023-11-25 MED ORDER — DEXAMETHASONE SODIUM PHOSPHATE 10 MG/ML IJ SOLN
INTRAMUSCULAR | Status: AC
  Filled 2023-11-25: qty 1

## 2023-11-25 MED ORDER — ONDANSETRON HCL 4 MG/2ML IJ SOLN
INTRAMUSCULAR | Status: DC | PRN
  Administered 2023-11-25: 4 mg via INTRAVENOUS

## 2023-11-25 MED ORDER — FENTANYL CITRATE (PF) 250 MCG/5ML IJ SOLN
INTRAMUSCULAR | Status: DC | PRN
  Administered 2023-11-25: 100 ug via INTRAVENOUS

## 2023-11-25 MED ORDER — HYDROCODONE-ACETAMINOPHEN 5-325 MG PO TABS
1.0000 | ORAL_TABLET | Freq: Four times a day (QID) | ORAL | Status: DC | PRN
  Administered 2023-11-26: 1 via ORAL
  Filled 2023-11-25: qty 1

## 2023-11-25 MED ORDER — MIDAZOLAM HCL 2 MG/2ML IJ SOLN
INTRAMUSCULAR | Status: AC
Start: 2023-11-25 — End: ?
  Filled 2023-11-25: qty 2

## 2023-11-25 MED ORDER — ORAL CARE MOUTH RINSE: 15.0000 mL | OROMUCOSAL | Status: DC | PRN

## 2023-11-25 MED ORDER — HYDROMORPHONE HCL 1 MG/ML IJ SOLN
1.0000 mg | INTRAMUSCULAR | Status: DC | PRN
  Administered 2023-11-25: 1 mg via INTRAVENOUS
  Filled 2023-11-25: qty 1

## 2023-11-25 MED ORDER — ONDANSETRON HCL 4 MG/2ML IJ SOLN
INTRAMUSCULAR | Status: AC
  Filled 2023-11-25: qty 2

## 2023-11-25 SURGICAL SUPPLY — 1 items: SLING ARM FOAM STRAP XLG (SOFTGOODS) IMPLANT

## 2023-11-25 NOTE — Progress Notes (Signed)
(  Carryover admission to the Day Admitter; accepted by Dr.  Joneen Roach as transfer from  [facility]  to a  med-surg bed at  [facility]  for acute right shoulder dislocation, refractory to multiple attempts at reduction [facility]. Please see Dr.  Lajoyce Lauber transfer documentation in [facility] for additional details).   The patient has existing orders for prn Dilaudid, prn Zofran.   I have placed some additional preliminary admit orders via the adult multi-morbid admission order set. I have also ordered NPO as well as morning labs in the form of BMP, CBC.     Lynn Pigg, DO Hospitalist
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
## 2023-11-25 NOTE — Op Note (Signed)
 Orthopaedic Surgery Operative Note (CSN: [id number] ) Date of Surgery: 11/25/2023  Admit Date: 11/24/2023   Diagnoses: Pre-Op Diagnoses: Right shoulder dislocation  Post-Op Diagnosis: Same  Procedures: CPT 23655-Closed reduction of right shoulder dislocation  Surgeons : Primary: Eldean Klatt, [name], MD  Assistant: Thyra Breed, PA-C  Location: OR 3   Anesthesia:General   Antibiotics: Not indicated  Tourniquet time: None    Estimated Blood Loss: None  Complications:* No complications entered in OR log *   Specimens:* No specimens in log *   Implants: * No implants in log *   Indications for Surgery: 58 year old female who fell sustaining a right shoulder dislocation.  Reduction attempts were performed in the emergency room x 3 and they were unsuccessful.  Due to the unsuccessful times I recommend proceeding to the operating room for closed reduction versus open reduction.  Risks and benefits were discussed with the patient.  Risks included but not limited to bleeding, infection, persistent instability, fracture, nerve and blood vessel injury, and the possible anesthetic complications.  She agreed to proceed with surgery and consent was obtained.  Operative Findings: Successful closed reduction of right shoulder without complication.  Procedure: The patient was identified in the preoperative holding area. Consent was confirmed with the patient and their family and all questions were answered. The operative extremity was marked after confirmation with the patient. she was then brought back to the operating room by our anesthesia colleagues.  She was carefully transferred over to radiolucent flattop table.  She was placed under general anesthetic.  Timeout was performed to verify the patient, the procedure, and the extremity.  No preoperative antibiotics were needed due to the closed nature of her injury.  Fluoroscopic imaging showed the shoulder dislocation.  Gentle traction and  countertraction was provided by my assistant.  Ennever able to feel and hear an audible clunk of the shoulder back into position.  Fluoroscopic imaging showed anatomic reduction and an axillary view showed that the humeral head was concentric within the glenoid.  The patient was placed in the sling.  She was awoken from anesthesia and taken to the PACU in stable condition.  Post Op Plan/Instructions: Patient be nonweightbearing to the right upper extremity.  She will remain in her sling with follow-up in 1 to 2 weeks for x-rays and she may discharge postoperatively.  I was present and performed the entire surgery.  Thyra Breed, PA-C did assist me throughout the case. An assistant was necessary given the difficulty in reduction   Truitt Merle, MD [facility]

## 2023-11-25 NOTE — Transfer of Care (Signed)
 Immediate Anesthesia Transfer of Care Note  Patient: Lynn Bond  Procedure(s) Performed: CLOSED REDUCTION SHOULDER (Right: Shoulder)  Patient Location: PACU  Anesthesia Type:General  Level of Consciousness: awake, alert , and oriented  Airway & Oxygen Therapy: Patient Spontanous Breathing  Post-op Assessment: Report given to RN and Post -op Vital signs reviewed and stable  Post vital signs: Reviewed and stable  Last Vitals:  Vitals Value Taken Time  BP 151/97 11/25/23 1100  Temp    Pulse 76 11/25/23 1102  Resp 9 11/25/23 1102  SpO2 97 % 11/25/23 1102  Vitals shown include unfiled device data.  Last Pain:  Vitals:   11/25/23 0850  TempSrc: Oral  PainSc: 3          Complications: No notable events documented.

## 2023-11-25 NOTE — Anesthesia Procedure Notes (Signed)
 Procedure Name: LMA Insertion Date/Time: 11/25/2023 10:37 AM  Performed by: Sudie Grumbling, CRNAPre-anesthesia Checklist: Patient identified, Emergency Drugs available, Suction available and Patient being monitored Patient Re-evaluated:Patient Re-evaluated prior to induction Oxygen Delivery Method: Circle system utilized Preoxygenation: Pre-oxygenation with 100% oxygen Induction Type: IV induction LMA: LMA inserted LMA Size: 4.0 Number of attempts: 1 Tube secured with: Tape

## 2023-11-25 NOTE — Progress Notes (Signed)
 Per the transporter Ethelene Browns C., the pt will need 10 minutes to finish getting ready for her surgery.

## 2023-11-25 NOTE — Interval H&P Note (Signed)
 History and Physical Interval Note:  11/25/2023 9:57 AM  Lynn Bond  has presented today for surgery, with the diagnosis of Right shoulder dislocation.  The various methods of treatment have been discussed with the patient and family. After consideration of risks, benefits and other options for treatment, the patient has consented to  Procedure(s): CLOSED REDUCTION SHOULDER VS POSSIBLE OPEN REDUCTION (Right) as a surgical intervention.  The patient's history has been reviewed, patient examined, no change in status, stable for surgery.  I have reviewed the patient's chart and labs.  Questions were answered to the patient's satisfaction.     Caryn Bee P Kayson Tasker
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
## 2023-11-25 NOTE — Anesthesia Postprocedure Evaluation (Signed)
 Anesthesia Post Note  Patient: Lynn Bond  Procedure(s) Performed: CLOSED REDUCTION SHOULDER (Right: Shoulder)     Patient location during evaluation: PACU Anesthesia Type: General Level of consciousness: awake and alert Pain management: pain level controlled Vital Signs Assessment: post-procedure vital signs reviewed and stable Respiratory status: spontaneous breathing, nonlabored ventilation and respiratory function stable Cardiovascular status: blood pressure returned to baseline and stable Postop Assessment: no apparent nausea or vomiting Anesthetic complications: no   No notable events documented.  Last Vitals:  Vitals:   11/25/23 1200 11/25/23 1224  BP: (!) 150/101 (!) 131/94  Pulse: 69 73  Resp: 12 16  Temp: 37 C 36.8 C  SpO2: 94% 94%    Last Pain:  Vitals:   11/25/23 1224  TempSrc: Oral  PainSc:                  Lowella Curb

## 2023-11-25 NOTE — Anesthesia Preprocedure Evaluation (Signed)
 Anesthesia Evaluation  Patient identified by MRN, date of birth, ID band Patient awake    Reviewed: Allergy & Precautions, NPO status , Patient's Chart, lab work & pertinent test results  Airway Mallampati: I  TM Distance: >3 FB Neck ROM: Full    Dental  (+) Edentulous Upper, Edentulous Lower   Pulmonary asthma , former smoker   breath sounds clear to auscultation       Cardiovascular hypertension, Pt. on medications  Rhythm:Regular Rate:Normal     Neuro/Psych negative neurological ROS  negative psych ROS   GI/Hepatic Neg liver ROS,GERD  ,,  Endo/Other    Class 3 obesity  Renal/GU negative Renal ROS     Musculoskeletal  (+) Arthritis , Osteoarthritis,    Abdominal  (+) + obese  Peds  Hematology negative hematology ROS (+)   Anesthesia Other Findings   Reproductive/Obstetrics                             Anesthesia Physical Anesthesia Plan  ASA: 3  Anesthesia Plan: General   Post-op Pain Management:    Induction: Intravenous  PONV Risk Score and Plan: 3 and Ondansetron, Dexamethasone, Midazolam and Treatment may vary due to age or medical condition  Airway Management Planned: LMA  Additional Equipment: None  Intra-op Plan:   Post-operative Plan: Extubation in OR  Informed Consent: I have reviewed the patients History and Physical, chart, labs and discussed the procedure including the risks, benefits and alternatives for the proposed anesthesia with the patient or authorized representative who has indicated his/her understanding and acceptance.       Plan Discussed with: CRNA  Anesthesia Plan Comments:        Anesthesia Quick Evaluation

## 2023-11-25 NOTE — Plan of Care (Signed)

## 2023-11-25 NOTE — Progress Notes (Signed)
 Nurse received from [facility] ED. Pt alert and oriented x 4. Significant other arrived in separate vehicle. Pt oriented to room and call system. Charge RN paged admitting team. Pt is npo at this time for possible procedure. No concerns at this time

## 2023-11-25 NOTE — Progress Notes (Signed)
 Paged Admission consult thru Amion for pt's attending and admission order.

## 2023-11-26 ENCOUNTER — Encounter (HOSPITAL_COMMUNITY): Payer: Self-pay | Admitting: Student

## 2023-11-26 DIAGNOSIS — S43004A Unspecified dislocation of right shoulder joint, initial encounter: Secondary | ICD-10-CM | POA: Diagnosis not present

## 2023-11-26 DIAGNOSIS — Z79899 Other long term (current) drug therapy: Secondary | ICD-10-CM | POA: Diagnosis not present

## 2023-11-26 MED ORDER — HYDROCODONE-ACETAMINOPHEN 5-325 MG PO TABS: 1.0000 | ORAL_TABLET | Freq: Four times a day (QID) | ORAL | 0 refills | Status: DC | PRN

## 2023-11-26 NOTE — Discharge Summary (Signed)
 Physician Discharge Summary  Lynn Bond UEA:[id number] DOB: [birth year] DOA: 11/24/2023  PCP: Lynn Messier, MD  Admit date: 11/24/2023  Discharge date: 11/26/2023  Admitted From: Home  Disposition:  Home  Recommendations for Outpatient Follow-up:  Follow up with PCP in 1-2 weeks. Please obtain BMP/CBC in one week. Advised to follow-up with orthopedics Dr. [name] in 2 weeks. Advised right upper extremity nonweightbearing at all times,  always remains in sling until f/u with ortho.  Home Health:None Equipment/Devices:None  Discharge Condition: Stable CODE STATUS:Full code Diet recommendation: Heart Healthy   Brief [facility] Course: This 58 yrs old female with PMH significant for hypertension, asthma, obesity with BMI of 48. She is currently on short-term disability at her work due to recent knee surgery and is preparing to return in May.  Patient tripped over an object at home and injured her right shoulder. She presented in the ED and found to have right shoulder dislocation.  ED physician attempted 3 times to reduce closely but was unsuccessful.  Orthopedics recommended admitting patient to [facility] for operative reduction.  Patient underwent successful operative right shoulder reduction.  Tolerated well.  She continued to remain in pain, continued on adequate pain control.  Orthopedics signed off recommended right upper extremity should be nonweightbearing all-time always remain in sling until follow-up with orthopedics.  Patient wants to be discharged.  Patient being discharged home.  Outpatient follow-up appointment with orthopedics scheduled.    Discharge Diagnoses:  Principal Problem:   Shoulder dislocation, right, initial encounter    Discharge Instructions  Discharge Instructions     Call MD for:  difficulty breathing, headache or visual disturbances   Complete by: As directed    Call MD for:  persistant dizziness or light-headedness   Complete by: As  directed    Call MD for:  severe uncontrolled pain   Complete by: As directed    Diet - low sodium heart healthy   Complete by: As directed    Diet Carb Modified   Complete by: As directed    Discharge instructions   Complete by: As directed    Advised to follow-up with primary care physician in 1 week.  Advised to follow-up with orthopedics Dr. [name] in 2 weeks. Advised right upper extremity nonweightbearing at all times,  always remains in sling until f/u with ortho.   Increase activity slowly   Complete by: As directed       Allergies as of 11/26/2023       Reactions   Benadryl [diphenhydramine Hcl] Anaphylaxis, Hives, Swelling   Peanut-containing Drug Products Anaphylaxis, Swelling        Medication List     STOP taking these medications    Ozempic (0.25 or 0.5 MG/DOSE) 2 MG/1.5ML Sopn Generic drug: Semaglutide(0.25 or 0.5MG /DOS)       TAKE these medications    albuterol 108 (90 Base) MCG/ACT inhaler Commonly known as: VENTOLIN HFA INHALE 2 PUFFS BY MOUTH EVERY 6 HOURS AS NEEDED FOR WHEEZING FOR SHORTNESS OF BREATH   amLODipine 10 MG tablet Commonly known as: NORVASC Take 1 tablet (10 mg total) by mouth daily.   cyclobenzaprine 10 MG tablet Commonly known as: FLEXERIL Take 10 mg by mouth daily as needed for muscle spasms.   diclofenac 75 MG EC tablet Commonly known as: VOLTAREN Take 75 mg by mouth 2 (two) times daily as needed for moderate pain (pain score 4-6).   EPINEPHrine 0.3 mg/0.3 mL Soaj injection Commonly known as: EPI-PEN Inject 0.3 mg  into the muscle as needed for anaphylaxis.   famotidine 20 MG tablet Commonly known as: PEPCID Take 1 tablet (20 mg total) by mouth 2 (two) times daily. What changed:  when to take this reasons to take this   hydrochlorothiazide 25 MG tablet Commonly known as: HYDRODIURIL Take 1 tablet (25 mg total) by mouth daily. for high blood pressure   HYDROcodone-acetaminophen 5-325 MG tablet Commonly known as:  NORCO/VICODIN Take 1 tablet by mouth every 6 (six) hours as needed for moderate pain (pain score 4-6). What changed: when to take this        Follow-up Information     Bond, Lynn Manners, MD. Schedule an appointment as soon as possible for a visit.   Specialty: Orthopedic Surgery Why: 1-2 weeks for repeat x-rays right shoulder Contact information: [address] [place] [address] [phone]         Lynn Messier, MD Follow up in 1 week(s).   Specialty: Internal Medicine Contact information: [address] [place] [address] [phone]                Allergies  Allergen Reactions   Benadryl [Diphenhydramine Hcl] Anaphylaxis, Hives and Swelling   Peanut-Containing Drug Products Anaphylaxis and Swelling    Consultations: Orthopeadics   Procedures/Studies: DG Shoulder Right Result Date: 11/25/2023 CLINICAL DATA:  Elective surgery EXAM: Intraoperative fluoroscopy COMPARISON:  X-ray 11/24/2023 FINDINGS: Interval reduction of the previous anterior shoulder dislocation under fluoroscopy. Improved alignment. Imaging was obtained to aid in treatment. Please correlate with real-time fluoroscopy of 6 seconds. Cumulative dose 0.74 mGy. IMPRESSION: Intraoperative fluoroscopy Electronically Signed   By: Karen Kays M.D.   On: 11/25/2023 14:38   DG C-Arm 1-60 Min-No Report Result Date: 11/25/2023 Fluoroscopy was utilized by the requesting physician.  No radiographic interpretation.   DG Shoulder Right Result Date: 11/24/2023 CLINICAL DATA:  Pain after injury EXAM: RIGHT SHOULDER - 3 VIEW COMPARISON:  None Available. FINDINGS: There is dislocation of the humeral head which is displaced anterior and inferior to the bony glenoid. Mild joint space loss with osteophyte formation along the AC joint. No fracture. Preserved bone mineralization. Recommend follow up imaging after relocation to assess for other additional injury. IMPRESSION: Anterior inferior shoulder  dislocation. Electronically Signed   By: Karen Kays M.D.   On: 11/24/2023 19:31    Subjective: Patient was seen and examined at bedside.  Overnight events noted.   Patient reports feeling much improved,  Pain is reasonably controlled,  right arm is in sling.  Wants to be discharged.  Discharge Exam: Vitals:   11/26/23 0401 11/26/23 0909  BP: 114/73 133/87  Pulse: 66 66  Resp: 16 19  Temp: 98.2 F (36.8 C) 98.3 F (36.8 C)  SpO2: 97% 100%   Vitals:   11/25/23 1224 11/25/23 2037 11/26/23 0401 11/26/23 0909  BP: (!) 131/94 121/86 114/73 133/87  Pulse: 73 98 66 66  Resp: 16 18 16 19   Temp: 98.2 F (36.8 C) 98.7 F (37.1 C) 98.2 F (36.8 C) 98.3 F (36.8 C)  TempSrc: Oral  Oral Oral  SpO2: 94% 95% 97% 100%  Weight:      Height:        General: Pt is alert, awake, not in acute distress Cardiovascular: RRR, S1/S2 +, no rubs, no gallops Respiratory: CTA bilaterally, no wheezing, no rhonchi Abdominal: Soft, NT, ND, bowel sounds + Extremities: no edema, no cyanosis    The results of significant diagnostics from this hospitalization (including imaging,  microbiology, ancillary and laboratory) are listed below for reference.     Microbiology: Recent Results (from the past 240 hours)  MRSA Next Gen by PCR, Nasal     Status: None   Collection Time: 11/25/23  2:33 AM   Specimen: Nasal Mucosa; Nasal Swab  Result Value Ref Range Status   MRSA by PCR Next Gen NOT DETECTED NOT DETECTED Final    Comment: (NOTE) The GeneXpert MRSA Assay (FDA approved for NASAL specimens only), is one component of a comprehensive MRSA colonization surveillance program. It is not intended to diagnose MRSA infection nor to guide or monitor treatment for MRSA infections. Test performance is not FDA approved in patients less than 57 years old. Performed at [facility], [address] [place], [place] [address]      Labs: BNP (last 3 results) No results for input(s): "BNP" in the last  8760 hours. Basic Metabolic Panel: Recent Labs  Lab 11/25/23 0055 11/25/23 0611  NA 135 137  K 3.7 3.4*  CL 97* 100  CO2 28 28  GLUCOSE 134* 121*  BUN 18 12  CREATININE 0.83 0.75  CALCIUM 9.8 9.2   Liver Function Tests: No results for input(s): "AST", "ALT", "ALKPHOS", "BILITOT", "PROT", "ALBUMIN" in the last 168 hours. No results for input(s): "LIPASE", "AMYLASE" in the last 168 hours. No results for input(s): "AMMONIA" in the last 168 hours. CBC: Recent Labs  Lab 11/25/23 0055 11/25/23 0611  WBC 10.3 9.5  NEUTROABS 9.1*  --   HGB 11.6* 11.7*  HCT 36.3 35.9*  MCV 81.2 80.9  PLT 292 274   Cardiac Enzymes: No results for input(s): "CKTOTAL", "CKMB", "CKMBINDEX", "TROPONINI" in the last 168 hours. BNP: Invalid input(s): "POCBNP" CBG: No results for input(s): "GLUCAP" in the last 168 hours. D-Dimer No results for input(s): "DDIMER" in the last 72 hours. Hgb A1c No results for input(s): "HGBA1C" in the last 72 hours. Lipid Profile No results for input(s): "CHOL", "HDL", "LDLCALC", "TRIG", "CHOLHDL", "LDLDIRECT" in the last 72 hours. Thyroid function studies No results for input(s): "TSH", "T4TOTAL", "T3FREE", "THYROIDAB" in the last 72 hours.  Invalid input(s): "FREET3" Anemia work up No results for input(s): "VITAMINB12", "FOLATE", "FERRITIN", "TIBC", "IRON", "RETICCTPCT" in the last 72 hours. Urinalysis    Component Value Date/Time   COLORURINE YELLOW 05/27/2023 1843   APPEARANCEUR CLEAR 05/27/2023 1843   LABSPEC 1.018 05/27/2023 1843   PHURINE 7.5 05/27/2023 1843   GLUCOSEU NEGATIVE 05/27/2023 1843   HGBUR NEGATIVE 05/27/2023 1843   BILIRUBINUR NEGATIVE 05/27/2023 1843   KETONESUR NEGATIVE 05/27/2023 1843   PROTEINUR NEGATIVE 05/27/2023 1843   UROBILINOGEN 0.2 08/08/2007 0958   NITRITE NEGATIVE 05/27/2023 1843   LEUKOCYTESUR NEGATIVE 05/27/2023 1843   Sepsis Labs Recent Labs  Lab 11/25/23 0055 11/25/23 0611  WBC 10.3 9.5   Microbiology Recent  Results (from the past 240 hours)  MRSA Next Gen by PCR, Nasal     Status: None   Collection Time: 11/25/23  2:33 AM   Specimen: Nasal Mucosa; Nasal Swab  Result Value Ref Range Status   MRSA by PCR Next Gen NOT DETECTED NOT DETECTED Final    Comment: (NOTE) The GeneXpert MRSA Assay (FDA approved for NASAL specimens only), is one component of a comprehensive MRSA colonization surveillance program. It is not intended to diagnose MRSA infection nor to guide or monitor treatment for MRSA infections. Test performance is not FDA approved in patients less than 43 years old. Performed at [facility], [address], [place], [place] [address]      Time coordinating discharge: Over 30 minutes  SIGNED:   Willeen Niece, MD  [facility] 11/26/2023, 12:16 PM Pager   If 7PM-7AM, please contact night-coverage

## 2023-11-26 NOTE — Progress Notes (Signed)
 Orthopaedic Trauma Progress Note  SUBJECTIVE: Doing okay this morning.  Notes some aching in her right shoulder.  Denies any numbness or tingling throughout the right upper extremity. No chest pain. No SOB. No nausea/vomiting. No other complaints.  Tolerating diet and fluids.  OBJECTIVE:  Vitals:   11/25/23 2037 11/26/23 0401  BP: 121/86 114/73  Pulse: 98 66  Resp: 18 16  Temp: 98.7 F (37.1 C) 98.2 F (36.8 C)  SpO2: 95% 97%    General: Sitting up on edge of bed, no acute distress.  Pleasant and cooperative Respiratory: No increased work of breathing.  Right upper extremity: Sling in place, adjusted for patient's comfort.  No significant swelling or bruising noted throughout the extremity.  Mildly tender over the shoulder with palpation but otherwise no significant tenderness throughout the remainder of the arm.  Did not attempt any shoulder motion.  Tolerates gentle elbow motion in the sling.  Wrist flexion/extension intact.  Motor and sensory function to the median, ulnar, radial nerve distributions intact.  2+ radial pulse  IMAGING: Intraoperative x-rays confirm successful reduction of the humeral head into the glenohumeral joint  LABS: No results found for this or any previous visit (from the past 24 hours).  ASSESSMENT: Lynn Bond is a 58 y.o. female, 1 Day Post-Op s/p CLOSED REDUCTION RIGHT SHOULDER  CV/Blood loss: Hemoglobin 11.7 this morning, stable over the last 24 hours. Hemodynamically stable  PLAN: Weightbearing: NWB RUE ROM: Maintain sling.  No shoulder motion Showering: Okay to shower Orthopedic device(s): Shoulder sling RUE at all times Pain management:  1. Tylenol 650 mg q 6 hours PRN 2.  Flexeril 10 mg daily 3.  Neurontin 100 mg twice daily  4.  Norco 5-325 mg q 6 hours PRN 5. Dilaudid 0.5-1 mg q 3 hours PRN VTE prophylaxis:  SCDs ID: No postop antibiotics needed Foley/Lines:  No foley, KVO IVFs Dispo: Patient will maintain shoulder sling until  follow-up.  Okay for discharge from ortho standpoint once cleared by medicine team and therapies  D/C recommendations: -Norco 5-325mg  for pain control -No chemical DVT prophylaxis needed  Follow - up plan:  1 to 2 weeks for repeat x-rays right shoulder   Contact information:  Truitt Merle MD, Thyra Breed PA-C. After hours and holidays please check [email or web address] for group call information for [facility]   Thompson Caul, PA-C [phone] (office) [email or web address]

## 2023-11-26 NOTE — Progress Notes (Signed)
 Transition of Care [facility]) - CAGE-AID Screening   Patient Details  Name: Lynn Bond MRN: [id number] Date of Birth: [birth year]  Transition of Care [facility]) CM/SW Contact:    Leota Sauers, RN Phone Number: 11/26/2023, 6:02 AM   Clinical Narrative:  Patient denies the use of alcohol and illicit substances. Resources not given at this time.   CAGE-AID Screening:    Have You Ever Felt You Ought to Cut Down on Your Drinking or Drug Use?: No Have People Annoyed You By Critizing Your Drinking Or Drug Use?: No Have You Felt Bad Or Guilty About Your Drinking Or Drug Use?: No Have You Ever Had a Drink or Used Drugs First Thing In The Morning to Steady Your Nerves or to Get Rid of a Hangover?: No CAGE-AID Score: 0  Substance Abuse Education Offered: No

## 2023-11-26 NOTE — Evaluation (Signed)
 Physical Therapy Brief Evaluation and Discharge Note Patient Details Name: Lynn Bond MRN: [id number] DOB: 07-03-1966 Today's Date: 11/26/2023   History of Present Illness  Pt is 58 yo presenting to [facility] ED for closed reduction of R shoulder dislocation on 2/26 after a ground level fall. PMH: achilles tendinitis LLE, arthritis, ashtma, Eczema, GERD, HTN, pre-diabetes  Clinical Impression  Pt is presenting close to baseline level of functioning. Currently pt RUE is in a sling and is NWB. Pt is Mod I to ind for bed mobility, sit to stand and gait without an AD. Pt was encouraged to use SPC at home due to she was not using [facility] when she had her fall. Pt spouse is present and supportive during session. Currently pt is presenting at baseline level of functioning and no skilled physical therapy services recommended. Pt will be discharged from skilled physical therapy services at this time; please re-consult if further needs arise.           PT Assessment Patient does not need any further PT services  Assistance Needed at Discharge  PRN    Equipment Recommendations None recommended by PT     Precautions/Restrictions Precautions Precautions: Fall Recall of Precautions/Restrictions: Intact Restrictions Weight Bearing Restrictions Per Provider Order: Yes RUE Weight Bearing Per Provider Order: Non weight bearing        Mobility  Bed Mobility Rolling: Independent Supine/Sidelying to sit: Modified independent (Device/Increased time)      Transfers Overall transfer level: Independent Equipment used: None    Ambulation/Gait Ambulation/Gait assistance: Modified independent (Device/Increase time) Gait Distance (Feet): 250 Feet Assistive device: None Gait Pattern/deviations: Step-through pattern, Trendelenburg Gait Speed: Pace WFL    Home Activity Instructions Home Activity Instructions: use SPC at home in L hand    Balance Overall balance assessment: Modified  Independent        Pertinent Vitals/Pain   Pain Assessment Pain Assessment: 0-10 Pain Score: 0-No pain     Home Living Family/patient expects to be discharged to:: Private residence Living Arrangements: Spouse/significant other Available Help at Discharge: Family Home Environment: Level entry   Home Equipment: Cane - single point        Prior Function Level of Independence: Independent with assistive device(s) Comments: has SPC But does not use it. Pt reports this is her only fall. Denies other falls. Had knee surgery 9 months ago.    UE/LE Assessment   UE ROM/Strength/Tone/Coordination: Impaired UE ROM/Strength/Tone/Coordination Deficits: RUE limited due to recent shoulder reduction  LE ROM/Strength/Tone/Coordination: [facility]      Communication   Communication Communication: No apparent difficulties     Cognition Overall Cognitive Status: Appears within functional limits for tasks assessed/performed       General Comments General comments (skin integrity, edema, etc.): No noted skin issues outside of clothes. Pt arm in good position in sling. Pt demonstrates no signs/symptoms of cardiac/respiratory distress with activity        Assessment/Plan           No Skilled PT Patient is modified independent with all activity/mobility;All education completed    AMPAC 6 Clicks Help needed turning from your back to your side while in a flat bed without using bedrails?: None Help needed moving from lying on your back to sitting on the side of a flat bed without using bedrails?: None Help needed moving to and from a bed to a chair (including a wheelchair)?: None Help needed standing up from a chair using your arms (e.g., wheelchair or bedside  chair)?: None Help needed to walk in hospital room?: None Help needed climbing 3-5 steps with a railing? : None 6 Click Score: 24      End of Session   Activity Tolerance: Patient tolerated treatment well Patient left: in  bed;with call bell/phone within reach;with family/visitor present Nurse Communication: Mobility status       Time: 6578-4696 PT Time Calculation (min) (ACUTE ONLY): 10 min  Charges:   PT Evaluation $PT Eval Low Complexity: 1 Low      Harrel Carina, DPT, CLT  Acute Rehabilitation Services Office: [phone] (Secure chat preferred)   Claudia Desanctis  11/26/2023, 10:46 AM

## 2023-11-30 DIAGNOSIS — Z09 Encounter for follow-up examination after completed treatment for conditions other than malignant neoplasm: Secondary | ICD-10-CM | POA: Diagnosis not present

## 2023-11-30 DIAGNOSIS — M25562 Pain in left knee: Secondary | ICD-10-CM | POA: Diagnosis not present

## 2023-11-30 DIAGNOSIS — Z6841 Body Mass Index (BMI) 40.0 and over, adult: Secondary | ICD-10-CM | POA: Diagnosis not present

## 2023-11-30 DIAGNOSIS — Z96651 Presence of right artificial knee joint: Secondary | ICD-10-CM | POA: Diagnosis not present

## 2023-11-30 DIAGNOSIS — M25561 Pain in right knee: Secondary | ICD-10-CM | POA: Diagnosis not present

## 2023-11-30 DIAGNOSIS — Z79899 Other long term (current) drug therapy: Secondary | ICD-10-CM | POA: Diagnosis not present

## 2023-11-30 DIAGNOSIS — M1712 Unilateral primary osteoarthritis, left knee: Secondary | ICD-10-CM | POA: Diagnosis not present

## 2023-12-07 ENCOUNTER — Telehealth: Payer: Self-pay | Admitting: Student

## 2023-12-07 NOTE — Telephone Encounter (Signed)
 Copied from CRM [id number]. Topic: Clinical - Request for Lab/Test Order >> Dec 07, 2023 10:25 AM Tiffany H wrote: Reason for CRM: Patient called to request orders for next round of post-op Xrays. Please assist and follow up with patient so that she can schedule her appointment.  Rec'd message from CRM incorrectly.  This is Internal Medicine. This patient is needing speak with the Ortho/Surg Office.  Provided the following info to the pt to f/u with imaging:   [facility] surgeon in [place], [place] Address: [address] [place], [place], [place] [address] Phone: [phone]

## 2023-12-09 NOTE — ED Notes (Signed)
 ALDRETE SCORE prior to Departure:  Respiration - 2 O2 Sat - 2 Consciousness - 2 Circulation - 2 Activity - 2  10 Total

## 2023-12-10 ENCOUNTER — Encounter: Payer: Self-pay | Admitting: Student

## 2023-12-10 ENCOUNTER — Ambulatory Visit: Payer: Self-pay | Admitting: Student

## 2023-12-10 ENCOUNTER — Other Ambulatory Visit: Payer: Self-pay

## 2023-12-10 VITALS — BP 119/70 | HR 80 | Temp 98.4°F | Ht 67.0 in | Wt 285.7 lb

## 2023-12-10 DIAGNOSIS — S43004A Unspecified dislocation of right shoulder joint, initial encounter: Secondary | ICD-10-CM

## 2023-12-10 DIAGNOSIS — J45909 Unspecified asthma, uncomplicated: Secondary | ICD-10-CM

## 2023-12-10 DIAGNOSIS — Z23 Encounter for immunization: Secondary | ICD-10-CM

## 2023-12-10 DIAGNOSIS — J4530 Mild persistent asthma, uncomplicated: Secondary | ICD-10-CM

## 2023-12-10 MED ORDER — ALBUTEROL SULFATE (2.5 MG/3ML) 0.083% IN NEBU: 2.5000 mg | INHALATION_SOLUTION | RESPIRATORY_TRACT | 2 refills | Status: DC | PRN

## 2023-12-10 NOTE — Progress Notes (Signed)
 CC: Hospitalization follow up  HPI:  [name] is a 58 y.o. female living with a history stated below and presents today for follow up from her hospitalization. Please see problem based assessment and plan for additional details.  Past Medical History:  Diagnosis Date   Achilles tendinitis of left lower extremity 05/11/2019   Arthritis    Asthma    ECZEMA 03/09/2009   Qualifier: Diagnosis of  By: Andrey Campanile  MD, Valerie     Eczema 02/13/2015   GERD 03/09/2009   Qualifier: Diagnosis of  By: Andrey Campanile  MD, Valerie     Hypertension    Obesity, unspecified 10/11/2008   Qualifier: Diagnosis of   By: Andrey Campanile  MD, Frutoso Schatz     Current Outpatient Medications on File Prior to Visit  Medication Sig Dispense Refill   albuterol (VENTOLIN HFA) 108 (90 Base) MCG/ACT inhaler INHALE 2 PUFFS BY MOUTH EVERY 6 HOURS AS NEEDED FOR WHEEZING FOR SHORTNESS OF BREATH 9 g 0   amLODipine (NORVASC) 10 MG tablet Take 1 tablet (10 mg total) by mouth daily. 90 tablet 2   cyclobenzaprine (FLEXERIL) 10 MG tablet Take 10 mg by mouth daily as needed for muscle spasms.     diclofenac (VOLTAREN) 75 MG EC tablet Take 75 mg by mouth 2 (two) times daily as needed for moderate pain (pain score 4-6).     EPINEPHrine 0.3 mg/0.3 mL IJ SOAJ injection Inject 0.3 mg into the muscle as needed for anaphylaxis. 1 each 0   famotidine (PEPCID) 20 MG tablet Take 1 tablet (20 mg total) by mouth 2 (two) times daily. (Patient taking differently: Take 20 mg by mouth 2 (two) times daily as needed for heartburn or indigestion.) 10 tablet 0   hydrochlorothiazide (HYDRODIURIL) 25 MG tablet Take 1 tablet (25 mg total) by mouth daily. for high blood pressure 90 tablet 2   HYDROcodone-acetaminophen (NORCO/VICODIN) 5-325 MG tablet Take 1 tablet by mouth every 6 (six) hours as needed for moderate pain (pain score 4-6). 28 tablet 0   No current facility-administered medications on file prior to visit.    Family  History  Problem Relation Age of Onset   Hypertension Mother    Cancer Father    Colon polyps Brother    Colon cancer Neg Hx    Esophageal cancer Neg Hx    Prostate cancer Neg Hx    Rectal cancer Neg Hx     Social History   Socioeconomic History   Marital status: Married    Spouse name: Not on file   Number of children: Not on file   Years of education: Not on file   Highest education level: Not on file  Occupational History   Not on file  Tobacco Use   Smoking status: Former    Current packs/day: 0.00    Types: Cigarettes    Quit date: 10/20/1988    Years since quitting: 35.1   Smokeless tobacco: Never   Tobacco comments:    quit 34yrs ago  Vaping Use   Vaping status: Never Used  Substance and Sexual Activity   Alcohol use: No    Alcohol/week: 0.0 standard drinks of alcohol   Drug use: No   Sexual activity: Yes  Other Topics Concern   Not on file  Social History Narrative   Works at [employer]    Social Drivers of Health   Financial Resource Strain: Low Risk  (11/05/2022)  Overall Financial Resource Strain (CARDIA)    Difficulty of Paying Living Expenses: Not hard at all  Food Insecurity: No Food Insecurity (11/25/2023)   Hunger Vital Sign    Worried About Running Out of Food in the Last Year: Never true    Ran Out of Food in the Last Year: Never true  Transportation Needs: No Transportation Needs (11/25/2023)   PRAPARE - Administrator, Civil Service (Medical): No    Lack of Transportation (Non-Medical): No  Physical Activity: Inactive (11/05/2022)   Exercise Vital Sign    Days of Exercise per Week: 0 days    Minutes of Exercise per Session: 0 min  Stress: No Stress Concern Present (11/05/2022)   [employer] of Occupational Health - Occupational Stress Questionnaire    Feeling of Stress : Not at all  Social Connections: Moderately Integrated (11/25/2023)   Social Connection and Isolation Panel [NHANES]    Frequency of Communication  with Friends and Family: More than three times a week    Frequency of Social Gatherings with Friends and Family: More than three times a week    Attends Religious Services: More than 4 times per year    Active Member of [employer] or Organizations: No    Attends Banker Meetings: Never    Marital Status: Married  Catering manager Violence: Not At Risk (11/25/2023)   Humiliation, Afraid, Rape, and Kick questionnaire    Fear of Current or Ex-Partner: No    Emotionally Abused: No    Physically Abused: No    Sexually Abused: No    Review of Systems: ROS negative except for what is noted on the assessment and plan.  Vitals:   12/10/23 1535  BP: 119/70  Pulse: 80  Temp: 98.4 F (36.9 C)  TempSrc: Oral  SpO2: 100%  Weight: 285 lb 11.2 oz (129.6 kg)  Height: 5\' 7"  (1.702 m)    Physical Exam: Constitutional: well-appearing female  in no acute distress Cardiovascular: regular rate and rhythm, no m/r/g Pulmonary/Chest: normal work of breathing on room air, lungs clear to auscultation bilaterally Abdominal: soft, non-tender, non-distended MSK: normal bulk and tone, right arm in sling, good strength, no apparent abnormalities Neurological: alert & oriented x 3, 5/5 strength in bilateral upper extremities   Assessment & Plan:   Shoulder dislocation, right, initial encounter Patient presents for follow-up regarding her shoulder dislocation.  On February 25 that she was cleaning her yard, did not see a hole accidentally stepped into it and fell landed on her shoulder.  She was admitted to the hospital and underwent a closed reduction of her shoulder successfully.  Unfortunately, she had lost her discharge paperwork, and did not know who to call to follow-up regarding her shoulder.  Provided her with phone number for orthopedic office.  She states that she has repeat x-rays on Monday.  On my exam she still has good strength of the shoulder, and she is not taking any pain medications  for it.  Plan: - Patient will follow-up with orthopedics on Monday  Asthma Refilled her albuterol nebulizer as she states that with spring coming around her allergies get much worse with the increase in pollen.  Patient discussed with Dr. Kae Heller Bohden Dung, M.D. [facility] Internal Medicine, PGY-2 Pager: [phone] Date 12/10/2023 Time 4:27 PM

## 2023-12-10 NOTE — Assessment & Plan Note (Signed)
 Refilled her albuterol nebulizer as she states that with spring coming around her allergies get much worse with the increase in pollen.

## 2023-12-10 NOTE — Patient Instructions (Signed)
 Thank you so much for coming to the clinic today!   Please make an appointment with your orthopedic doctor for follow-up.  Your blood pressure is looking great, keep up the good work.  We will see you back in about 4 months for your annual physical.  If you have any questions please feel free to the call the clinic at anytime at [phone]. It was a pleasure seeing you!  Best, Dr. Thomasene Ripple

## 2023-12-10 NOTE — Assessment & Plan Note (Signed)
 Patient presents for follow-up regarding her shoulder dislocation.  On February 25 that she was cleaning her yard, did not see a hole accidentally stepped into it and fell landed on her shoulder.  She was admitted to the hospital and underwent a closed reduction of her shoulder successfully.  Unfortunately, she had lost her discharge paperwork, and did not know who to call to follow-up regarding her shoulder.  Provided her with phone number for orthopedic office.  She states that she has repeat x-rays on Monday.  On my exam she still has good strength of the shoulder, and she is not taking any pain medications for it.  Plan: - Patient will follow-up with orthopedics on Monday

## 2023-12-15 DIAGNOSIS — S43004D Unspecified dislocation of right shoulder joint, subsequent encounter: Secondary | ICD-10-CM | POA: Diagnosis not present

## 2023-12-17 NOTE — Progress Notes (Signed)
 Internal Medicine Clinic Attending  Case discussed with the resident at the time of the visit.  We reviewed the resident's history and exam and pertinent patient test results.  I agree with the assessment, diagnosis, and plan of care documented in the resident's note.   As a correction to the note; this is a subsequent encounter for the dislocated shoulder.

## 2023-12-29 DIAGNOSIS — Z96651 Presence of right artificial knee joint: Secondary | ICD-10-CM | POA: Diagnosis not present

## 2023-12-29 DIAGNOSIS — M25562 Pain in left knee: Secondary | ICD-10-CM | POA: Diagnosis not present

## 2023-12-29 DIAGNOSIS — Z6841 Body Mass Index (BMI) 40.0 and over, adult: Secondary | ICD-10-CM | POA: Diagnosis not present

## 2023-12-29 DIAGNOSIS — M25561 Pain in right knee: Secondary | ICD-10-CM | POA: Diagnosis not present

## 2023-12-29 DIAGNOSIS — M1712 Unilateral primary osteoarthritis, left knee: Secondary | ICD-10-CM | POA: Diagnosis not present

## 2023-12-29 DIAGNOSIS — Z79899 Other long term (current) drug therapy: Secondary | ICD-10-CM | POA: Diagnosis not present

## 2023-12-31 ENCOUNTER — Other Ambulatory Visit: Payer: Self-pay | Admitting: Student

## 2023-12-31 DIAGNOSIS — Z79899 Other long term (current) drug therapy: Secondary | ICD-10-CM | POA: Diagnosis not present

## 2023-12-31 MED ORDER — ALBUTEROL SULFATE (2.5 MG/3ML) 0.083% IN NEBU: 2.5000 mg | INHALATION_SOLUTION | RESPIRATORY_TRACT | 2 refills | Status: DC | PRN

## 2023-12-31 MED ORDER — ALBUTEROL SULFATE HFA 108 (90 BASE) MCG/ACT IN AERS: 2.0000 | INHALATION_SPRAY | Freq: Four times a day (QID) | RESPIRATORY_TRACT | 0 refills | Status: DC | PRN

## 2023-12-31 NOTE — Telephone Encounter (Signed)
 Copied from CRM [id number]. Topic: Clinical - Medication Refill >> Dec 31, 2023  8:32 AM Alfred Levins wrote: Most Recent Primary Care Visit:  Provider: [name]  Department: [facility]  Visit Type: HOSPITAL FOLLOW UP  Date: 12/10/2023 fluticasone (FLONASE) 50 MCG/ACT nasal spray Medication:   Has the patient contacted their pharmacy? No (Agent: If no, request that the patient contact the pharmacy for the refill. If patient does not wish to contact the pharmacy document the reason why and proceed with request.) (Agent: If yes, when and what did the pharmacy advise?)  Is this the correct pharmacy for this prescription? Yes If no, delete pharmacy and type the correct one.  This is the patient's preferred pharmacy:  [id number] - [place] ([place]), [place] - [address] [place] ([place]) [place] [address] Phone: [phone] Fax: [phone]      Is the patient out of the medication? Yes  Has the patient been seen for an appointment in the last year OR does the patient have an upcoming appointment? Yes  Can we respond through MyChart? Yes  Agent: Please be advised that Rx refills may take up to 3 business days. We ask that you follow-up with your pharmacy.

## 2023-12-31 NOTE — Telephone Encounter (Signed)
 Copied from CRM [id number]. Topic: Clinical - Medication Refill >> Dec 31, 2023  8:17 AM Alfred Levins wrote: Most Recent Primary Care Visit:  Provider: [name]  Department: [facility]  Visit Type: HOSPITAL FOLLOW UP  Date: 12/10/2023  Medication:  albuterol (PROVENTIL) (2.5 MG/3ML) 0.083% nebulizer solution albuterol (VENTOLIN HFA) 108 (90 Base) MCG/ACT inhaler    Has the patient contacted their pharmacy? Yes (Agent: If no, request that the patient contact the pharmacy for the refill. If patient does not wish to contact the pharmacy document the reason why and proceed with request.) (Agent: If yes, when and what did the pharmacy advise?)  Is this the correct pharmacy for this prescription? Yes If no, delete pharmacy and type the correct one.  This is the patient's preferred pharmacy:   [id number] - [place] ([place]), [place] - [address] [place] ([place]) [place] [address] Phone: [phone] Fax: [phone]    Is the patient out of the medication? Yes  Has the patient been seen for an appointment in the last year OR does the patient have an upcoming appointment? Yes  Can we respond through MyChart? Yes  Agent: Please be advised that Rx refills may take up to 3 business days. We ask that you follow-up with your pharmacy.

## 2024-01-04 ENCOUNTER — Ambulatory Visit: Payer: Self-pay

## 2024-01-04 NOTE — Telephone Encounter (Signed)
 Patient is calling to check on her prescription. Per patient everything else was sent to the pharmacy except fluticasone (FLONASE) 50 MCG/ACT nasal spray . Please advise.

## 2024-01-04 NOTE — Telephone Encounter (Signed)
 Patient calling to check on refill request for Flonase nasal spray submitted on 04/03. Patient states the reason it is not active on her medication list is due to the fact that she gets it called in for her once a year and she makes that one prescription last the entire year, only when there is allergy flare ups due to pollen. Patient states currently she is experiencing nasal congestion due to pollen and would like this medication called in to the pharmacy on her profile. Please advise.   Copied from CRM [id number]. Topic: Clinical - Prescription Issue >> Jan 04, 2024  3:44 PM Adrianna P wrote: Reason for CRM: Patient still has not got nose spray prescription as requested Reason for Disposition  [1] Caller requesting NON-URGENT health information AND [2] PCP's office is the best resource  Answer Assessment - Initial Assessment Questions 1. REASON FOR CALL or QUESTION: "What is your reason for calling today?" or "How can I best help you?" or "What question do you have that I can help answer?"     See notes  Protocols used: Information Only Call - No Triage-A-AH

## 2024-01-05 DIAGNOSIS — S43004D Unspecified dislocation of right shoulder joint, subsequent encounter: Secondary | ICD-10-CM | POA: Diagnosis not present

## 2024-01-10 MED ORDER — FLUTICASONE PROPIONATE 50 MCG/ACT NA SUSP: 1.0000 | Freq: Every day | NASAL | 2 refills | Status: AC

## 2024-01-12 ENCOUNTER — Ambulatory Visit: Payer: Self-pay | Admitting: Student

## 2024-01-12 ENCOUNTER — Other Ambulatory Visit (HOSPITAL_COMMUNITY)
Admission: RE | Admit: 2024-01-12 | Discharge: 2024-01-12 | Disposition: A | Discharge status: Home / Self Care | Source: Ambulatory Visit | Attending: Internal Medicine | Admitting: Internal Medicine

## 2024-01-12 VITALS — BP 107/67 | HR 82 | Temp 98.4°F

## 2024-01-12 DIAGNOSIS — Z124 Encounter for screening for malignant neoplasm of cervix: Secondary | ICD-10-CM | POA: Insufficient documentation

## 2024-01-12 DIAGNOSIS — J309 Allergic rhinitis, unspecified: Secondary | ICD-10-CM

## 2024-01-12 DIAGNOSIS — J301 Allergic rhinitis due to pollen: Secondary | ICD-10-CM

## 2024-01-12 HISTORY — DX: Encounter for screening for malignant neoplasm of cervix: Z12.4

## 2024-01-12 NOTE — Patient Instructions (Signed)
 Thank you, Ms.Lander Pines for allowing us  to provide your care today. Today we discussed:  Continue your medications as prescribed.   I have ordered the following labs for you:  Lab Orders  No laboratory test(s) ordered today     Tests ordered today:  Pap smear   Referrals ordered today:   Referral Orders  No referral(s) requested today     I have ordered the following medication/changed the following medications:   Stop the following medications: There are no discontinued medications.   Start the following medications: No orders of the defined types were placed in this encounter.    Follow up: As needed for annual physical    Remember:   Should you have any questions or concerns please call the internal medicine clinic at [phone].     Lanney Pitts, DO [facility]

## 2024-01-12 NOTE — Assessment & Plan Note (Signed)
 Reports seasonal allergies and would like refills on Flonase. Patient is notified that refills are already sent to the pharmacy and she can pick it up.

## 2024-01-12 NOTE — Progress Notes (Signed)
   Established Patient Office Visit  Subjective   Patient ID: Lynn Bond, female    DOB: 1966/04/03  Age: 58 y.o. MRN: [id number]  No chief complaint on file.   HPI This is a 58 year old female living with a history stated below and presents today for Pap smear. Please see problem based assessment and plan for additional details.    Past Medical History:  Diagnosis Date   Achilles tendinitis of left lower extremity 05/11/2019   Arthritis    Asthma    ECZEMA 03/09/2009   Qualifier: Diagnosis of  By: Elvan Hamel  MD, Valerie     Eczema 02/13/2015   GERD 03/09/2009   Qualifier: Diagnosis of  By: Elvan Hamel  MD, Valerie     Hypertension    Obesity, unspecified 10/11/2008   Qualifier: Diagnosis of   By: Elvan Hamel  MD, Valerie         Pre-diabetes    ROS   As per assessment and plan  Objective:     BP 107/67 (BP Location: Right Arm, Patient Position: Sitting, Cuff Size: Large)   Pulse 82   Temp 98.4 F (36.9 C) (Oral)   LMP 11/01/2019   SpO2 100%  BP Readings from Last 3 Encounters:  01/12/24 107/67  12/10/23 119/70  11/26/23 133/87   Wt Readings from Last 3 Encounters:  12/10/23 285 lb 11.2 oz (129.6 kg)  11/25/23 288 lb 8 oz (130.9 kg)  08/08/23 251 lb (113.9 kg)   SpO2 Readings from Last 3 Encounters:  01/12/24 100%  12/10/23 100%  11/26/23 100%      Physical Exam  General: Sitting in chair, no acute distress Cardiovascular: Regular rate Pulmonary: Breathing comfortably Abdomen: Soft, nontender MSK: Range of motion intact  No results found for any visits on 01/12/24.   The 10-year ASCVD risk score (Arnett DK, et al., 2019) is: 2.2%    Assessment & Plan:  Patient is discussed with Dr Jarvis Mesa   Problem List Items Addressed This Visit       Respiratory   Allergic rhinitis   Reports seasonal allergies and would like refills on Flonase. Patient is notified that refills are already sent to the pharmacy and she can pick it up.         Other    Encounter for Papanicolaou smear for cervical cancer screening - Primary   Patient reports that she does not remember the last time she had a Pap smear, states it was several years ago.  Denies any family history of cervical or breast cancers.  States that she would like to get her Pap smear done today.  Does not have any concerns of any STIs.  Reports she is postmenopausal, presently not on HRT.      Relevant Orders   Cytology -Pap Smear    Return in about 6 months (around 07/13/2024) for As needed for anuual physical .    Shauni Henner, DO

## 2024-01-12 NOTE — Assessment & Plan Note (Addendum)
 Patient reports that she does not remember the last time she had a Pap smear, states it was several years ago.  Denies any family history of cervical or breast cancers.  States that she would like to get her Pap smear done today.  Does not have any concerns of any STIs.  Reports she is postmenopausal, presently not on HRT.

## 2024-01-13 NOTE — Progress Notes (Signed)
Internal Medicine Clinic Attending  I was physically present during the key portions of the resident provided service and participated in the medical decision making of patient's management care. I reviewed pertinent patient test results.  The assessment, diagnosis, and plan were formulated together and I agree with the documentation in the resident's note.  Mercie Eon, MD

## 2024-01-15 LAB — CYTOLOGY - PAP
Adequacy: ABSENT
Comment: NEGATIVE
Diagnosis: NEGATIVE
High risk HPV: NEGATIVE

## 2024-01-28 DIAGNOSIS — M25561 Pain in right knee: Secondary | ICD-10-CM | POA: Diagnosis not present

## 2024-01-28 DIAGNOSIS — I10 Essential (primary) hypertension: Secondary | ICD-10-CM | POA: Diagnosis not present

## 2024-01-28 DIAGNOSIS — Z96651 Presence of right artificial knee joint: Secondary | ICD-10-CM | POA: Diagnosis not present

## 2024-01-28 DIAGNOSIS — M1712 Unilateral primary osteoarthritis, left knee: Secondary | ICD-10-CM | POA: Diagnosis not present

## 2024-01-28 DIAGNOSIS — Z79899 Other long term (current) drug therapy: Secondary | ICD-10-CM | POA: Diagnosis not present

## 2024-01-28 DIAGNOSIS — M25562 Pain in left knee: Secondary | ICD-10-CM | POA: Diagnosis not present

## 2024-01-28 DIAGNOSIS — Z6841 Body Mass Index (BMI) 40.0 and over, adult: Secondary | ICD-10-CM | POA: Diagnosis not present

## 2024-02-02 DIAGNOSIS — Z79899 Other long term (current) drug therapy: Secondary | ICD-10-CM | POA: Diagnosis not present

## 2024-02-09 DIAGNOSIS — M1711 Unilateral primary osteoarthritis, right knee: Secondary | ICD-10-CM | POA: Diagnosis not present

## 2024-02-29 DIAGNOSIS — Z6841 Body Mass Index (BMI) 40.0 and over, adult: Secondary | ICD-10-CM | POA: Diagnosis not present

## 2024-03-05 ENCOUNTER — Other Ambulatory Visit: Payer: Self-pay | Admitting: Internal Medicine

## 2024-03-07 NOTE — Telephone Encounter (Signed)
 Medication sent to pharmacy

## 2024-03-29 DIAGNOSIS — Z6841 Body Mass Index (BMI) 40.0 and over, adult: Secondary | ICD-10-CM | POA: Diagnosis not present

## 2024-03-31 DIAGNOSIS — M1711 Unilateral primary osteoarthritis, right knee: Secondary | ICD-10-CM | POA: Diagnosis not present

## 2024-04-06 ENCOUNTER — Telehealth: Payer: Self-pay

## 2024-04-06 NOTE — Progress Notes (Signed)
 Complex Care Management Note Care Guide Note  04/06/2024 Name: Lynn Bond MRN: [id number] DOB: 01-15-1966   Complex Care Management Outreach Attempts: An unsuccessful telephone outreach was attempted today to offer the patient information about available complex care management services.  Follow Up Plan:  Additional outreach attempts will be made to offer the patient complex care management information and services.   Encounter Outcome:  No Answer  [name]  [facility], [facility] Care Management Assistant Direct Dial: [phone]  Fax: [phone]

## 2024-04-06 NOTE — Progress Notes (Signed)
 Complex Care Management Note  Care Guide Note 04/06/2024 Name: Lynn Bond MRN: [id number] DOB: 07-23-1966  Lynn Bond is a 58 y.o. year old female who sees Nooruddin, Saad, MD for primary care. I reached out to Richardson Delores Birmingham by phone today to offer complex care management services.  [name] was given information about Complex Care Management services today including:   The Complex Care Management services include support from the care team which includes your Nurse Care Manager, Clinical Social Worker, or Pharmacist.  The Complex Care Management team is here to help remove barriers to the health concerns and goals most important to you. Complex Care Management services are voluntary, and the patient may decline or stop services at any time by request to their care team member.   Complex Care Management Consent Status: Patient agreed to services and verbal consent obtained.   Follow up plan:  Telephone appointment with complex care management team member scheduled for:  04/15/24 at 10:00 a.m.  Encounter Outcome:  Patient Scheduled  [name]  [facility], [facility] Care Management Assistant Direct Dial: [phone]  Fax: [phone]

## 2024-04-13 ENCOUNTER — Encounter (HOSPITAL_COMMUNITY): Payer: Self-pay

## 2024-04-13 ENCOUNTER — Emergency Department (HOSPITAL_COMMUNITY)
Admission: EM | Admit: 2024-04-13 | Discharge: 2024-04-13 | Disposition: A | Discharge status: Home / Self Care | Attending: Emergency Medicine | Admitting: Emergency Medicine

## 2024-04-13 ENCOUNTER — Other Ambulatory Visit: Payer: Self-pay

## 2024-04-13 DIAGNOSIS — T7840XA Allergy, unspecified, initial encounter: Secondary | ICD-10-CM | POA: Diagnosis not present

## 2024-04-13 DIAGNOSIS — R202 Paresthesia of skin: Secondary | ICD-10-CM | POA: Diagnosis not present

## 2024-04-13 DIAGNOSIS — R070 Pain in throat: Secondary | ICD-10-CM | POA: Diagnosis not present

## 2024-04-13 DIAGNOSIS — Z91010 Allergy to peanuts: Secondary | ICD-10-CM | POA: Insufficient documentation

## 2024-04-13 DIAGNOSIS — Z79899 Other long term (current) drug therapy: Secondary | ICD-10-CM | POA: Insufficient documentation

## 2024-04-13 DIAGNOSIS — T781XXA Other adverse food reactions, not elsewhere classified, initial encounter: Secondary | ICD-10-CM | POA: Diagnosis not present

## 2024-04-13 DIAGNOSIS — R0989 Other specified symptoms and signs involving the circulatory and respiratory systems: Secondary | ICD-10-CM | POA: Diagnosis not present

## 2024-04-13 MED ORDER — EPINEPHRINE 0.3 MG/0.3ML IJ SOAJ: 0.3000 mg | INTRAMUSCULAR | 0 refills | Status: AC | PRN

## 2024-04-13 MED ORDER — EPINEPHRINE 0.3 MG/0.3ML IJ SOAJ
0.3000 mg | Freq: Once | INTRAMUSCULAR | Status: AC
  Administered 2024-04-13: 0.3 mg via INTRAMUSCULAR

## 2024-04-13 MED ORDER — METHYLPREDNISOLONE SODIUM SUCC 125 MG IJ SOLR
125.0000 mg | Freq: Once | INTRAMUSCULAR | Status: AC
  Administered 2024-04-13: 125 mg via INTRAVENOUS
  Filled 2024-04-13: qty 2

## 2024-04-13 MED ORDER — FAMOTIDINE IN NACL 20-0.9 MG/50ML-% IV SOLN
20.0000 mg | Freq: Once | INTRAVENOUS | Status: AC
  Administered 2024-04-13: 20 mg via INTRAVENOUS
  Filled 2024-04-13: qty 50

## 2024-04-13 MED ORDER — EPINEPHRINE 0.15 MG/0.3ML IJ SOAJ
INTRAMUSCULAR | Status: AC
Start: 2024-04-13 — End: 2024-04-13
  Filled 2024-04-13: qty 0.3

## 2024-04-13 MED ORDER — METHYLPREDNISOLONE 4 MG PO TBPK: ORAL_TABLET | ORAL | 0 refills | Status: DC

## 2024-04-13 NOTE — ED Notes (Signed)
 Pt denies studder at this time, denies itchiness in her throat, denies pain, offers no complaints.

## 2024-04-13 NOTE — ED Provider Notes (Signed)
  Physical Exam  BP 120/77   Pulse 89   Temp 98.8 F (37.1 C) (Oral)   Resp 15   Ht 5' 7 (1.702 m)   Wt 117.5 kg   LMP 11/01/2019   SpO2 100%   BMI 40.57 kg/m   Physical Exam  Procedures  Procedures  ED Course / MDM   Clinical Course as of 04/13/24 1538  Wed Apr 13, 2024  1519 Here with allergic reaction to peanuts, history of the same.  Received 2 shots of epinephrine .  Currently under observation.  May be discharged around 5:30 PM [AH]    Clinical Course User Index [AH] Philomina Leon, PA-C   Medical Decision Making Risk Prescription drug management.    Patient observed in the ER - no recurrence of sxs. Safe for dc with epipen  and steroid taper. Discussed return precautions       Arloa Chroman, PA-C 04/14/24 1403    Mannie Pac T, DO 04/15/24 2323

## 2024-04-13 NOTE — ED Notes (Signed)
 Pt in bed, resps even and unlabored, pt states that she had a smoothie and started having feelings of lip tinglling and throat swelling, states that she took her epi and was given epi again upon arrival. Pt states that she still has some itchiness in her throat, but it is better. Pt has a slight studder, PA at bedside, studder seems to have resolved, pt oriented, no facial droop or weakness noted

## 2024-04-13 NOTE — ED Triage Notes (Signed)
 Pt bib POV c/o allergic reaction to peanuts. Pt self administered Epi-pen 12:50.  Pt was drinking a smoothie from [facility] when her throat started feeling itchy and it was hard for her to get her words out.

## 2024-04-13 NOTE — ED Notes (Addendum)
 Pt. States I feel 100% better. Denies any feelings of itches in the throat, states that she is ready to go home.

## 2024-04-13 NOTE — Discharge Instructions (Signed)
 Contact a health care provider if: You have symptoms that do not go away within 2 days. You have symptoms that get worse. You have new symptoms. Get help right away if you have symptoms of anaphylaxis: Flushed skin. Hives. Swelling of the eyes, lips, face, mouth, tongue, or throat. Difficulty breathing, speaking, or swallowing. Wheezing. Dizziness, light-headedness or fainting. Pain or cramping in the abdomen. These symptoms may represent a serious problem that is an emergency. Do not wait to see if the symptoms will go away. Use your auto-injector pen or anaphylaxis kit as you have been told. Get medical help right away. Call your local emergency services (911 in the [place] Do not drive yourself to the hospital.

## 2024-04-13 NOTE — ED Provider Triage Note (Addendum)
 Emergency Medicine Provider Triage Evaluation Note  Lynn Bond , a 58 y.o. female  was evaluated in triage.  Pt complains of anaphylactic reaction to peanuts.  Patient having wheezing with throat swelling and itchy throat and trouble breathing.  She denies any other rash or itching.  Audible wheezing. Epi pen around 30-45 minutes ago  Review of Systems  Positive:  Negative:   Physical Exam  LMP 11/01/2019  Gen:   Awake, no distress   Resp:  Tachypnea, audible wheezing, upper airway noises.  Controlling secretions. MSK:   Moves extremities without difficulty  Other:    Medical Decision Making  Medically screening exam initiated at 1:28 PM.  Appropriate orders placed.  [name] was informed that the remainder of the evaluation will be completed by another provider, this initial triage assessment does not replace that evaluation, and the importance of remaining in the ED until their evaluation is complete.  Patient was brought back to triage 3 immediately and was there upon arrival. Struggling hisotry given patients mdical condition. She reports she had had a smoothie from speaking that contained peanuts and she is anaphylactically allergic.  She reports that she gave herself an EpiPen  around 30 to 45 minutes prior.  Has not felt any better and arrived to the ER.  She is anaphylactic allergic to Benadryl.  Second epi was shortly administered afterwards.  Charge nurse aware the patient needs to be roomed immediately and will be in triage for obersvation until  1:38 PM patient is feeling better.  Speech appears to be returning to normal.  Patient is being room to 18.   Bernis Ernst, PA-C 04/13/24 1331    Bernis Ernst, [place] 04/13/24 1338

## 2024-04-13 NOTE — ED Provider Notes (Signed)
 [place] EMERGENCY DEPARTMENT AT [facility] Provider Note   CSN: [id number] Arrival date & time: 04/13/24  1321     Patient presents with: Allergic Reaction   Lynn Bond is a 58 y.o. female.   The history is provided by the patient and medical records. No language interpreter was used.  Allergic Reaction    59 year old female with history of peanut allergies presenting with concerns of allergic reaction.  Patient report about an hour ago she went to a smoothie shop to get a smoothie he and she was getting a raw smoothie then have coconut in it.  After she took the smoothie she felt her tongue tingling sensation and some tightness in her throat.  She then self administered an EpiPen  approximately an hour and 10 minutes ago.  Once arrived to the ER she received another EpiPen .  As she felt the throat tightness seems to be improving however she has noticed that she is having trouble forming her speech and stuttering which is new.  This causes great consternation.  She does not endorse any lightheadedness or dizziness denies any skin itchiness no abdominal cramping and no wheezing.  She denies feeling bad before drinking her smoothie.  She denies any prior history of stroke.  She denies feeling upset prior to this event.  Prior to Admission medications   Medication Sig Start Date End Date Taking? Authorizing Provider  albuterol  (PROVENTIL ) (2.5 MG/3ML) 0.083% nebulizer solution Take 3 mLs (2.5 mg total) by nebulization every 4 (four) hours as needed for wheezing or shortness of breath. 12/31/23 12/30/24  Addie Perkins, DO  amLODipine  (NORVASC ) 10 MG tablet Take 1 tablet (10 mg total) by mouth daily. 07/30/23 04/25/24  Nooruddin, Saad, MD  cyclobenzaprine  (FLEXERIL ) 10 MG tablet Take 10 mg by mouth daily as needed for muscle spasms. 09/11/23   [provider]  diclofenac  (VOLTAREN ) 75 MG EC tablet Take 75 mg by mouth 2 (two) times daily as needed for moderate pain (pain  score 4-6). 09/18/23   [provider]  EPINEPHrine  0.3 mg/0.3 mL IJ SOAJ injection Inject 0.3 mg into the muscle as needed for anaphylaxis. 08/14/23   Randol Simmonds, MD  famotidine  (PEPCID ) 20 MG tablet Take 1 tablet (20 mg total) by mouth 2 (two) times daily. Patient taking differently: Take 20 mg by mouth 2 (two) times daily as needed for heartburn or indigestion. 08/14/23   Randol Simmonds, MD  fluticasone  (FLONASE ) 50 MCG/ACT nasal spray Place 1 spray into both nostrils daily. 01/10/24 01/09/25  Rosan Dayton BROCKS, DO  hydrochlorothiazide  (HYDRODIURIL ) 25 MG tablet Take 1 tablet (25 mg total) by mouth daily. for high blood pressure 07/30/23   Nooruddin, Saad, MD  HYDROcodone -acetaminophen  (NORCO/VICODIN) 5-325 MG tablet Take 1 tablet by mouth every 6 (six) hours as needed for moderate pain (pain score 4-6). 11/26/23   Danton Lauraine LABOR, PA-C  VENTOLIN  HFA 108 (90 Base) MCG/ACT inhaler INHALE 2 PUFFS BY MOUTH EVERY 6 HOURS AS NEEDED FOR WHEEZING FOR SHORTNESS OF BREATH 03/07/24   Nooruddin, Saad, MD    Allergies: Benadryl [diphenhydramine hcl] and Peanut-containing drug products    Review of Systems  All other systems reviewed and are negative.   Updated Vital Signs BP 116/82   Pulse 89   Temp 98.8 F (37.1 C) (Oral)   Resp (!) 22   Ht 5' 7 (1.702 m)   Wt 117.5 kg   LMP 11/01/2019   SpO2 100%   BMI 40.57 kg/m   Physical  Exam Vitals and nursing note reviewed.  Constitutional:      General: She is not in acute distress.    Appearance: She is well-developed. She is obese.     Comments: Patient initially she was quite tearful and having trouble phonating and getting her words out.  However her speech improved throughout my examination.  HENT:     Head: Atraumatic.     Mouth/Throat:     Comments: No tongue swelling no mucosal edema no garbled speech Eyes:     Conjunctiva/sclera: Conjunctivae normal.  Neck:     Comments: Trachea midline Cardiovascular:     Rate and Rhythm: Normal  rate and regular rhythm.     Pulses: Normal pulses.     Heart sounds: Normal heart sounds.  Pulmonary:     Effort: Pulmonary effort is normal.     Breath sounds: No wheezing.  Abdominal:     Palpations: Abdomen is soft.     Tenderness: There is no abdominal tenderness.  Musculoskeletal:     Cervical back: Normal range of motion and neck supple.  Skin:    Findings: No rash.  Neurological:     Mental Status: She is alert and oriented to person, place, and time.     GCS: GCS eye subscore is 4. GCS verbal subscore is 5. GCS motor subscore is 6.     Cranial Nerves: Cranial nerves 2-12 are intact.     Sensory: Sensation is intact.     Motor: Motor function is intact.     Coordination: Coordination is intact.  Psychiatric:        Mood and Affect: Mood normal.     (all labs ordered are listed, but only abnormal results are displayed) Labs Reviewed - No data to display  EKG: None  Radiology: No results found.   Procedures   Medications Ordered in the ED  EPINEPHrine  (EPI-PEN) injection 0.3 mg (0.3 mg Intramuscular Given 04/13/24 1335)    Clinical Course as of 04/13/24 1521  Wed Apr 13, 2024  1519 Here with allergic reaction to peanuts, history of the same.  Received 2 shots of epinephrine .  Currently under observation.  May be discharged around 5:30 PM [AH]    Clinical Course User Index [AH] Harris, Abigail, PA-C                                 Medical Decision Making Risk Prescription drug management.   BP 116/82   Pulse 89   Temp 98.8 F (37.1 C) (Oral)   Resp (!) 22   Ht 5' 7 (1.702 m)   Wt 117.5 kg   LMP 11/01/2019   SpO2 100%   BMI 40.57 kg/m   2:71 PM  58 year old female with history of peanut allergies presenting with concerns of allergic reaction.  Patient report about an hour ago she went to a smoothie shop to get a smoothie he and she was getting a raw smoothie then have coconut in it.  After she took the smoothie she felt her tongue tingling  sensation and some tightness in her throat.  She then self administered an EpiPen  approximately an hour and 10 minutes ago.  Once arrived to the ER she received another EpiPen .  As she felt the throat tightness seems to be improving however she has noticed that she is having trouble forming her speech and stuttering which is new.  This causes great consternation.  She  does not endorse any lightheadedness or dizziness denies any skin itchiness no abdominal cramping and no wheezing.  She denies feeling bad before drinking her smoothie.  She denies any prior history of stroke.  She denies feeling upset prior to this event.  On exam, patient is laying in bed, tearful, stuttering speech initially but her speech improves during my visit.  She does not have any focal neurodeficit.  Her lungs otherwise clear throat exam unremarkable no tongue swelling abdomen soft nontender there is no wheezing there is no urticaria.  Suspect initial stuttering speech is likely stress-induced and less likely to be an acute stroke.  Patient will need to be monitored for at least 4 hours since she has received 2 EpiPen  shots in the past hour.  3:20 PM Patient signed out to oncoming provider who will monitor the patient for total 4 hours and reassess.  If improved patient may be discharged as appropriate.     Final diagnoses:  Allergic reaction, initial encounter    ED Discharge Orders     None          Nivia Colon, PA-C 04/13/24 1521    Doretha Folks, MD 04/17/24 2219

## 2024-04-15 ENCOUNTER — Other Ambulatory Visit: Payer: Self-pay

## 2024-04-15 NOTE — Patient Outreach (Signed)
 Complex Care Management   Visit Note  04/15/2024  Name:  Lynn Bond MRN: [id number] DOB: 02-03-1966  Situation: Referral received for Complex Care Management related to High Risk I obtained verbal consent from Parent.  Visit completed with Lynn Bond  on the phone, call was kept brief due to patient was driving and trying to pay attention to her GPS directions.  Main concern today was making sure she is carrying an EPI-pen to prepare for any future episodes of allergic reactions.  She was drinking a smoothie from a store, didn't realize they gave her the wrong ingredients, had a milk with tree nuts, she had to go to ED on 04/13/24 for treatment.   Background:   Past Medical History:  Diagnosis Date   Achilles tendinitis of left lower extremity 05/11/2019   Arthritis    Asthma    ECZEMA 03/09/2009   Qualifier: Diagnosis of  By: Tanda  MD, Valerie     Eczema 02/13/2015   GERD 03/09/2009   Qualifier: Diagnosis of  By: Tanda  MD, Valerie     Hypertension    Obesity, unspecified 10/11/2008   Qualifier: Diagnosis of   By: Tanda  MD, [place]         Pre-diabetes     Assessment: Patient Reported Symptoms:  Cognitive Cognitive Status: Alert and oriented to person, place, and time, Normal speech and language skills, Insightful and able to interpret abstract concepts      Neurological Neurological Review of Symptoms: Not assessed    HEENT HEENT Symptoms Reported: Not assessed      Cardiovascular Cardiovascular Symptoms Reported: Not assessed    Respiratory Respiratory Symptoms Reported: No symptoms reported Additional Respiratory Details: Takes albuterol  inhaler and nebulizer as needed, states she doesn't need often, just with certain weather changes.    Endocrine Endocrine Symptoms Reported: No symptoms reported (A1C 5.6% 01/29/2023) Is patient diabetic?: No Endocrine Self-Management Outcome: 5 (very good) Endocrine Comment: Takes Wegovy  for weight loss, currently taking  1.7mg  once weekly.  Gastrointestinal Gastrointestinal Symptoms Reported: Not assessed      Genitourinary Genitourinary Symptoms Reported: Not assessed    Integumentary Integumentary Symptoms Reported: Not assessed    Musculoskeletal Musculoskelatal Symptoms Reviewed: Not assessed        Psychosocial Psychosocial Symptoms Reported: Not assessed            12/10/2023    3:38 PM  Depression screen PHQ 2/9  Decreased Interest 0  Down, Depressed, Hopeless 0  PHQ - 2 Score 0    There were no vitals filed for this visit.  Medications Reviewed Today     Reviewed by Lucian Santana LABOR, RN (Registered Nurse) on 04/15/24 at 1023  Med List Status: <None>   Medication Order Taking? Sig Documenting Provider Last Dose Status Informant  albuterol  (PROVENTIL ) (2.5 MG/3ML) 0.083% nebulizer solution [id number] Yes Take 3 mLs (2.5 mg total) by nebulization every 4 (four) hours as needed for wheezing or shortness of breath. Addie Perkins, DO  Active   amLODipine  (NORVASC ) 10 MG tablet [id number] Yes Take 1 tablet (10 mg total) by mouth daily. Nooruddin, Saad, MD  Active Self, Pharmacy Records  cyclobenzaprine  (FLEXERIL ) 10 MG tablet [id number] Yes Take 10 mg by mouth daily as needed for muscle spasms. [provider]  Active Self, Pharmacy Records           Med Note SHAWNDELL, VARAS Nov 25, 2023 12:47 PM) >30 days  diclofenac  (VOLTAREN ) 75 MG EC tablet [id number]  Yes Take 75 mg by mouth 2 (two) times daily as needed for moderate pain (pain score 4-6). [provider]  Active Self, Pharmacy Records  EPINEPHrine  0.3 mg/0.3 mL IJ SOAJ injection [id number] Yes Inject 0.3 mg into the muscle as needed for anaphylaxis. Randol Simmonds, MD  Active Self, Pharmacy Records           Med Note EUGENIA, ELDREDGE Nov 25, 2023 12:47 PM) Has if needed.   EPINEPHrine  0.3 mg/0.3 mL IJ SOAJ injection [id number] Yes Inject 0.3 mg into the muscle as needed for anaphylaxis. Harris, Abigail, PA-C   Active   famotidine  (PEPCID ) 20 MG tablet [id number] Yes Take 1 tablet (20 mg total) by mouth 2 (two) times daily. Randol Simmonds, MD  Active Self, Pharmacy Records           Med Note ERIENNE, SPELMAN Nov 25, 2023 12:48 PM) >30 days.  fluticasone  (FLONASE ) 50 MCG/ACT nasal spray [id number] Yes Place 1 spray into both nostrils daily. Rosan Dayton BROCKS, DO  Active   hydrochlorothiazide  (HYDRODIURIL ) 25 MG tablet [id number] Yes Take 1 tablet (25 mg total) by mouth daily. for high blood pressure Nooruddin, Saad, MD  Active Self, Pharmacy Records  HYDROcodone -acetaminophen  (NORCO/VICODIN) 5-325 MG tablet [id number]  Take 1 tablet by mouth every 6 (six) hours as needed for moderate pain (pain score 4-6).  Patient not taking: Reported on 04/15/2024   Danton Lauraine LABOR, PA-C  Active   methylPREDNISolone  (MEDROL  DOSEPAK) 4 MG TBPK tablet [id number]  Use as directed  Patient not taking: Reported on 04/15/2024   Arloa Chroman, PA-C  Active   Semaglutide -Weight Management 1.7 MG/0.75ML SOAJ [id number] Yes Inject 1.7 mg into the skin every 7 (seven) days. [provider]  Active   VENTOLIN  HFA 108 (90 Base) MCG/ACT inhaler [id number] Yes INHALE 2 PUFFS BY MOUTH EVERY 6 HOURS AS NEEDED FOR WHEEZING FOR SHORTNESS OF BREATH Nooruddin, Saad, MD  Active             Recommendation:   -Discussed recent ED visit for allergic reaction, states she is currently carrying an Epi-pen with her when she goes out of the house to be prepared.   Follow Up Plan:   Telephone follow-up Tuesday, July 22nd at 3:30pm to finish assessment  Santana Stamp BSN, CCM [place]  [facility] Population Health RN Care Manager Direct Dial: [phone]  Fax: [phone]

## 2024-04-15 NOTE — Patient Instructions (Signed)
 Visit Information  [name] was given information about Medicaid Managed Care team care coordination services as a part of their [employer] Medicaid benefit. [name] verbally consentedto engagement with the [facility] Managed Care team.   If you are experiencing a medical emergency, please call 911 or report to your local emergency department or urgent care.   If you have a non-emergency medical problem during routine business hours, please contact your provider's office and ask to speak with a nurse.   For questions related to your [employer] [facility] Home health plan, please call: [phone]  OR visit the member homepage at: [email or web address]  If you would like to schedule transportation through your [facility] plan, please call the following number at least 2 days in advance of your appointment: [phone]  If you are experiencing a behavioral health crisis, call the [employer] [place]  Behavioral Health Crisis Line at [phone] [phone]). The line is available 24 hours a day, seven days a week.  If you would like help to quit smoking, call 1-800-QUIT-NOW (949-296-0584) OR Espaol: 1-855-Djelo-Ya (8-144-664-6430) o para ms informacin haga clic aqu or Text READY to 799-599 to register via text   [name] - following are the goals we discussed in your visit today:    Goals Addressed   None     Please see education materials related to Anaphylactic Reaction Adult provided by MyChart link.  Patient verbalizes understanding of instructions and care plan provided today and agrees to view in MyChart. Active MyChart status and patient understanding of how to access instructions and care plan via MyChart confirmed with patient.     Telephone follow up appointment with Managed Medicaid care management team member scheduled qnm:Ulzdijb, July 22nd at 3:30pm.   Santana Stamp BSN, CCM [place]  [facility] Population Health RN Care Manager Direct Dial: [phone]  Fax: [phone]   Following is a copy of your plan of care:  There are no care plans that you recently modified to display for this patient.

## 2024-04-19 ENCOUNTER — Other Ambulatory Visit: Payer: Self-pay

## 2024-04-19 NOTE — Patient Outreach (Addendum)
 Complex Care Management   Visit Note  04/19/2024  Name:  Lynn Bond MRN: [id number] DOB: [birth year]  Situation: Referral received for Complex Care Management related to Falls and Anaphylactic episodes. I obtained verbal consent from Patient.  Visit completed with Lynn Bond  on the phone. No concerns today.  She has had two ED visit for allergic reaction/anaphylaxis in six months, one fall in six months with right shoulder dislocation. Denies problems with medications.   Denies falls since February after her knee healed.  Denies any need for SDOH resources.   Background:   Past Medical History:  Diagnosis Date   Achilles tendinitis of left lower extremity 05/11/2019   Arthritis    Asthma    ECZEMA 03/09/2009   Qualifier: Diagnosis of  By: Tanda  MD, Valerie     Eczema 02/13/2015   GERD 03/09/2009   Qualifier: Diagnosis of  By: Tanda  MD, Valerie     Hypertension    Obesity, unspecified 10/11/2008   Qualifier: Diagnosis of   By: Tanda  MD, [place]         Pre-diabetes     Assessment: Patient Reported Symptoms:  Cognitive Cognitive Status: No symptoms reported, Alert and oriented to person, place, and time, Insightful and able to interpret abstract concepts, Normal speech and language skills      Neurological Neurological Review of Symptoms: No symptoms reported    HEENT HEENT Symptoms Reported: No symptoms reported HEENT Management Strategies: Routine screening HEENT Self-Management Outcome: 4 (good)    Cardiovascular Cardiovascular Symptoms Reported: No symptoms reported Does patient have uncontrolled Hypertension?: No Cardiovascular Management Strategies: Medication therapy Cardiovascular Self-Management Outcome: 4 (good)  Respiratory Respiratory Symptoms Reported: No symptoms reported Respiratory Management Strategies: Medication therapy Respiratory Self-Management Outcome: 4 (good)  Endocrine Endocrine Symptoms Reported: No symptoms reported     Gastrointestinal Gastrointestinal Symptoms Reported: No symptoms reported      Genitourinary Genitourinary Symptoms Reported: No symptoms reported    Integumentary Integumentary Symptoms Reported: No symptoms reported    Musculoskeletal     Falls in the past year?: Yes    Psychosocial       Quality of Family Relationships: helpful, supportive      04/19/2024    4:34 PM  Depression screen PHQ 2/9  Decreased Interest 0  Down, Depressed, Hopeless 0  PHQ - 2 Score 0    There were no vitals filed for this visit.  Medications Reviewed Today   Medications were not reviewed in this encounter     Recommendation:   Continue Current Plan of Care -Patient will using nebulizer and albuterol  inhaler for [facility] - will report SHOB that doesn't resolve after using meds, will refill meds before she runs out, and will call PCP for any problems relating to meds.  -Patient will keep Epi-pen with her at all times to use if needed for allergic reactions.  -Reminded patient to call customer service on back of [employer] card for benefits .   Follow Up Plan:   Telephone follow-up three weeks.  Santana Stamp BSN, CCM [place]  [facility] Population Health RN Care Manager Direct Dial: [phone]  Fax: [phone]

## 2024-04-19 NOTE — Patient Instructions (Signed)
 Visit Information  Ms. Kiger was given information about Medicaid Managed Care team care coordination services as a part of their [employer] Medicaid benefit. Richardson Delores Waddell verbally consentedto engagement with the [facility] Managed Care team.   If you are experiencing a medical emergency, please call 911 or report to your local emergency department or urgent care.   If you have a non-emergency medical problem during routine business hours, please contact your provider's office and ask to speak with a nurse.   For questions related to your [employer] [facility] health plan, please call: [phone]  OR visit the member homepage at: [email or web address]  If you would like to schedule transportation through your [employer] [facility] plan, please call the following number at least 2 days in advance of your appointment: [phone]  If you are experiencing a behavioral health crisis, call the [employer] [place]  Behavioral Health Crisis Line at [phone]). The line is available 24 hours a day, seven days a week.  If you would like help to quit smoking, call 1-800-QUIT-NOW ((831)030-4836) OR Espaol: 1-855-Djelo-Ya (8-144-664-6430) o para ms informacin haga clic aqu or Text READY to 799-599 to register via text   Ms. 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    Goal: Over the next 30 days the Patient will experience decrease in ED visits as evidenced by electronic medical record review; ED visits in last 6 months = 2  Interventions:   Falls Interventions: Reviewed medications and discussed potential side effects of medications such as dizziness and frequent  urination Advised patient of importance of notifying provider of falls Assessed for falls since last encounter Screening for signs and symptoms of depression related to chronic disease state  Assessed social determinant of health barriers  Patient Self-Care Activities:  Attend all scheduled provider appointments Call pharmacy for medication refills 3-7 days in advance of running out of medications Call provider office for new concerns or questions   Plan:  Telephone follow up appointment with care management team member scheduled for:  three weeks.              Patient verbalizes understanding of instructions and care plan provided today and agrees to view in MyChart. Active MyChart status and patient understanding of how to access instructions and care plan via MyChart confirmed with patient.     Telephone follow up appointment with Managed Medicaid care management team member scheduled qnm:Ulzdijb, August 12th at 3:30pm.   Santana Stamp BSN, CCM [place]  [facility] Population Health RN Care Manager Direct Dial: [phone]  Fax: [phone]   Following is a copy of your plan of care:  There are no care plans that you recently modified to display for this patient.

## 2024-05-02 DIAGNOSIS — R5383 Other fatigue: Secondary | ICD-10-CM | POA: Diagnosis not present

## 2024-05-02 DIAGNOSIS — Z6841 Body Mass Index (BMI) 40.0 and over, adult: Secondary | ICD-10-CM | POA: Diagnosis not present

## 2024-05-02 DIAGNOSIS — Z131 Encounter for screening for diabetes mellitus: Secondary | ICD-10-CM | POA: Diagnosis not present

## 2024-05-03 ENCOUNTER — Telehealth: Payer: Self-pay | Admitting: *Deleted

## 2024-05-03 NOTE — Telephone Encounter (Signed)
 Copied from CRM #[id number]. Topic: Clinical - Medical Advice >> May 03, 2024  9:19 AM Alfonso ORN wrote: Reason for CRM: patient need medical advice patient go to pain clinic and no longer need to go due to not in pain only reason go get the injections for losing weight , patient want to know if she can get the injections for losing weight with her provider at the [facility] instead   patient call back #  [phone]

## 2024-05-10 ENCOUNTER — Other Ambulatory Visit: Payer: Self-pay

## 2024-05-10 NOTE — Telephone Encounter (Signed)
 Copied from CRM [id number]. Topic: Clinical - Medication Question >> May 10, 2024  4:24 PM Adrianna P wrote: Reason for CRM: Santana slusher from [facility] called, patient wants to see if she can get start getting Wegovy  shots

## 2024-05-10 NOTE — Patient Instructions (Signed)
 Visit Information  [name] was given information about Medicaid Managed Care team care coordination services as a part of their [employer] Medicaid benefit.   If you would like to schedule transportation through your [facility] plan, please call the following number at least 2 days in advance of your appointment: [phone]  If you are experiencing a behavioral health crisis, call the [employer] [place]  Behavioral Health Crisis Line at [phone]). The line is available 24 hours a day, seven days a week.   Ms. 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    Patient Self-Care Activities:  Attend all scheduled provider appointments Call pharmacy for medication refills 3-7 days in advance of running out of medications Call provider office for new concerns or questions   Plan:  Telephone follow up appointment with care management team member scheduled for:  one month.               Patient verbalizes understanding of instructions and care plan provided today and agrees to view in MyChart. Active MyChart status and patient understanding of how  to access instructions and care plan via MyChart confirmed with patient.     Telephone follow up appointment with Managed Medicaid care management team member scheduled qnm:Ulzdijb, September 9th at 1:30pm.   Santana Stamp BSN, CCM [place]  [facility] Population Health RN Care Manager Direct Dial: [phone]  Fax: [phone]   Following is a copy of your plan of care:  There are no care plans that you recently modified to display for this patient.

## 2024-05-10 NOTE — Telephone Encounter (Signed)
 Talked to pt - informed pt she needs to schedule an appt to discuss this with the doctor about prescribing Wegovy . Stated she will call back in the am for an appt.

## 2024-05-10 NOTE — Patient Outreach (Signed)
 Complex Care Management   Visit Note  05/10/2024  Name:  Lynn Bond MRN: [id number] DOB: [birth year]  Situation: Referral received for Complex Care Management related to Falls I obtained verbal consent from Patient.  Visit completed with Lynn Bond  on the phone  Background:   Past Medical History:  Diagnosis Date   Achilles tendinitis of left lower extremity 05/11/2019   Arthritis    Asthma    ECZEMA 03/09/2009   Qualifier: Diagnosis of  By: Tanda  MD, Valerie     Eczema 02/13/2015   GERD 03/09/2009   Qualifier: Diagnosis of  By: Tanda  MD, Valerie     Hypertension    Obesity, unspecified 10/11/2008   Qualifier: Diagnosis of   By: Tanda  MD, Berwyn         Pre-diabetes     Assessment: Patient Reported Symptoms:  Cognitive Cognitive Status: No symptoms reported, Alert and oriented to person, place, and time, Normal speech and language skills      Neurological Neurological Review of Symptoms: No symptoms reported    HEENT HEENT Symptoms Reported: Not assessed      Cardiovascular Cardiovascular Symptoms Reported: No symptoms reported    Respiratory Respiratory Symptoms Reported: No symptoms reported    Endocrine Endocrine Symptoms Reported: No symptoms reported    Gastrointestinal Gastrointestinal Symptoms Reported: No symptoms reported      Genitourinary Genitourinary Symptoms Reported: No symptoms reported    Integumentary Integumentary Symptoms Reported: Not assessed    Musculoskeletal Musculoskelatal Symptoms Reviewed: No symptoms reported        Psychosocial Psychosocial Symptoms Reported: No symptoms reported            04/19/2024    4:34 PM  Depression screen PHQ 2/9  Decreased Interest 0  Down, Depressed, Hopeless 0  PHQ - 2 Score 0    There were no vitals filed for this visit.  Medications Reviewed Today   Medications were not reviewed in this encounter     Recommendation:   PCP Follow-up: Informed patient to call PCP for  appointment to discuss Wegovy  injections for weight loss.   Follow Up Plan:   Telephone follow-up in 1 month  Santana Stamp BSN, CCM   VBCI Population Health RN Care Manager Direct Dial: [phone]  Fax: [phone]

## 2024-06-02 DIAGNOSIS — M25561 Pain in right knee: Secondary | ICD-10-CM | POA: Diagnosis not present

## 2024-06-02 DIAGNOSIS — M25562 Pain in left knee: Secondary | ICD-10-CM | POA: Diagnosis not present

## 2024-06-02 DIAGNOSIS — I10 Essential (primary) hypertension: Secondary | ICD-10-CM | POA: Diagnosis not present

## 2024-06-02 DIAGNOSIS — Z6841 Body Mass Index (BMI) 40.0 and over, adult: Secondary | ICD-10-CM | POA: Diagnosis not present

## 2024-06-06 ENCOUNTER — Other Ambulatory Visit: Payer: Self-pay | Admitting: Student

## 2024-06-07 ENCOUNTER — Telehealth: Payer: Self-pay

## 2024-06-07 NOTE — Patient Instructions (Signed)
 Richardson Delores Birmingham - I am sorry I was unable to reach you today for our scheduled appointment. I work with Nooruddin, Saad, MD and am calling to support your healthcare needs. Please contact me at [phone] at your earliest convenience. I look forward to speaking with you soon.   Thank you,  Santana Stamp BSN, CCM [place]  [facility] Population Health RN Care Manager Direct Dial: [phone]  Fax: [phone]

## 2024-06-07 NOTE — Telephone Encounter (Signed)
 Medication sent to pharmacy

## 2024-06-20 ENCOUNTER — Other Ambulatory Visit: Payer: Self-pay | Admitting: Student

## 2024-06-20 DIAGNOSIS — I10 Essential (primary) hypertension: Secondary | ICD-10-CM

## 2024-06-20 NOTE — Telephone Encounter (Unsigned)
 Copied from CRM [id number]. Topic: Clinical - Medication Refill >> Jun 20, 2024 11:04 AM Susanna ORN wrote: Medication: amLODipine  (NORVASC ) 10 MG tablet & hydrochlorothiazide  (HYDRODIURIL ) 25 MG tablet  Has the patient contacted their pharmacy? No (Agent: If no, request that the patient contact the pharmacy for the refill. If patient does not wish to contact the pharmacy document the reason why and proceed with request.) (Agent: If yes, when and what did the pharmacy advise?)  This is the patient's preferred pharmacy:  [facility] - [place] ([place]), [place] - [address]  ([place]) [place] [address] Phone: [phone] Fax: [phone]  Is this the correct pharmacy for this prescription? Yes If no, delete pharmacy and type the correct one.   Has the prescription been filled recently? Yes  Is the patient out of the medication? Yes  Has the patient been seen for an appointment in the last year OR does the patient have an upcoming appointment? Yes  Can we respond through MyChart? Yes  Agent: Please be advised that Rx refills may take up to 3 business days. We ask that you follow-up with your pharmacy.

## 2024-06-20 NOTE — Telephone Encounter (Signed)
 Medication sent to pharmacy

## 2024-06-29 DIAGNOSIS — M25562 Pain in left knee: Secondary | ICD-10-CM | POA: Diagnosis not present

## 2024-06-29 DIAGNOSIS — M25561 Pain in right knee: Secondary | ICD-10-CM | POA: Diagnosis not present

## 2024-06-29 DIAGNOSIS — I10 Essential (primary) hypertension: Secondary | ICD-10-CM | POA: Diagnosis not present

## 2024-06-29 DIAGNOSIS — Z6841 Body Mass Index (BMI) 40.0 and over, adult: Secondary | ICD-10-CM | POA: Diagnosis not present

## 2024-07-18 ENCOUNTER — Ambulatory Visit (INDEPENDENT_AMBULATORY_CARE_PROVIDER_SITE_OTHER): Payer: Self-pay | Admitting: Student

## 2024-07-18 ENCOUNTER — Other Ambulatory Visit: Payer: Self-pay

## 2024-07-18 ENCOUNTER — Encounter: Payer: Self-pay | Admitting: Student

## 2024-07-18 VITALS — BP 126/78 | HR 80 | Temp 98.1°F | Ht 67.0 in | Wt 253.4 lb

## 2024-07-18 DIAGNOSIS — M1711 Unilateral primary osteoarthritis, right knee: Secondary | ICD-10-CM | POA: Diagnosis not present

## 2024-07-18 DIAGNOSIS — Z8249 Family history of ischemic heart disease and other diseases of the circulatory system: Secondary | ICD-10-CM | POA: Diagnosis not present

## 2024-07-18 DIAGNOSIS — Z6839 Body mass index (BMI) 39.0-39.9, adult: Secondary | ICD-10-CM

## 2024-07-18 DIAGNOSIS — Z79899 Other long term (current) drug therapy: Secondary | ICD-10-CM

## 2024-07-18 DIAGNOSIS — Z9889 Other specified postprocedural states: Secondary | ICD-10-CM | POA: Diagnosis not present

## 2024-07-18 DIAGNOSIS — Z0000 Encounter for general adult medical examination without abnormal findings: Secondary | ICD-10-CM

## 2024-07-18 DIAGNOSIS — J45909 Unspecified asthma, uncomplicated: Secondary | ICD-10-CM

## 2024-07-18 DIAGNOSIS — I10 Essential (primary) hypertension: Secondary | ICD-10-CM | POA: Diagnosis not present

## 2024-07-18 DIAGNOSIS — Z87891 Personal history of nicotine dependence: Secondary | ICD-10-CM

## 2024-07-18 DIAGNOSIS — J4530 Mild persistent asthma, uncomplicated: Secondary | ICD-10-CM

## 2024-07-18 DIAGNOSIS — E669 Obesity, unspecified: Secondary | ICD-10-CM

## 2024-07-18 DIAGNOSIS — R29818 Other symptoms and signs involving the nervous system: Secondary | ICD-10-CM | POA: Insufficient documentation

## 2024-07-18 DIAGNOSIS — R7303 Prediabetes: Secondary | ICD-10-CM | POA: Diagnosis not present

## 2024-07-18 DIAGNOSIS — Z23 Encounter for immunization: Secondary | ICD-10-CM | POA: Diagnosis not present

## 2024-07-18 LAB — POCT GLYCOSYLATED HEMOGLOBIN (HGB A1C): HbA1c, POC (prediabetic range): 5.1 % — AB (ref 5.7–6.4)

## 2024-07-18 LAB — GLUCOSE, CAPILLARY: Glucose-Capillary: 93 mg/dL (ref 70–99)

## 2024-07-18 MED ORDER — COVID-19 MRNA VAC-TRIS(PFIZER) 30 MCG/0.3ML IM SUSY
0.3000 mL | PREFILLED_SYRINGE | Freq: Once | INTRAMUSCULAR | 0 refills | Status: AC
  Filled 2024-07-18: qty 0.3, 1d supply, fill #0

## 2024-07-18 MED ORDER — HYDROCHLOROTHIAZIDE 25 MG PO TABS: 25.0000 mg | ORAL_TABLET | Freq: Every day | ORAL | 3 refills | Status: AC

## 2024-07-18 MED ORDER — AMLODIPINE BESYLATE 10 MG PO TABS: 10.0000 mg | ORAL_TABLET | Freq: Every day | ORAL | 3 refills | Status: AC

## 2024-07-18 NOTE — Assessment & Plan Note (Signed)
 Patient's BMI currently 39.69, most recent weight is 253.  She is continually working to eat better, and now that she is completely recovered from her knee surgery last year she is able to ambulate without problem, and has been exercising daily. Encouraged her today.

## 2024-07-18 NOTE — Progress Notes (Signed)
 Internal Medicine Clinic Attending  Case discussed with the resident at the time of the visit.  We reviewed the resident's history and exam and pertinent patient test results.  I agree with the assessment, diagnosis, and plan of care documented in the resident's note.

## 2024-07-18 NOTE — Assessment & Plan Note (Signed)
 Still uses her albuterol  inhaler intermittently, stable no changes.

## 2024-07-18 NOTE — Progress Notes (Signed)
 CC: Chronic condition follow-up  HPI:  [name] is a 58 y.o. female living with a history stated below and presents today for follow-up regarding her chronic conditions. Please see problem based assessment and plan for additional details.    Past Medical History:  Diagnosis Date   Achilles tendinitis of left lower extremity 05/11/2019   Allergic rhinitis 01/19/2009   Qualifier: Diagnosis of   By: Alvena MD, Ruben         Arthritis    Asthma    ECZEMA 03/09/2009   Qualifier: Diagnosis of  By: Tanda  MD, Valerie     Eczema 02/13/2015   Encounter for Papanicolaou smear for cervical cancer screening 01/12/2024   GERD 03/09/2009   Qualifier: Diagnosis of  By: Tanda  MD, Valerie     Hypertension    Obesity, unspecified 10/11/2008   Qualifier: Diagnosis of   By: Tanda  MD, Valerie         Osteoarthritis of both knees 12/09/2017   PES PLANUS, 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 75 mL 2   EPINEPHrine  0.3 mg/0.3 mL IJ SOAJ injection Inject 0.3 mg into the muscle as needed for anaphylaxis. 1 each 0   EPINEPHrine  0.3 mg/0.3 mL IJ SOAJ injection Inject 0.3 mg into the muscle as needed for anaphylaxis. 1 each 0   fluticasone  (FLONASE ) 50 MCG/ACT nasal spray Place 1 spray into both nostrils daily. 16 g 2   VENTOLIN  HFA 108 (90 Base) MCG/ACT inhaler INHALE 2 PUFFS BY MOUTH EVERY 6 HOURS AS NEEDED FOR WHEEZING OR SHORTNESS OF BREATH 18 g 3   No current facility-administered medications on file prior to visit.    Family History  Problem Relation Age of Onset   Hypertension Mother    Cancer Father     Colon polyps Brother    Colon cancer Neg Hx    Esophageal cancer Neg Hx    Prostate cancer Neg Hx    Rectal cancer Neg Hx     Social History   Socioeconomic History   Marital status: Married    Spouse name: Not on file   Number of children: Not on file   Years of education: Not on file   Highest education level: Not on file  Occupational History   Not on file  Tobacco Use   Smoking status: Former    Current packs/day: 0.00    Types: Cigarettes    Quit date: 10/20/1988    Years since quitting: 35.7   Smokeless tobacco: Never   Tobacco comments:    quit 69yrs ago  Vaping Use   Vaping status: Never Used  Substance and Sexual Activity   Alcohol use: No    Alcohol/week: 0.0 standard drinks of alcohol   Drug use: No   Sexual activity: Yes  Other Topics Concern   Not on file  Social History Narrative   Works at [employer]    Social Drivers of Health   Financial Resource Strain: Low Risk  (11/05/2022)   Overall Financial Resource Strain (CARDIA)    Difficulty of Paying  Living Expenses: Not hard at all  Food Insecurity: No Food Insecurity (11/25/2023)   Hunger Vital Sign    Worried About Running Out of Food in the Last Year: Never true    Ran Out of Food in the Last Year: Never true  Transportation Needs: No Transportation Needs (11/25/2023)   PRAPARE - Administrator, Civil Service (Medical): No    Lack of Transportation (Non-Medical): No  Physical Activity: Inactive (11/05/2022)   Exercise Vital Sign    Days of Exercise per Week: 0 days    Minutes of Exercise per Session: 0 min  Stress: No Stress Concern Present (11/05/2022)   [employer] of Occupational Health - Occupational Stress Questionnaire    Feeling of Stress : Not at all  Social Connections: Moderately Integrated (11/25/2023)   Social Connection and Isolation Panel    Frequency of Communication with Friends and Family: More than three times a week    Frequency of Social Gatherings with  Friends and Family: More than three times a week    Attends Religious Services: More than 4 times per year    Active Member of [employer] or Organizations: No    Attends Banker Meetings: Never    Marital Status: Married  Catering manager Violence: Not At Risk (11/25/2023)   Humiliation, Afraid, Rape, and Kick questionnaire    Fear of Current or Ex-Partner: No    Emotionally Abused: No    Physically Abused: No    Sexually Abused: No    Review of Systems: ROS negative except for what is noted on the assessment and plan.  Vitals:   07/18/24 0837  BP: 126/78  Pulse: 80  Temp: 98.1 F (36.7 C)  TempSrc: Oral  SpO2: 100%  Weight: 253 lb 6.4 oz (114.9 kg)  Height: 5' 7 (1.702 m)    Physical Exam: Constitutional: well-appearing female in no acute distress Cardiovascular: regular rate and rhythm, no m/r/g Pulmonary/Chest: normal work of breathing on room air, lungs clear to auscultation bilaterally Abdominal: soft, non-tender, non-distended  Assessment & Plan:   Pre-diabetes A1c currently at 5.1, not in prediabetic range anymore.  Essential hypertension, benign Blood pressure well-controlled at 126/78.  Will continue her hydrochlorothiazide  25 mg and amlodipine  10 mg.  Healthcare maintenance She received her flu shot at [place] last month  Vaccine for streptococcus pneumoniae and influenza Strep vaccine given today, she received her influenza shot at [facility] last month  Suspected sleep apnea STOP-BANG score elevated at 3, with treatment of hypertension as well as snoring at night, indicating high risk for sleep apnea. Will send for sleep study for further evaluation, if positive she would benefit from Zepbound initation.   Obesity (BMI 30-39.9) Patient's BMI currently 39.69, most recent weight is 253.  She is continually working to eat better, and now that she is completely recovered from her knee surgery last year she is able to ambulate without problem, and has  been exercising daily. Encouraged her today.  Osteoarthritis of right knee S/p repair last year with right knee arthroscopy, will resolve.   Asthma Still uses her albuterol  inhaler intermittently, stable no changes.    Patient discussed with Dr. Machen  Ysabel Cowgill, M.D. [facility] Health Internal Medicine, PGY-3 Pager: [phone] Date 07/18/2024 Time 10:00 AM

## 2024-07-18 NOTE — Assessment & Plan Note (Signed)
 Strep vaccine given today, she received her influenza shot at [place] last month

## 2024-07-18 NOTE — Assessment & Plan Note (Addendum)
 STOP-BANG score elevated at 3, with treatment of hypertension as well as snoring at night, indicating high risk for sleep apnea. Will send for sleep study for further evaluation, if positive she would benefit from Zepbound initation.

## 2024-07-18 NOTE — Assessment & Plan Note (Signed)
 She received her flu shot at [place] last month

## 2024-07-18 NOTE — Patient Instructions (Signed)
 Thank you so much for coming to the clinic today!   I have refilled your blood pressure medications  I have put in for a sleep study, which can be done at your convenience  Please stop taking phentermine  Keep working on dieting and exercising! You're doing great!   If you have any questions please feel free to the call the clinic at anytime at [phone]. It was a pleasure seeing you!  Best, Dr. Anayah Arvanitis

## 2024-07-18 NOTE — Assessment & Plan Note (Signed)
 S/p repair last year with right knee arthroscopy, will resolve.

## 2024-07-18 NOTE — Assessment & Plan Note (Signed)
 Blood pressure well-controlled at 126/78.  Will continue her hydrochlorothiazide  25 mg and amlodipine  10 mg.

## 2024-07-18 NOTE — Assessment & Plan Note (Signed)
 A1c currently at 5.1, not in prediabetic range anymore.

## 2024-08-01 ENCOUNTER — Telehealth: Payer: Self-pay | Admitting: *Deleted

## 2024-08-01 NOTE — Telephone Encounter (Signed)
 Copied from CRM #[id number]. Topic: Referral - Request for Referral >> Jul 29, 2024  1:45 PM Miquel SAILOR wrote: Did the patient discuss referral with their provider in the last year? Yes (If No - schedule appointment) (If Yes - send message)  Appointment offered? No seen 10/20  Type of order/referral and detailed reason for visit: Weight management for losing weight Needs call back when updated [phone]  Preference of office, provider, location: n/a  If referral order, have you been seen by this specialty before? No (If Yes, this issue or another issue? When? Where?  Can we respond through MyChart? Yes

## 2024-08-01 NOTE — Addendum Note (Signed)
 Addended by: Vanesa Renier on: 08/01/2024 04:22 PM   Modules accepted: Orders

## 2024-08-10 ENCOUNTER — Encounter (INDEPENDENT_AMBULATORY_CARE_PROVIDER_SITE_OTHER): Payer: Self-pay

## 2024-08-29 ENCOUNTER — Other Ambulatory Visit: Payer: Self-pay

## 2024-08-29 ENCOUNTER — Ambulatory Visit: Payer: Self-pay | Admitting: Student

## 2024-08-29 ENCOUNTER — Telehealth: Payer: Self-pay | Admitting: Student

## 2024-08-29 DIAGNOSIS — J4530 Mild persistent asthma, uncomplicated: Secondary | ICD-10-CM

## 2024-08-29 MED ORDER — ALBUTEROL SULFATE (2.5 MG/3ML) 0.083% IN NEBU: 2.5000 mg | INHALATION_SOLUTION | RESPIRATORY_TRACT | 2 refills | Status: DC | PRN

## 2024-08-29 MED ORDER — VENTOLIN HFA 108 (90 BASE) MCG/ACT IN AERS: 2.0000 | INHALATION_SPRAY | Freq: Four times a day (QID) | RESPIRATORY_TRACT | 3 refills | Status: DC | PRN

## 2024-08-29 NOTE — Telephone Encounter (Unsigned)
 Copied from CRM #[id number]. Topic: Clinical - Medication Refill >> Aug 29, 2024 10:17 AM Zane F wrote: Patient is calling in to have the following prescriptions refilled after being told by her pharmacy she has no refills. Patient is out of both of them and has been using an emergency inhaler to help with symptoms.  Callback Number: [phone]  Medication:   1. albuterol  (PROVENTIL ) (2.5 MG/3ML) 0.083% nebulizer solution 2. VENTOLIN  HFA 108 (90 Base) MCG/ACT inhaler  Has the patient contacted their pharmacy? Yes   This is the patient's preferred pharmacy:  [facility] - [place] ([place]), [place] - [address] [place] ([place]) [place] [address] Phone: [phone] Fax: [phone]   Is this the correct pharmacy for this prescription? Yes   Has the prescription been filled recently? No  Is the patient out of the medication? Yes  Has the patient been seen for an appointment in the last year OR does the patient have an upcoming appointment? Yes  Can we respond through MyChart? No, patient would rather a call.  Agent: Please be advised that Rx refills may take up to 3 business days. We ask that you follow-up with your pharmacy.

## 2024-08-29 NOTE — Telephone Encounter (Signed)
 FYI Only or Action Required?: Action required by provider: medication refill request. Ventolin  and Proventil   Patient was last seen in primary care on 07/18/2024 by Nooruddin, Saad, MD.  Called Nurse Triage reporting Breathing Problem.  Symptoms began a week ago.  Interventions attempted: OTC medications: OTC inhaler.  Symptoms are: unchanged.  Triage Disposition: No disposition on file.  Patient/caregiver understands and will follow disposition?: Yes  Copied from CRM #[id number]. Topic: Clinical - Red Word Triage >> Aug 29, 2024 10:22 AM Zane F wrote: Red Word that prompted transfer to Nurse Triage:   Concern: Shortness of breath (Patient has been without her prescriptions since 08/22/2024); intermittent chest tightness  Symptoms:  Shortness of breath History of asthma Chest tightness occurs when the patient has to walk for an extended period of time.    When did the symptoms start?: 08/22/2024   What have you done to aid in the concern ? Have you taken anything to assist with the matter?: Yes    If so, what did you take?:Emergency inhaler that she bought from [place]    Wanted to let you know I will be transferring you to further discuss your concern. Please be advised the nurse can assist with scheduling. Answer Assessment - Initial Assessment Questions Patient calling for refill of Ventolin . States she can get SOB when she walks outside when the wind is on her face. Can have some chest tightness at those times briefly.  1. RESPIRATORY STATUS: Describe your breathing? (e.g., wheezing, shortness of breath, unable to speak, severe coughing)      SOB if walking outside due to cold air.  2. ONSET: When did this breathing problem begin?      1 week ago. 3. PATTERN Does the difficult breathing come and go, or has it been constant since it started?      Comes and goes when she is outside. 4. SEVERITY: How bad is your breathing? (e.g., mild, moderate, severe)       mild 5. RECURRENT SYMPTOM: Have you had difficulty breathing before? If Yes, ask: When was the last time? and What happened that time?      Yes, happens every winter for the last several years. 6. CARDIAC HISTORY: Do you have any history of heart disease? (e.g., heart attack, angina, bypass surgery, angioplasty)      Denies 7. LUNG HISTORY: Do you have any history of lung disease?  (e.g., pulmonary embolus, asthma, emphysema)     Asthma 8. CAUSE: What do you think is causing the breathing problem?      Happens every year due to cold weather. 9. OTHER SYMPTOMS: Do you have any other symptoms? (e.g., chest pain, cough, dizziness, fever, runny nose)     Denies  Protocols used: Breathing Difficulty-A-AH

## 2024-08-30 MED ORDER — VENTOLIN HFA 108 (90 BASE) MCG/ACT IN AERS: 2.0000 | INHALATION_SPRAY | Freq: Four times a day (QID) | RESPIRATORY_TRACT | 3 refills | Status: AC | PRN

## 2024-08-30 MED ORDER — ALBUTEROL SULFATE (2.5 MG/3ML) 0.083% IN NEBU: 2.5000 mg | INHALATION_SOLUTION | RESPIRATORY_TRACT | 2 refills | Status: AC | PRN

## 2024-10-03 ENCOUNTER — Telehealth: Payer: Self-pay

## 2024-10-03 ENCOUNTER — Other Ambulatory Visit: Payer: Self-pay

## 2024-10-03 NOTE — Patient Outreach (Signed)
 Contacted [facility], spoke with staff, [place], to request referral for Sleep Study to be resent (review of referral dated 07/18/24, notes state, This has been placed as a direct sleep study order, our office does not accept direct sleep study orders. Please have this resent as a referral for a sleep consultation.  Staff will send message to ordering MD and will contact patient for update.

## 2024-10-03 NOTE — Patient Instructions (Signed)
 Visit Information  [name] was given information about Medicaid Managed Care team care coordination services as a part of their [employer] Medicaid benefit.   If you would like to schedule transportation through your [employer] [facility] plan, please call the following number at least 2 days in advance of your appointment: [phone]  If you are experiencing a behavioral health crisis, call the [employer] [place]  Behavioral Health Crisis Line at [phone]). The line is available 24 hours a day, seven days a week.   Patient verbalizes understanding of instructions and care plan provided today and agrees to view in MyChart. Active MyChart status and patient understanding of how to access instructions and care plan via MyChart confirmed with patient.     Telephone follow up appointment with Managed Medicaid care management team member scheduled qnm:[id number], January 12th at 10:30am.   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  UPDATE 10/03/24: Denies  falls.   Patient Self-Care Activities:  Attend all scheduled provider appointments Call pharmacy for medication refills 3-7 days in advance of running out of medications Call provider office for new concerns or questions   Plan:  The patient has been provided with contact information for the care management team and has been advised to call with any health related questions or concerns.

## 2024-10-03 NOTE — Patient Outreach (Signed)
 Complex Care Management   Visit Note  10/03/2024  Name:  Lynn Bond MRN: [id number] DOB: 01-31-1966  Situation: Referral received for Complex Care Management related to Falls. I obtained verbal consent from Patient.  Visit completed with Lynn Bond  on the phone.  Patient states PCP was supposed to help her with weigh loss.  Review of notes in EPIC show a letter was sent to patient 08/10/24 by [facility] to call and make appointment.  She also states PCP needs to re-refer her to [facility] due to wrong verbiage sent in referral dated 07/18/24.  .   Background:   Past Medical History:  Diagnosis Date   Achilles tendinitis of left lower extremity 05/11/2019   Allergic rhinitis 01/19/2009   Qualifier: Diagnosis of   By: Alvena MD, Ruben         Arthritis    Asthma    ECZEMA 03/09/2009   Qualifier: Diagnosis of  By: Tanda  MD, Valerie     Eczema 02/13/2015   Encounter for Papanicolaou smear for cervical cancer screening 01/12/2024   GERD 03/09/2009   Qualifier: Diagnosis of  By: Tanda  MD, Valerie     Hypertension    Obesity, unspecified 10/11/2008   Qualifier: Diagnosis of   By: Tanda  MD, Valerie         Osteoarthritis of both knees 12/09/2017   PES PLANUS, 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   Qualifier: Diagnosis of   By: Rosalynn MD, Camie         Pre-diabetes    Shoulder dislocation, right, initial encounter 11/25/2023    Assessment: Patient Reported Symptoms:  Cognitive Cognitive Status: Alert and oriented to person, place, and time, Insightful and able to interpret abstract concepts, Normal speech and language skills      Neurological Neurological Review of Symptoms: Not assessed    HEENT HEENT Symptoms Reported: Not assessed      Cardiovascular Cardiovascular Symptoms Reported: Not assessed    Respiratory Respiratory Symptoms Reported: Not assesed    Endocrine Endocrine Symptoms Reported: Not assessed    Gastrointestinal  Gastrointestinal Symptoms Reported: Not assessed      Genitourinary Genitourinary Symptoms Reported: Not assessed    Integumentary Integumentary Symptoms Reported: Not assessed    Musculoskeletal Musculoskelatal Symptoms Reviewed: Not assessed        Psychosocial Psychosocial Symptoms Reported: Not assessed          10/03/2024    PHQ2-9 Depression Screening   Little interest or pleasure in doing things    Feeling down, depressed, or hopeless    PHQ-2 - Total Score    Trouble falling or staying asleep, or sleeping too much    Feeling tired or having little energy    Poor appetite or overeating     Feeling bad about yourself - or that you are a failure or have let yourself or your family down    Trouble concentrating on things, such as reading the newspaper or watching television    Moving or speaking so slowly that other people could have noticed.  Or the opposite - being so fidgety or restless that you have been moving around a lot more than usual    Thoughts that you would be better off dead, or hurting yourself in some way    PHQ2-9 Total Score    If you checked off any problems, how difficult have these problems made it for you to do your work, take care of things  at home, or get along with other people    Depression Interventions/Treatment      There were no vitals filed for this visit.    Medications Reviewed Today   Medications were not reviewed in this encounter     Recommendation:   This RNCM called [facility] to request another referral to [facility].    Patient provided contact information to [facility] to call for appt.   Follow Up Plan:   Telephone follow-up one week  Santana Stamp BSN, CCM [place]  [facility] Population Health RN Care Manager Direct Dial: [phone]  Fax: [phone]

## 2024-10-04 DIAGNOSIS — R29818 Other symptoms and signs involving the nervous system: Secondary | ICD-10-CM

## 2024-10-04 NOTE — Progress Notes (Signed)
 Corrected order for sleep study to referral for sleep consultation.  Ozell Kung MD 10/04/2024, 10:40 AM

## 2024-10-10 ENCOUNTER — Telehealth: Payer: Self-pay | Admitting: Neurology

## 2024-10-10 ENCOUNTER — Other Ambulatory Visit: Payer: Self-pay

## 2024-10-10 NOTE — Telephone Encounter (Signed)
 Pt is calling to set up her sleep consult appointment. Best call back # is [phone]

## 2024-10-10 NOTE — Patient Instructions (Signed)
 Visit Information  Lynn Bond was given information about Medicaid Managed Care team care coordination services as a part of their [employer] Medicaid benefit.   If you would like to schedule transportation through your [employer] [facility] plan, please call the following number at least 2 days in advance of your appointment: [phone]  If you are experiencing a behavioral health crisis, call the [employer] [place]  Behavioral Health Crisis Line at [phone]). The line is available 24 hours a day, seven days a week.    Care plan and visit instructions communicated with the patient verbally today. Patient agrees to receive a copy in MyChart. Active MyChart status and patient understanding of how to access instructions and care plan via MyChart confirmed with patient.     Telephone follow up appointment with Managed Medicaid care management team member scheduled for: Monday, February 2nd at 10:30am.   Santana Stamp BSN, CCM [place]  [facility] Population Health RN Care Manager Direct Dial: [phone]  Fax: [phone]   Following is a copy of your plan of care:   Goals Addressed             This Visit's Progress    VBCI RN Care Plan

## 2024-10-10 NOTE — Patient Outreach (Signed)
 Chart review to make sure PCP office received message from this [facility] regarding [facility] needed a sleep study referral resent with the correct order.  Contacted Lynn Bond to relay the information, provided contact number to [facility] [phone].

## 2024-10-13 ENCOUNTER — Encounter (INDEPENDENT_AMBULATORY_CARE_PROVIDER_SITE_OTHER): Payer: Self-pay

## 2024-10-31 ENCOUNTER — Other Ambulatory Visit: Payer: Self-pay

## 2024-10-31 NOTE — Patient Outreach (Signed)
 Complex Care Management   Visit Note  10/31/2024  Name:  Lynn Bond MRN: [id number] DOB: 06-Jan-1966  Situation: Referral received for Complex Care Management related to Falls. I obtained verbal consent from Patient.  Visit completed with Lynn Bond  on the phone.  Patient is trying to obtain weight loss with GLP-1 and plans to make appointment with [facility]. She has decided not to pursue appointment with Sleep Study clinic at this time.   Background:   Past Medical History:  Diagnosis Date   Achilles tendinitis of left lower extremity 05/11/2019   Allergic rhinitis 01/19/2009   Qualifier: Diagnosis of   By: Alvena MD, Ruben         Arthritis    Asthma    ECZEMA 03/09/2009   Qualifier: Diagnosis of  By: Tanda  MD, Valerie     Eczema 02/13/2015   Encounter for Papanicolaou smear for cervical cancer screening 01/12/2024   GERD 03/09/2009   Qualifier: Diagnosis of  By: Tanda  MD, Valerie     Hypertension    Obesity, unspecified 10/11/2008   Qualifier: Diagnosis of   By: Tanda  MD, Valerie         Osteoarthritis of both knees 12/09/2017   PES PLANUS, 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   Qualifier: Diagnosis of   By: Rosalynn MD, Camie         Pre-diabetes    Shoulder dislocation, right, initial encounter 11/25/2023    Assessment: Patient Reported Symptoms:  Cognitive Cognitive Status: Alert and oriented to person, place, and time, Insightful and able to interpret abstract concepts, Normal speech and language skills, No symptoms reported      Neurological Neurological Review of Symptoms: Not assessed    HEENT HEENT Symptoms Reported: Not assessed      Cardiovascular Cardiovascular Symptoms Reported: Not assessed    Respiratory Respiratory Symptoms Reported: No symptoms reported    Endocrine Endocrine Symptoms Reported: Not assessed Is patient diabetic?: No    Gastrointestinal Gastrointestinal Symptoms Reported: Not assessed      Genitourinary  Genitourinary Symptoms Reported: Not assessed    Integumentary Integumentary Symptoms Reported: Not assessed    Musculoskeletal Musculoskelatal Symptoms Reviewed: No symptoms reported        Psychosocial Psychosocial Symptoms Reported: Not assessed          10/31/2024    PHQ2-9 Depression Screening   Little interest or pleasure in doing things    Feeling down, depressed, or hopeless    PHQ-2 - Total Score    Trouble falling or staying asleep, or sleeping too much    Feeling tired or having little energy    Poor appetite or overeating     Feeling bad about yourself - or that you are a failure or have let yourself or your family down    Trouble concentrating on things, such as reading the newspaper or watching television    Moving or speaking so slowly that other people could have noticed.  Or the opposite - being so fidgety or restless that you have been moving around a lot more than usual    Thoughts that you would be better off dead, or hurting yourself in some way    PHQ2-9 Total Score    If you checked off any problems, how difficult have these problems made it for you to do your work, take care of things at home, or get along with other people    Depression Interventions/Treatment      There  were no vitals filed for this visit. Pain Scale: 0-10 Pain Score: 0-No pain  MEDICATIONS: Patient denies problems with refills, taking as prescribed.    Recommendation:   Continue Current Plan of Care  Follow Up Plan:   Telephone follow-up in 1 month  Santana Stamp BSN, CCM [place]  [facility] Population Health RN Care Manager Direct Dial: [phone]  Fax: [phone]

## 2024-10-31 NOTE — Patient Instructions (Signed)
 Visit Information  Ms. Patalano was given information about Medicaid Managed Care team care coordination services as a part of their [employer] Medicaid benefit.   If you would like to schedule transportation through your [employer] [facility] plan, please call the following number at least 2 days in advance of your appointment: [phone]  If you are experiencing a behavioral health crisis, call the [employer] [place]  Behavioral Health Crisis Line at [phone]). The line is available 24 hours a day, seven days a week.   Care plan and visit instructions communicated with the patient verbally today. Patient agrees to receive a copy in MyChart. Active MyChart status and patient understanding of how to access instructions and care plan via MyChart confirmed with patient.     Telephone follow up appointment with Managed Medicaid care management team member scheduled for: Monday, March 2nd at 10:00am.  Santana Stamp BSN, CCM [place]  [facility] Population Health RN Care Manager Direct Dial: [phone]  Fax: [phone]   Following is a copy of your plan of care:   Goals Addressed             This Visit's Progress    VBCI RN Care Plan   On track    Problems:  Chronic Disease Management support and education needs related to suspected sleep apnea  Goal: Over the next 30 days the Patient will demonstrate ongoing self health care management ability for suspected sleep apnea as evidenced by    attending appointment with [facility]   Interventions:   Evaluation of current treatment plan related to suspected sleep apnea,  self-management and patient's adherence to plan as established by provider. Discussed plans with patient for ongoing care management follow up and provided patient with direct contact information for care management team Advised patient to call [facility] to  set up appt.  This RNCM reviewed PCP OV/referral, found the referral didn't go through due to wrong order, called PCP office to resend referral.  Referral resent.   UPDATE 10/31/24: Referral went through, Sleep Study office sent message to patient to set up appt.  Patient has decided to go back to [facility] to possibly obtain GLP-1 instead of going to sleep study. She will go to the office to pay the $99 program fee when weather permits.  Patient Self-Care Activities:  Call provider office for new concerns or questions  Take medications as prescribed    Plan:  The patient has been provided with contact information for the care management team and has been advised to call with any health related questions or concerns.

## 2024-11-28 ENCOUNTER — Telehealth
